# Patient Record
Sex: Female | Born: 1960 | Race: White | Hispanic: No | Marital: Married | Smoking: Never smoker
Health system: Southern US, Community
[De-identification: ages and names within clinical notes are randomized; demographics above are authoritative.]

## PROBLEM LIST (undated history)

## (undated) DIAGNOSIS — B029 Zoster without complications: Secondary | ICD-10-CM

## (undated) DIAGNOSIS — R42 Dizziness and giddiness: Secondary | ICD-10-CM

## (undated) DIAGNOSIS — C50919 Malignant neoplasm of unspecified site of unspecified female breast: Secondary | ICD-10-CM

## (undated) DIAGNOSIS — R87619 Unspecified abnormal cytological findings in specimens from cervix uteri: Secondary | ICD-10-CM

## (undated) DIAGNOSIS — R7309 Other abnormal glucose: Secondary | ICD-10-CM

## (undated) DIAGNOSIS — IMO0002 Reserved for concepts with insufficient information to code with codable children: Secondary | ICD-10-CM

## (undated) DIAGNOSIS — Z800 Family history of malignant neoplasm of digestive organs: Secondary | ICD-10-CM

## (undated) DIAGNOSIS — G43909 Migraine, unspecified, not intractable, without status migrainosus: Secondary | ICD-10-CM

## (undated) DIAGNOSIS — K222 Esophageal obstruction: Secondary | ICD-10-CM

## (undated) DIAGNOSIS — M26609 Unspecified temporomandibular joint disorder, unspecified side: Secondary | ICD-10-CM

## (undated) DIAGNOSIS — K219 Gastro-esophageal reflux disease without esophagitis: Secondary | ICD-10-CM

## (undated) DIAGNOSIS — M8580 Other specified disorders of bone density and structure, unspecified site: Secondary | ICD-10-CM

## (undated) DIAGNOSIS — M810 Age-related osteoporosis without current pathological fracture: Secondary | ICD-10-CM

## (undated) HISTORY — DX: Migraine, unspecified, not intractable, without status migrainosus: G43.909

## (undated) HISTORY — DX: Unspecified temporomandibular joint disorder, unspecified side: M26.609

## (undated) HISTORY — PX: GYNECOLOGIC CRYOSURGERY: SHX857

## (undated) HISTORY — DX: Dizziness and giddiness: R42

## (undated) HISTORY — DX: Unspecified abnormal cytological findings in specimens from cervix uteri: R87.619

## (undated) HISTORY — DX: Age-related osteoporosis without current pathological fracture: M81.0

## (undated) HISTORY — PX: WISDOM TOOTH EXTRACTION: SHX21

## (undated) HISTORY — DX: Other specified disorders of bone density and structure, unspecified site: M85.80

## (undated) HISTORY — DX: Zoster without complications: B02.9

## (undated) HISTORY — DX: Malignant neoplasm of unspecified site of unspecified female breast: C50.919

## (undated) HISTORY — DX: Esophageal obstruction: K22.2

## (undated) HISTORY — PX: ESOPHAGEAL DILATION: SHX303

## (undated) HISTORY — DX: Reserved for concepts with insufficient information to code with codable children: IMO0002

## (undated) HISTORY — DX: Family history of malignant neoplasm of digestive organs: Z80.0

## (undated) HISTORY — DX: Other abnormal glucose: R73.09

---

## 1989-10-12 HISTORY — PX: DILATION AND CURETTAGE OF UTERUS: SHX78

## 1990-10-12 HISTORY — PX: LAPAROSCOPIC CHOLECYSTECTOMY: SUR755

## 1997-12-28 ENCOUNTER — Other Ambulatory Visit: Admission: RE | Admit: 1997-12-28 | Discharge: 1997-12-28 | Payer: Self-pay | Admitting: Obstetrics and Gynecology

## 2002-04-19 ENCOUNTER — Other Ambulatory Visit: Admission: RE | Admit: 2002-04-19 | Discharge: 2002-04-19 | Payer: Self-pay | Admitting: Obstetrics and Gynecology

## 2003-05-24 ENCOUNTER — Other Ambulatory Visit: Admission: RE | Admit: 2003-05-24 | Discharge: 2003-05-24 | Payer: Self-pay | Admitting: Obstetrics and Gynecology

## 2004-06-17 ENCOUNTER — Other Ambulatory Visit: Admission: RE | Admit: 2004-06-17 | Discharge: 2004-06-17 | Payer: Self-pay | Admitting: Obstetrics and Gynecology

## 2005-08-06 ENCOUNTER — Other Ambulatory Visit: Admission: RE | Admit: 2005-08-06 | Discharge: 2005-08-06 | Payer: Self-pay | Admitting: Obstetrics and Gynecology

## 2005-08-24 ENCOUNTER — Encounter: Admission: RE | Admit: 2005-08-24 | Discharge: 2005-08-24 | Payer: Self-pay | Admitting: Obstetrics and Gynecology

## 2005-08-28 ENCOUNTER — Ambulatory Visit (HOSPITAL_COMMUNITY): Admission: RE | Admit: 2005-08-28 | Discharge: 2005-08-28 | Payer: Self-pay | Admitting: Gastroenterology

## 2008-02-23 ENCOUNTER — Other Ambulatory Visit: Admission: RE | Admit: 2008-02-23 | Discharge: 2008-02-23 | Payer: Self-pay | Admitting: Obstetrics & Gynecology

## 2010-09-26 ENCOUNTER — Encounter
Admission: RE | Admit: 2010-09-26 | Discharge: 2010-09-26 | Payer: Self-pay | Source: Home / Self Care | Attending: Gastroenterology | Admitting: Gastroenterology

## 2010-10-12 HISTORY — PX: BREAST BIOPSY: SHX20

## 2010-11-01 ENCOUNTER — Encounter: Payer: Self-pay | Admitting: Gastroenterology

## 2011-02-18 ENCOUNTER — Other Ambulatory Visit: Payer: Self-pay | Admitting: Chiropractic Medicine

## 2011-02-18 DIAGNOSIS — M25511 Pain in right shoulder: Secondary | ICD-10-CM

## 2011-02-21 ENCOUNTER — Ambulatory Visit
Admission: RE | Admit: 2011-02-21 | Discharge: 2011-02-21 | Disposition: A | Discharge status: Home / Self Care | Payer: BC Managed Care – PPO | Source: Ambulatory Visit | Attending: Chiropractic Medicine | Admitting: Chiropractic Medicine

## 2011-02-21 DIAGNOSIS — M25511 Pain in right shoulder: Secondary | ICD-10-CM

## 2013-07-07 ENCOUNTER — Encounter: Payer: Self-pay | Admitting: Obstetrics & Gynecology

## 2013-07-17 ENCOUNTER — Encounter: Payer: Self-pay | Admitting: Obstetrics & Gynecology

## 2013-07-18 ENCOUNTER — Encounter: Payer: Self-pay | Admitting: Obstetrics & Gynecology

## 2013-07-18 ENCOUNTER — Ambulatory Visit (INDEPENDENT_AMBULATORY_CARE_PROVIDER_SITE_OTHER): Payer: BC Managed Care – PPO | Admitting: Obstetrics & Gynecology

## 2013-07-18 VITALS — BP 120/78 | HR 60 | Resp 16 | Ht 65.75 in | Wt 136.0 lb

## 2013-07-18 DIAGNOSIS — F411 Generalized anxiety disorder: Secondary | ICD-10-CM

## 2013-07-18 DIAGNOSIS — Z8249 Family history of ischemic heart disease and other diseases of the circulatory system: Secondary | ICD-10-CM

## 2013-07-18 LAB — COMPREHENSIVE METABOLIC PANEL
ALT: 14 U/L (ref 0–35)
AST: 15 U/L (ref 0–37)
Alkaline Phosphatase: 62 U/L (ref 39–117)
BUN: 17 mg/dL (ref 6–23)
CO2: 29 mEq/L (ref 19–32)
Calcium: 9.8 mg/dL (ref 8.4–10.5)
Chloride: 103 mEq/L (ref 96–112)
Creat: 0.71 mg/dL (ref 0.50–1.10)
Glucose, Bld: 95 mg/dL (ref 70–99)
Potassium: 4.5 mEq/L (ref 3.5–5.3)
Total Bilirubin: 0.4 mg/dL (ref 0.3–1.2)
Total Protein: 6.8 g/dL (ref 6.0–8.3)

## 2013-07-18 LAB — LIPID PANEL
Cholesterol: 185 mg/dL (ref 0–200)
HDL: 69 mg/dL (ref >39)
LDL Cholesterol: 98 mg/dL (ref 0–99)
Total CHOL/HDL Ratio: 2.7 Ratio
Triglycerides: 92 mg/dL (ref <150)
VLDL: 18 mg/dL (ref 0–40)

## 2013-07-18 LAB — TSH: TSH: 1.518 u[IU]/mL (ref 0.350–4.500)

## 2013-07-18 MED ORDER — TEMAZEPAM 15 MG PO CAPS: 15.0000 mg | ORAL_CAPSULE | Freq: Every evening | ORAL | Status: DC | PRN

## 2013-07-18 NOTE — Patient Instructions (Signed)
Please call in 1-2 weeks and give me a report about the medication.

## 2013-07-18 NOTE — Progress Notes (Signed)
52 y.o. Married Caucasian female G7P4 here to discuss several new issues that are becoming more problematic over the last several months.  Since going into menopause, in 2013, she has been having more issues with anxiety.  This, at times, feels like heart fluttering or that "funny feeling" in the abdomen.  It is causing her increased insomnia.  Hot flashes and night sweats are better.  She does feel like she is hotter at night but, overall, those menopausal symptoms are much improved.  She has talked with several friends and she has several who are on medications.  She has taken OTC benadryl but it makes her feel so groggy in the morning.  Also, she reports exercising at a gym recently with a friend and the monitor showed her pulse after just a few minutes on a stair climber was ~165-170.  Her friend was very anxious for her so asked her to stop.  Pt had no chest pain or shortness of breath.  She is not a smoker.  She does have family hx and has a female cousin in her 40's--otherwise very healthy--who just had a heart attack.  Pt has never had an EKG.  She is wondering what to do next.  We discussed treatment options for hormonal symptoms, anxiety, and insomnia including HRT, SSRI's, benzodiazepines and sleep medications including common side effects.  She only wants to start one medication.  Her biggest concern is the sleep.  I feel a trial of restoril 15mg  is appropriate.  She would prefer something that she doesn't take every day.  O: Healthy WD,WN female Affect: norma  A:Anxiety, Insomnia, family hx of cardiovascular disease  P:CMP,  lipids today Trial of Restoril 15mg  qhs.  #30/1RF.  Pt to give update 2 weeks Referral to cardiology  ~15 minutes spent with patient >50% of time was in face to face discussion of above.

## 2013-08-09 ENCOUNTER — Ambulatory Visit: Payer: BC Managed Care – PPO | Admitting: Cardiovascular Disease

## 2013-08-15 DIAGNOSIS — K222 Esophageal obstruction: Secondary | ICD-10-CM | POA: Insufficient documentation

## 2013-08-15 DIAGNOSIS — G43109 Migraine with aura, not intractable, without status migrainosus: Secondary | ICD-10-CM | POA: Insufficient documentation

## 2013-08-15 DIAGNOSIS — M26609 Unspecified temporomandibular joint disorder, unspecified side: Secondary | ICD-10-CM | POA: Insufficient documentation

## 2013-08-15 DIAGNOSIS — R87619 Unspecified abnormal cytological findings in specimens from cervix uteri: Secondary | ICD-10-CM | POA: Insufficient documentation

## 2013-08-15 DIAGNOSIS — G43909 Migraine, unspecified, not intractable, without status migrainosus: Secondary | ICD-10-CM | POA: Insufficient documentation

## 2013-08-15 DIAGNOSIS — IMO0002 Reserved for concepts with insufficient information to code with codable children: Secondary | ICD-10-CM | POA: Insufficient documentation

## 2013-08-15 DIAGNOSIS — R42 Dizziness and giddiness: Secondary | ICD-10-CM | POA: Insufficient documentation

## 2013-08-16 ENCOUNTER — Ambulatory Visit (INDEPENDENT_AMBULATORY_CARE_PROVIDER_SITE_OTHER): Payer: BC Managed Care – PPO | Admitting: Cardiovascular Disease

## 2013-08-16 ENCOUNTER — Encounter: Payer: Self-pay | Admitting: Cardiovascular Disease

## 2013-08-16 VITALS — BP 102/72 | HR 72 | Ht 65.5 in | Wt 134.5 lb

## 2013-08-16 DIAGNOSIS — R000 Tachycardia, unspecified: Secondary | ICD-10-CM

## 2013-08-16 DIAGNOSIS — R0789 Other chest pain: Secondary | ICD-10-CM

## 2013-08-16 DIAGNOSIS — Z8249 Family history of ischemic heart disease and other diseases of the circulatory system: Secondary | ICD-10-CM

## 2013-08-16 NOTE — Progress Notes (Signed)
Patient ID: Tiffany Nash, female   DOB: [birth year], 52 y.o.   MRN: [id number]  52 yo with family history of CAD.  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  Has at gym and using stair stepper and had some tachycardia and HR;s quickly into the 160-170 range.  Mild dyspnea Denies chest pain.  Some insomnia and restlessness.  Denies anxiety or depression.  Non smoker.  No syncope.  Baseline ECG is normal.  Has not had previous stress test.  She still works out 2-3 x / week Good aerobic capacity LDL 98 10/14  TSH also normal in October and no anemia   ROS: Denies fever, malais, weight loss, blurry vision, decreased visual acuity, cough, sputum, SOB, hemoptysis, pleuritic pain, palpitaitons, heartburn, abdominal pain, melena, lower extremity edema, claudication, or rash.  All other systems reviewed and negative   General: Affect appropriate Healthy:  appears stated age HEENT: normal Neck supple with no adenopathy JVP normal no bruits no thyromegaly Lungs clear with no wheezing and good diaphragmatic motion Heart:  S1/S2 no murmur,rub, gallop or click PMI normal Abdomen: benighn, BS positve, no tenderness, no AAA no bruit.  No HSM or HJR Distal pulses intact with no bruits No edema Neuro non-focal Skin warm and dry No muscular weakness  Medications Current Outpatient Prescriptions  Medication Sig Dispense Refill  . Acetaminophen (TYLENOL PO) Take 1 tablet by mouth as needed.       . famotidine (PEPCID) 20 MG tablet Take 20 mg by mouth as needed.       Marland Kitchen ibuprofen (ADVIL,MOTRIN) 800 MG tablet Take 800 mg by mouth as needed for pain.      . Nutritional Supplements (JUICE PLUS FIBRE PO) Take by mouth.      . Probiotic Product (PROBIOTIC PO) Take 1 capsule by mouth daily.       . SUMAtriptan (IMITREX) 100 MG tablet Take 100 mg by mouth every 2 (two) hours as needed for migraine. May repeat in 2 hours if headache persists or recurs.      . temazepam (RESTORIL) 15  MG capsule Take 15 mg by mouth at bedtime as needed for sleep. Pt has been taking this every night       No current facility-administered medications for this visit.    Allergies Penicillins and Tylox  Family History: Family History  Problem Relation Age of Onset  . Diabetes Father   . Goiter Mother   . Heart disease      Social History: History   Social History  . Marital Status: Married    Spouse Name: N/A    Number of Children: N/A  . Years of Education: N/A   Occupational History  . Not on file.   Social History Main Topics  . Smoking status: Never Smoker   . Smokeless tobacco: Never Used  . Alcohol Use: Yes     Comment: occ  . Drug Use: No  . Sexual Activity: Not on file   Other Topics Concern  . Not on file   Social History Narrative  . No narrative on file    Electrocardiogram:  SR rate 72 normal   Assessment and Plan

## 2013-08-16 NOTE — Assessment & Plan Note (Signed)
Discussed calcium score with patient She will f/u if she wants it Cholesterol ok and no other risk factors

## 2013-08-16 NOTE — Assessment & Plan Note (Signed)
Not clear that there is any arrhythmia  Will order ETT to r/o CAD given family history and patient concern As well as see what HR and BP response is to exercise in monitored setting

## 2013-08-16 NOTE — Patient Instructions (Signed)
Your physician recommends that you schedule a follow-up appointment in:  AS NEEDED  Your physician recommends that you continue on your current medications as directed. Please refer to the Current Medication list given to you today.  Your physician has requested that you have an exercise tolerance test. For further information please visit [email or web address] Please also follow instruction sheet, as given.    WILL CALL IF  DECIDES TO  DO  CALCIUM SCORE    OUT OF POCKET  $150.00

## 2013-08-17 ENCOUNTER — Other Ambulatory Visit: Payer: Self-pay

## 2013-08-22 ENCOUNTER — Telehealth: Payer: Self-pay | Admitting: Obstetrics & Gynecology

## 2013-08-22 MED ORDER — TEMAZEPAM 15 MG PO CAPS: 15.0000 mg | ORAL_CAPSULE | Freq: Every day | ORAL | Status: DC

## 2013-08-22 NOTE — Telephone Encounter (Signed)
Rx done.  Will need to be faxed. 

## 2013-08-22 NOTE — Telephone Encounter (Signed)
Patient needs a refill of temazepam (RESTORIL) 15 MG capsule   [facility] [address] [phone]

## 2013-08-22 NOTE — Telephone Encounter (Signed)
Spoke with patient Tiffany Nash on temazepam. States she is using this pretty much every night. States is feeling really well and rested with it. Tried to stop for a couple of nights and could not sleep at all. She did not realize Dr Hyacinth Meeker had written #30/1RF, so she will call the pharmacy to refill it, but will not see [place] again until her AEX on 12/30/13. Please advise if she can continue to take it like she is and if so will need refills sent to the pharmacy.

## 2013-09-25 ENCOUNTER — Encounter: Payer: BC Managed Care – PPO | Admitting: Physician Assistant

## 2013-10-24 ENCOUNTER — Ambulatory Visit (INDEPENDENT_AMBULATORY_CARE_PROVIDER_SITE_OTHER): Payer: BC Managed Care – PPO | Admitting: Physician Assistant

## 2013-10-24 DIAGNOSIS — R0789 Other chest pain: Secondary | ICD-10-CM

## 2013-10-24 NOTE — Progress Notes (Signed)
Exercise Treadmill Test  Pre-Exercise Testing Evaluation Rhythm: normal sinus  Rate: 75     Test  Exercise Tolerance Test Ordering MD: Jenkins Rouge, MD  Interpreting MD: Richardson Dopp, PA-C  Unique Test No: 1  Treadmill:  1  Indication for ETT: chest pain - rule out ischemia  Contraindication to ETT: No   Stress Modality: exercise - treadmill  Cardiac Imaging Performed: non   Protocol: standard Bruce - maximal  Max BP:  185/70  Max MPHR (bpm):  167 85% MPR (bpm):  142  MPHR obtained (bpm):  162 % MPHR obtained:  97  Reached 85% MPHR (min:sec):  7:08 Total Exercise Time (min-sec):  10:30  Workload in METS:  12.5 Borg Scale: 17  Reason ETT Terminated:  patient's desire to stop    ST Segment Analysis At Rest: normal ST segments - no evidence of significant ST depression With Exercise: no evidence of significant ST depression  Other Information Arrhythmia:  No Angina during ETT:  absent (0) Quality of ETT:  diagnostic  ETT Interpretation:  normal - no evidence of ischemia by ST analysis  Comments: Good exercise capacity. No chest pain. Normal BP response to exercise. No ST changes to suggest ischemia.  No arrhythmias.   Recommendations: F/u with Dr. Jenkins Rouge as directed. Signed, Richardson Dopp, PA-C   10/24/2013 2:50 PM

## 2013-10-26 NOTE — Progress Notes (Signed)
PT AWARE STRESS RESULTS./CY

## 2013-12-25 ENCOUNTER — Other Ambulatory Visit: Payer: Self-pay | Admitting: *Deleted

## 2013-12-25 MED ORDER — SUMATRIPTAN SUCCINATE 100 MG PO TABS: 100.0000 mg | ORAL_TABLET | ORAL | Status: DC | PRN

## 2013-12-25 NOTE — Telephone Encounter (Signed)
Incoming fax form [facility] requesting Sumatriptan 100 mg  Last AEX 09/29/2012 Last refill 12/14/2012 #27/ 1 year Next appt 01/05/2014  Please approve or deny rx.

## 2014-01-03 ENCOUNTER — Ambulatory Visit: Payer: Self-pay | Admitting: Obstetrics & Gynecology

## 2014-01-05 ENCOUNTER — Encounter: Payer: Self-pay | Admitting: Obstetrics & Gynecology

## 2014-01-05 ENCOUNTER — Ambulatory Visit (INDEPENDENT_AMBULATORY_CARE_PROVIDER_SITE_OTHER): Payer: BC Managed Care – PPO | Admitting: Obstetrics & Gynecology

## 2014-01-05 VITALS — BP 100/60 | HR 60 | Resp 16 | Ht 65.75 in | Wt 132.4 lb

## 2014-01-05 DIAGNOSIS — Z01419 Encounter for gynecological examination (general) (routine) without abnormal findings: Secondary | ICD-10-CM

## 2014-01-05 DIAGNOSIS — Z124 Encounter for screening for malignant neoplasm of cervix: Secondary | ICD-10-CM

## 2014-01-05 DIAGNOSIS — Z0000 Encounter for general adult medical examination without abnormal findings: Secondary | ICD-10-CM

## 2014-01-05 LAB — POCT URINALYSIS DIPSTICK
Bilirubin, UA: NEGATIVE
Glucose, UA: NEGATIVE
Ketones, UA: NEGATIVE
Leukocytes, UA: NEGATIVE
Nitrite, UA: NEGATIVE
Protein, UA: NEGATIVE
RBC UA: NEGATIVE
Urobilinogen, UA: NEGATIVE
pH, UA: 7

## 2014-01-05 LAB — COMPREHENSIVE METABOLIC PANEL
ALT: 15 U/L (ref 0–35)
AST: 17 U/L (ref 0–37)
Albumin: 4.5 g/dL (ref 3.5–5.2)
Alkaline Phosphatase: 61 U/L (ref 39–117)
BUN: 13 mg/dL (ref 6–23)
CHLORIDE: 99 meq/L (ref 96–112)
CO2: 31 mEq/L (ref 19–32)
Calcium: 9.8 mg/dL (ref 8.4–10.5)
Creat: 0.74 mg/dL (ref 0.50–1.10)
Glucose, Bld: 87 mg/dL (ref 70–99)
Potassium: 4 mEq/L (ref 3.5–5.3)
SODIUM: 139 meq/L (ref 135–145)
TOTAL PROTEIN: 6.9 g/dL (ref 6.0–8.3)
Total Bilirubin: 0.6 mg/dL (ref 0.2–1.2)

## 2014-01-05 LAB — LIPID PANEL
Cholesterol: 206 mg/dL — ABNORMAL HIGH (ref 0–200)
HDL: 71 mg/dL (ref >39)
LDL Cholesterol: 117 mg/dL — ABNORMAL HIGH (ref 0–99)
Total CHOL/HDL Ratio: 2.9 Ratio
Triglycerides: 89 mg/dL (ref <150)
VLDL: 18 mg/dL (ref 0–40)

## 2014-01-05 LAB — HEMOGLOBIN, FINGERSTICK: Hemoglobin, fingerstick: 12.9 g/dL (ref 12.0–16.0)

## 2014-01-05 NOTE — Patient Instructions (Signed)

## 2014-01-05 NOTE — Progress Notes (Signed)
53 y.o. Tiffany Nash MarriedCaucasianF here for annual exam.  Doing better.  Used resotril for sleep.  Has helped a lot.  Would like to have RF on hand.  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   Husband had HPV related throat cancer.  He is doing really well.    Patient's last menstrual period was 05/12/2012.          Sexually active: yes  The current method of family planning is post menopausal status.    Exercising: yes  walking, trainer for upper body conditioning Smoker:  no  Health Maintenance: Pap:  09/02/11-WNL/negative HR HPV History of abnormal Pap:  no MMG:  09/20/12-normal Colonoscopy:  2012-repeat in 10 years BMD:   ? 2001/02 heel test-normal TDaP:  ? 3-4 years ago Screening Labs: today, Hb today: 12.9, Urine today: PH-7.0   reports that she has never smoked. She has never used smokeless tobacco. She reports that she drinks about 1.0 ounces of alcohol per week. She reports that she does not use illicit drugs.  Past Medical History  Diagnosis Date  . Migraine   . TMJ (temporomandibular joint syndrome)   . Abnormal Pap smear   . Schatzki's ring     and hiatal hernia on EGD  . Vertigo     Past Surgical History  Procedure Laterality Date  . Dilation and curettage of uterus  1991  . Laparoscopic cholecystectomy  1992  . Gynecologic cryosurgery  1980s  . Breast biopsy  1/12    fibrocystic changes   . Esophageal dilation      Current Outpatient Prescriptions  Medication Sig Dispense Refill  . Acetaminophen (TYLENOL PO) Take 1 tablet by mouth as needed.       . famotidine (PEPCID) 20 MG tablet Take 20 mg by mouth as needed.       Marland Kitchen ibuprofen (ADVIL,MOTRIN) 800 MG tablet Take 800 mg by mouth as needed for pain.      . Nutritional Supplements (JUICE PLUS FIBRE PO) Take by mouth.      . SUMAtriptan (IMITREX) 100 MG tablet Take 1 tablet (100 mg total) by mouth every 2 (two) hours as needed for migraine. May repeat in 2 hours if  headache persists or recurs.  9 tablet  3  . temazepam (RESTORIL) 15 MG capsule Take 1 capsule (15 mg total) by mouth at bedtime.  30 capsule  3   No current facility-administered medications for this visit.    Family History  Problem Relation Age of Onset  . Diabetes Father   . Goiter Mother   . Heart disease      ROS:  Pertinent items are noted in HPI.  Otherwise, a comprehensive ROS was negative.  Exam:   BP 100/60  Pulse 60  Resp 16  Ht 5' 5.75" (1.67 m)  Wt 132 lb 6.4 oz (60.056 kg)  BMI 21.53 kg/m2  LMP 05/12/2012  Weight change: -8lb   Height: 5' 5.75" (167 cm)  Ht Readings from Last 3 Encounters:  01/05/14 5' 5.75" (1.67 m)  08/16/13 5' 5.5" (1.664 m)  07/18/13 5' 5.75" (1.67 m)    General appearance: alert, cooperative and appears stated age Head: Normocephalic, without obvious abnormality, atraumatic Neck: no adenopathy, supple, symmetrical, trachea midline and thyroid normal to inspection and palpation Lungs: clear to auscultation bilaterally Breasts: normal appearance, no masses or tenderness Heart: regular rate and rhythm Abdomen: soft, non-tender; bowel sounds normal; no masses,  no organomegaly Extremities: extremities normal, atraumatic, no cyanosis or edema Skin: Skin color, texture, turgor normal. No rashes or lesions Lymph nodes: Cervical, supraclavicular, and axillary nodes normal. No abnormal inguinal nodes palpated Neurologic: Grossly normal   Pelvic: External genitalia:  no lesions              Urethra:  normal appearing urethra with no masses, tenderness or lesions              Bartholins and Skenes: normal                 Vagina: normal appearing vagina with normal color and discharge, no lesions              Cervix: no lesions              Pap taken: yes Bimanual Exam:  Uterus:  normal size, contour, position, consistency, mobility, non-tender              Adnexa: normal adnexa and no mass, fullness, tenderness               Rectovaginal:  Confirms               Anus:  normal sphincter tone, no lesions  A:  Well Woman with normal exam PMP, no HRT Spouse with HPV related throat cancer Recent anxiety/insomnia that is improved.  P:   Mammogram due.  Pt aware and will schedule.  Declines having me do it. pap smear only today.  HPV testing neg 11/12.  Will repeat HR HPV next year due to spouse's disease. Pt does not need rx for restoril but will call if does. return annually or prn  An After Visit Summary was printed and given to the patient.

## 2014-01-06 LAB — VITAMIN D 25 HYDROXY (VIT D DEFICIENCY, FRACTURES): Vit D, 25-Hydroxy: 43 ng/mL (ref 30–89)

## 2014-01-06 LAB — TSH: TSH: 0.959 u[IU]/mL (ref 0.350–4.500)

## 2014-01-08 LAB — IPS PAP SMEAR ONLY

## 2014-01-18 ENCOUNTER — Other Ambulatory Visit: Payer: Self-pay | Admitting: Obstetrics & Gynecology

## 2014-01-18 NOTE — Telephone Encounter (Signed)
Last AEX 01/05/14 Last refill 08/22/2104 #30/3 refills  Will refill Rx - Dr. Sabra Heck AEX 01/05/14 note.

## 2014-03-12 ENCOUNTER — Telehealth: Payer: Self-pay | Admitting: Obstetrics & Gynecology

## 2014-03-12 NOTE — Telephone Encounter (Signed)
Spoke with patient. Patient states that she noticed over memorial day weekend that her breasts were "tender". "It feels like when I was going to have a period and now my nipples are sore." Patient also states that she is having vaginal itchiness and dryness that has been going on since memorial day as well. "I have had my share of yeast infections and I don't think this is related." Advised would like patient to come in to be seen with Dr.Miller for evaluation. Patient agreeable. Advised would speak with provider about best appointment availability and give patient a call back. Patient agreeable.

## 2014-03-12 NOTE — Telephone Encounter (Signed)
Patient calling to speak with nurse about bilateral breast tenderness. Patient is post menopausal and also having "itchiness and dryness down there." Please advise?  [place]

## 2014-03-12 NOTE — Telephone Encounter (Signed)
Spoke with patient. Advised of appointment availability tomorrow at 12:30pm with Dr.Miller (time per Gay Filler). Patient agreeable. Appointment scheduled.  Routing to provider for final review. Patient agreeable to disposition. Will close encounter

## 2014-03-12 NOTE — Telephone Encounter (Signed)
Left message to call [place] at [phone].  Offer appointment for tomorrow at 12:30 with Dr.Miller. If not available offer to see another provider (per Gay Filler).

## 2014-03-13 ENCOUNTER — Encounter: Payer: Self-pay | Admitting: Obstetrics & Gynecology

## 2014-03-13 ENCOUNTER — Ambulatory Visit (INDEPENDENT_AMBULATORY_CARE_PROVIDER_SITE_OTHER): Payer: BC Managed Care – PPO | Admitting: Obstetrics & Gynecology

## 2014-03-13 VITALS — BP 100/56 | HR 64 | Resp 16 | Ht 65.75 in | Wt 133.0 lb

## 2014-03-13 DIAGNOSIS — R1031 Right lower quadrant pain: Secondary | ICD-10-CM

## 2014-03-13 DIAGNOSIS — R922 Inconclusive mammogram: Secondary | ICD-10-CM

## 2014-03-13 DIAGNOSIS — N644 Mastodynia: Secondary | ICD-10-CM

## 2014-03-13 NOTE — Progress Notes (Signed)
Subjective:     Patient ID: Tiffany Nash, female   DOB: 08-07-1961, 53 y.o.   MRN: [id number]  HPI 53 yo G7P4 here for about a week complaint of increased breast tenderness and lower right pelvic pain.  The pain is intermittent and feels like little "twinges".  Has a little acne over the past day or two.  Also feeling a little more emotionally fragile the last week or so.  No vaginal discharge.    Last MMG 12/13.  Has appointment tomorrow for MMG.    Review of Systems  All other systems reviewed and are negative.      Objective:   Physical Exam  Constitutional: She is oriented to person, place, and time. She appears well-developed and well-nourished.  Neck: Normal range of motion. Neck supple. No thyromegaly present.  Pulmonary/Chest: Right breast exhibits tenderness (diffuse). Right breast exhibits no inverted nipple, no mass, no nipple discharge and no skin change. Left breast exhibits tenderness (diffuse). Left breast exhibits no inverted nipple, no mass, no nipple discharge and no skin change. Breasts are symmetrical.  Abdominal: Soft. Bowel sounds are normal.  Genitourinary: Vagina normal and uterus normal. Cervix exhibits no discharge. Right adnexum displays no mass and no tenderness. Left adnexum displays no mass and no tenderness.  Lymphadenopathy:    She has no cervical adenopathy.       Right: No inguinal adenopathy present.       Left: No inguinal adenopathy present.  Neurological: She is alert and oriented to person, place, and time.  Skin: Skin is warm and dry.  Psychiatric: She has a normal mood and affect.       Assessment:     Breast tenderness RLQ pain  Dense breasts    Plan:     Estradiol and FSH today.   MMG scheduled for tomorrow.  Pt will call and change to 3D due to breast density

## 2014-03-14 LAB — FOLLICLE STIMULATING HORMONE: FSH: 21.5 m[IU]/mL

## 2014-03-14 LAB — ESTRADIOL: Estradiol: 79.1 pg/mL

## 2014-06-05 ENCOUNTER — Other Ambulatory Visit: Payer: Self-pay

## 2014-06-05 NOTE — Telephone Encounter (Signed)
Last AEX: 01/05/14 Last refill:12/25/13 # 9, 3 refs Current AEX: 01/11/15  Please advise

## 2014-06-08 ENCOUNTER — Telehealth: Payer: Self-pay | Admitting: Obstetrics & Gynecology

## 2014-06-08 MED ORDER — SUMATRIPTAN SUCCINATE 100 MG PO TABS: 100.0000 mg | ORAL_TABLET | ORAL | Status: DC | PRN

## 2014-06-08 NOTE — Telephone Encounter (Signed)
Spoke with pharmacy tech at [facility] who states that order was received. Spoke with patient. Advised of rx sent to pharmacy on file. Patient agreeable and verbalizes understanding.  Routing to provider for final review. Patient agreeable to disposition. Will close encounter

## 2014-06-08 NOTE — Telephone Encounter (Signed)
Refill request as seen below was denied on 8/25. Patient is calling stating that she needs these refills for her migraines. Spoke with pharmacy patient last filled rx on 7/19 and rx had no refills left at that time.   Denyse Dago, CMA at 06/05/2014 4:07 PM     Status: Signed        Last AEX: 01/05/14  Last refill:12/25/13 # 9, 3 refs  Current AEX: 01/11/15  Please advise  SUMAtriptan (IMITREX) 100 MG tablet 9 tablet 0 06/05/2014 Sig - Route: Take 1 tablet (100 mg total) by mouth every 2 (two) hours as needed for migraine. May repeat in 2 hours if headache persists or recurs. - Oral Class: Normal DAW: No Reason for Refusal: Refill not appropriate Refused By: Regina Eck, CNM  Routing to Dr.Silva for review and advise as Regina Eck CNM is out of the office.

## 2014-06-08 NOTE — Telephone Encounter (Signed)
I just did this electronically.  Please call pharmacy to ensure they receive it.  I have been doing this rx for pt for years.  Can let pt know is done.  Thank you.

## 2014-06-08 NOTE — Telephone Encounter (Signed)
Will route to Dr. Sabra Heck.  This is her patient.

## 2014-06-08 NOTE — Telephone Encounter (Signed)
Patient requested a refill of her migraine medication and it was denied. Patient says she needs the refills Please call.

## 2014-08-13 ENCOUNTER — Encounter: Payer: Self-pay | Admitting: Obstetrics & Gynecology

## 2014-08-16 ENCOUNTER — Other Ambulatory Visit: Payer: Self-pay | Admitting: *Deleted

## 2014-08-16 MED ORDER — TEMAZEPAM 15 MG PO CAPS: ORAL_CAPSULE | ORAL | Status: DC

## 2014-08-16 NOTE — Telephone Encounter (Signed)
Fax From: [facility] for Temazepam 15 mg  Last Refilled: 01/18/14 #30/3 rfs by Dr. Sabra Heck  Last AEX: 01/05/14 with Dr. Sabra Heck  Aex Scheduled: 01/11/15 with Dr. Sabra Heck  Please advise.

## 2014-08-17 NOTE — Telephone Encounter (Signed)
Rx has been faxed to pharmacy.

## 2014-11-28 ENCOUNTER — Other Ambulatory Visit: Payer: Self-pay | Admitting: Obstetrics & Gynecology

## 2014-11-28 NOTE — Telephone Encounter (Signed)
Medication refill request: Imitrex 100 mg Last AEX:  01/05/14 SM Next AEX: 01/11/15 SM Last MMG (if hormonal medication request): 03/19/14 [id number]:Neg Refill authorized: 06/08/14 #9/3. Today?

## 2015-01-11 ENCOUNTER — Encounter: Payer: Self-pay | Admitting: Obstetrics & Gynecology

## 2015-01-11 ENCOUNTER — Ambulatory Visit (INDEPENDENT_AMBULATORY_CARE_PROVIDER_SITE_OTHER): Payer: BLUE CROSS/BLUE SHIELD | Admitting: Obstetrics & Gynecology

## 2015-01-11 VITALS — BP 100/58 | HR 56 | Resp 12 | Ht 65.5 in | Wt 138.0 lb

## 2015-01-11 DIAGNOSIS — Z01419 Encounter for gynecological examination (general) (routine) without abnormal findings: Secondary | ICD-10-CM | POA: Diagnosis not present

## 2015-01-11 DIAGNOSIS — Z124 Encounter for screening for malignant neoplasm of cervix: Secondary | ICD-10-CM

## 2015-01-11 DIAGNOSIS — Z0000 Encounter for general adult medical examination without abnormal findings: Secondary | ICD-10-CM

## 2015-01-11 LAB — POCT URINALYSIS DIPSTICK
Bilirubin, UA: NEGATIVE
Glucose, UA: NEGATIVE
Ketones, UA: NEGATIVE
NITRITE UA: NEGATIVE
PROTEIN UA: NEGATIVE
Urobilinogen, UA: NEGATIVE
pH, UA: 5

## 2015-01-11 NOTE — Addendum Note (Signed)
Addended by: Megan Salon on: 01/11/2015 02:52 PM   Modules accepted: Miquel Dunn

## 2015-01-11 NOTE — Progress Notes (Addendum)
54 y.o. Tiffany Nash MarriedCaucasianF here for annual exam.  Doing well.  Expecting a granddaughter in July.  Husband is doing well.  Denies vaginal bleeding.  Had a "lifeline" test showing some mild osteopenia.    PCP:  Dr. Dorthy Cooler.  Just had AEX two weeks ago and labs were all normal except her HbA1C was mildly elevated.    Patient's last menstrual period was 05/12/2012.          Sexually active: Yes.    The current method of family planning is post menopausal status.    Exercising: Yes.    walking, running, and weights Smoker:  no  Health Maintenance: Pap:  01/05/14 WNL History of abnormal Pap:  yes MMG:  03/19/14 3D-normal Colonoscopy:  2011-repeat in 10 years BMD:   3/16-some bone loss, was told to discuss Vitamin D TDaP:  Up to date Screening Labs: PCP, Hb today: PCP, Urine today: WBC-trace, RBC-trace   reports that she has never smoked. She has never used smokeless tobacco. She reports that she drinks about 1.0 oz of alcohol per week. She reports that she does not use illicit drugs.  Past Medical History  Diagnosis Date  . Migraine   . TMJ (temporomandibular joint syndrome)   . Abnormal Pap smear   . Schatzki's ring     and hiatal hernia on EGD  . Vertigo     Past Surgical History  Procedure Laterality Date  . Dilation and curettage of uterus  1991  . Laparoscopic cholecystectomy  1992  . Gynecologic cryosurgery  1980s  . Breast biopsy  1/12    fibrocystic changes   . Esophageal dilation      Current Outpatient Prescriptions  Medication Sig Dispense Refill  . Acetaminophen (TYLENOL PO) Take 1 tablet by mouth as needed.     . famotidine (PEPCID) 20 MG tablet Take 20 mg by mouth as needed.     Marland Kitchen ibuprofen (ADVIL,MOTRIN) 800 MG tablet Take 800 mg by mouth as needed for pain.    . Nutritional Supplements (JUICE PLUS FIBRE PO) Take by mouth.    . SUMAtriptan (IMITREX) 100 MG tablet TAKE 1 TABLET BY MOUTH EVERY 2 HOURS AS NEEDED FOR MIGRAINE MAY REPEAT IN 2 HOURS IF HEADACHE  PERSISTS OR RECURS 9 tablet 3  . temazepam (RESTORIL) 15 MG capsule TAKE 1 CAPSULE BY MOUTH AT BEDTIME (Patient not taking: Reported on 01/11/2015) 30 capsule 3   No current facility-administered medications for this visit.    Family History  Problem Relation Age of Onset  . Diabetes Father   . Goiter Mother   . Heart disease      ROS:  Pertinent items are noted in HPI.  Otherwise, a comprehensive ROS was negative.  Exam:   BP 100/58 mmHg  Pulse 56  Resp 12  Ht 5' 5.5" (1.664 m)  Wt 138 lb (62.596 kg)  BMI 22.61 kg/m2  LMP 05/12/2012   Height: 5' 5.5" (166.4 cm)  Ht Readings from Last 3 Encounters:  01/11/15 5' 5.5" (1.664 m)  03/13/14 5' 5.75" (1.67 m)  01/05/14 5' 5.75" (1.67 m)    General appearance: alert, cooperative and appears stated age Head: Normocephalic, without obvious abnormality, atraumatic Neck: no adenopathy, supple, symmetrical, trachea midline and thyroid normal to inspection and palpation Lungs: clear to auscultation bilaterally Breasts: normal appearance, no masses or tenderness Heart: regular rate and rhythm Abdomen: soft, non-tender; bowel sounds normal; no masses,  no organomegaly Extremities: extremities normal, atraumatic, no cyanosis or edema Skin:  Skin color, texture, turgor normal. No rashes or lesions Lymph nodes: Cervical, supraclavicular, and axillary nodes normal. No abnormal inguinal nodes palpated Neurologic: Grossly normal   Pelvic: External genitalia:  no lesions              Urethra:  normal appearing urethra with no masses, tenderness or lesions              Bartholins and Skenes: normal                 Vagina: normal appearing vagina with normal color and discharge, no lesions              Cervix: no lesions              Pap taken: No. Bimanual Exam:  Uterus:  normal size, contour, position, consistency, mobility, non-tender              Adnexa: normal adnexa and no mass, fullness, tenderness               Rectovaginal:  Confirms               Anus:  normal sphincter tone, no lesions  Chaperone was present for exam.  A:  Well Woman with normal exam PMP, no HRT Spouse with HPV related throat cancer Insomnia with rare Restoril use  P: Mammogram yearly. Pap and HR HPV today Pt does not need rx for restoril but will call if does. Labs with PCP. return annually or prn

## 2015-01-15 LAB — IPS PAP TEST WITH HPV

## 2015-03-01 ENCOUNTER — Ambulatory Visit (INDEPENDENT_AMBULATORY_CARE_PROVIDER_SITE_OTHER): Payer: BLUE CROSS/BLUE SHIELD | Admitting: Obstetrics & Gynecology

## 2015-03-01 ENCOUNTER — Telehealth: Payer: Self-pay | Admitting: Obstetrics & Gynecology

## 2015-03-01 VITALS — BP 98/60 | Temp 98.6°F | Resp 16 | Ht 65.5 in | Wt 139.8 lb

## 2015-03-01 DIAGNOSIS — R3915 Urgency of urination: Secondary | ICD-10-CM | POA: Diagnosis not present

## 2015-03-01 DIAGNOSIS — N3090 Cystitis, unspecified without hematuria: Secondary | ICD-10-CM

## 2015-03-01 LAB — POCT URINALYSIS DIPSTICK
Urobilinogen, UA: NEGATIVE
pH, UA: 5

## 2015-03-01 MED ORDER — SULFAMETHOXAZOLE-TRIMETHOPRIM 800-160 MG PO TABS: 1.0000 | ORAL_TABLET | Freq: Two times a day (BID) | ORAL | Status: DC

## 2015-03-01 NOTE — Progress Notes (Signed)
Subjective:     Patient ID: Tiffany Nash, female   DOB: Mar 31, 1961, 54 y.o.   MRN: [id number]  HPI 54 yo G7P3 MWF here for three day complaint of increased urinary urgency and vaginal pressure and right lower quadrant pain.  Feels she is emptying bladder and doesn't exactly have dysuria.  Just feels uncomfortable.  No pain with intercoursl  No fevers.  No gross hematuria.  No fevers.  No back pain.  Reports this is a new and unusual symptom for pt.  Dip u/a in office with WBCs.  Review of Systems  All other systems reviewed and are negative.      Objective:   Physical Exam  Constitutional: She appears well-developed and well-nourished.  Abdominal: Soft. Bowel sounds are normal. She exhibits no distension and no mass. There is no tenderness (mild lower abdominal tenderness). There is no rebound and no guarding. Hernia confirmed negative in the right inguinal area.  Genitourinary: Vagina normal and uterus normal. There is no rash, tenderness or lesion on the right labia. There is no rash, tenderness or lesion on the left labia. Cervix exhibits no motion tenderness, no discharge and no friability. Right adnexum displays no mass, no tenderness and no fullness. Left adnexum displays no mass, no tenderness and no fullness.  Lymphadenopathy:       Right: No inguinal adenopathy present.       Left: No inguinal adenopathy present.  Skin: Skin is warm and dry.  Psychiatric: She has a normal mood and affect.       Assessment:     Pelvic pressure WBCs in urine, possible UTI     Plan:     Urine culture and micro pending Bactrim DS bid x 5 days

## 2015-03-01 NOTE — Telephone Encounter (Signed)
Have her come now and put on nurse's schedule.  Will check urine and if not a clear UTI, I will see her.

## 2015-03-01 NOTE — Telephone Encounter (Signed)
Called patient, nurse visit for patient scheduled she is coming now and agreeable to plan. Routing to provider for final review. Patient agreeable to disposition. Will close encounter.

## 2015-03-01 NOTE — Telephone Encounter (Signed)
Patient is having some discomfort. Not sure if she has a bacterial infection.

## 2015-03-01 NOTE — Telephone Encounter (Signed)
Spoke with patient. Patient states that she has been experiencing vaginal "pressure" for 2 days with increased urination. Denies burning with urination, fever, and chills. Is experiencing some lower back pain but has been moving things in her home and unsure if this is the cause. Denies color change in urine. Denies vaginal discharge. Advised will need to be seen in office for further evaluation. Patient is agreeable. Patient is able to be seen today in office. Advised will speak with provider and return call with recommendations. Patient is agreeable.  Dr.Miller, no available appointments remain today. How would you like patient to proceed?

## 2015-03-01 NOTE — Patient Instructions (Signed)
You can use AZO (over the counter) up to three times daily as needed for urinary tract infection

## 2015-03-02 LAB — URINE CULTURE

## 2015-03-02 LAB — URINALYSIS, MICROSCOPIC ONLY
Bacteria, UA: NONE SEEN
CASTS: NONE SEEN
Crystals: NONE SEEN

## 2015-03-04 ENCOUNTER — Telehealth: Payer: Self-pay

## 2015-03-04 NOTE — Telephone Encounter (Signed)
Spoke with patient. Advised of results as seen below from [facility]. Patient is agreeable. "Then why were there WBC's in my urine?" Advised patient this could come from urine culture not being clean catch. Advised urine culture returned negative. Patient is agreeable. Patient states she feels much better since starting the antibiotics. "I do not have the pressure or discomfort any more." Advised patient if symptoms return to give our office a call to schedule appointment with [facility]. Patient is agreeable.  Routing to provider for final review. Patient agreeable to disposition. Will close encounter.

## 2015-03-04 NOTE — Telephone Encounter (Signed)
-----   Message from Megan Salon, MD sent at 03/04/2015  5:49 AM EDT ----- Please call pt.  Urine culture was negative.  How is she feeling?  May need another OV.

## 2015-03-05 ENCOUNTER — Telehealth: Payer: Self-pay | Admitting: Obstetrics & Gynecology

## 2015-03-05 ENCOUNTER — Ambulatory Visit (INDEPENDENT_AMBULATORY_CARE_PROVIDER_SITE_OTHER): Payer: BLUE CROSS/BLUE SHIELD | Admitting: Obstetrics & Gynecology

## 2015-03-05 VITALS — BP 102/62 | HR 56 | Temp 98.2°F | Resp 12 | Wt 137.6 lb

## 2015-03-05 DIAGNOSIS — R1032 Left lower quadrant pain: Secondary | ICD-10-CM | POA: Diagnosis not present

## 2015-03-05 DIAGNOSIS — N9489 Other specified conditions associated with female genital organs and menstrual cycle: Secondary | ICD-10-CM

## 2015-03-05 DIAGNOSIS — R1031 Right lower quadrant pain: Secondary | ICD-10-CM | POA: Diagnosis not present

## 2015-03-05 DIAGNOSIS — R102 Pelvic and perineal pain: Secondary | ICD-10-CM

## 2015-03-05 LAB — CBC WITH DIFFERENTIAL/PLATELET
BASOS ABS: 0 10*3/uL (ref 0.0–0.1)
BASOS PCT: 0 % (ref 0–1)
Eosinophils Absolute: 0.3 10*3/uL (ref 0.0–0.7)
Eosinophils Relative: 2 % (ref 0–5)
HEMATOCRIT: 37.7 % (ref 36.0–46.0)
Hemoglobin: 12.8 g/dL (ref 12.0–15.0)
Lymphocytes Relative: 17 % (ref 12–46)
Lymphs Abs: 2.5 10*3/uL (ref 0.7–4.0)
MCH: 31 pg (ref 26.0–34.0)
MCHC: 34 g/dL (ref 30.0–36.0)
MCV: 91.3 fL (ref 78.0–100.0)
MONO ABS: 1 10*3/uL (ref 0.1–1.0)
MPV: 9.3 fL (ref 8.6–12.4)
Monocytes Relative: 7 % (ref 3–12)
Neutro Abs: 10.7 10*3/uL — ABNORMAL HIGH (ref 1.7–7.7)
Neutrophils Relative %: 74 % (ref 43–77)
Platelets: 340 10*3/uL (ref 150–400)
RBC: 4.13 MIL/uL (ref 3.87–5.11)
RDW: 13.2 % (ref 11.5–15.5)
WBC: 14.5 10*3/uL — AB (ref 4.0–10.5)

## 2015-03-05 NOTE — Progress Notes (Deleted)
Patient ID: Tiffany Nash, female   DOB: 1960-10-31, 54 y.o.   MRN: [id number]

## 2015-03-05 NOTE — Progress Notes (Signed)
Subjective:     Patient ID: Tiffany Nash, female   DOB: [birth year], 54 y.o.   MRN: [id number]  HPI 54 yo G7P3 MWF here for continued complaint of pelvic pressure and now low back pain.  Pt was seen 02/2015 and at that time I thought she might have a UTI.  Urine culture was negative.  She was notified of this yesterday and, although she felt some better yesterday, she reports symptoms are worse today.  I have known this pt for many years and she rarely complains of any body complaints.  Pt reports pain/pressure is low and uncomfortable.  It is not sharp.  Nothing seems to relieve it except to stand upright.  She has not done anything recently that would have caused any musculoskeletal issues.    She has no diarrhea. No constipation.  She does feel like she is having low grade fevers but temp was normal here today and she hasn't actually taken it at home.  Also reports she is having some all over generalized achiness.  Has felt decreased appetite as well.  Reports the vaginal pressure is more like a menstrual cramp to her.  No vaginal bleeding.  No dysuria.  Feels like she is completely emptying her bladder. Finally, having some fatigue and feels tired like she wants to go to bed.    Review of Systems  Constitutional: Positive for appetite change and fatigue. Negative for chills and unexpected weight change.  Respiratory: Negative for shortness of breath.   Cardiovascular: Negative for chest pain.  Gastrointestinal: Negative for nausea, vomiting, abdominal pain, diarrhea, constipation, abdominal distention and rectal pain.  Genitourinary: Positive for pelvic pain. Negative for urgency, vaginal bleeding and vaginal pain.  Musculoskeletal: Positive for back pain and arthralgias. Negative for joint swelling and neck stiffness.  Skin: Negative.   Neurological: Negative.   Psychiatric/Behavioral: Negative.   All other systems reviewed and are negative.      Objective:   Physical Exam   Constitutional: She is oriented to person, place, and time. She appears well-developed and well-nourished.  Cardiovascular: Normal rate.   Abdominal: Soft. Bowel sounds are normal. She exhibits no distension and no mass. There is tenderness (RLQ and LLQ). There is no rebound and no guarding.  Genitourinary: Vagina normal and uterus normal. There is no rash, tenderness, lesion or injury on the right labia. There is no rash, tenderness, lesion or injury on the left labia. Uterus is not deviated, not enlarged, not fixed and not tender. Cervix exhibits no motion tenderness and no discharge. Right adnexum displays no mass, no tenderness and no fullness. Left adnexum displays no mass, no tenderness and no fullness. No bleeding in the vagina. No vaginal discharge found.  Lymphadenopathy:       Right: No inguinal adenopathy present.       Left: No inguinal adenopathy present.  Neurological: She is alert and oriented to person, place, and time.  Skin: Skin is warm and dry.  Psychiatric: She has a normal mood and affect.   Urein micro last week with 0-2 RBCs adn 0-2 WBCs.  Culture negative    Assessment:     Pelvic pressure/pain RLQ and LLQ pain Low back pain     Plan:     With completely normal exam and the history I have with pt, feel I need to proceed with additional testing as this is just a woman who does not complain of body issues/pain.  Plan CT of abdomen/pelvis if can get approved by  insurance company.  CBC and CMP pending.

## 2015-03-05 NOTE — Progress Notes (Signed)
Patient schedule for CT abdomen/ pelvis without contrast at [facility] location on 5/26 at 9:25am. Patient is agreeable to date and time. Order placed for precert.

## 2015-03-06 ENCOUNTER — Telehealth: Payer: Self-pay | Admitting: Emergency Medicine

## 2015-03-06 ENCOUNTER — Encounter: Payer: Self-pay | Admitting: Obstetrics & Gynecology

## 2015-03-06 ENCOUNTER — Telehealth: Payer: Self-pay

## 2015-03-06 DIAGNOSIS — R3129 Other microscopic hematuria: Secondary | ICD-10-CM

## 2015-03-06 DIAGNOSIS — R1030 Lower abdominal pain, unspecified: Secondary | ICD-10-CM

## 2015-03-06 LAB — COMPREHENSIVE METABOLIC PANEL
ALBUMIN: 4.3 g/dL (ref 3.5–5.2)
ALK PHOS: 77 U/L (ref 39–117)
ALT: 27 U/L (ref 0–35)
AST: 25 U/L (ref 0–37)
BUN: 19 mg/dL (ref 6–23)
CHLORIDE: 101 meq/L (ref 96–112)
CO2: 27 mEq/L (ref 19–32)
CREATININE: 0.86 mg/dL (ref 0.50–1.10)
Calcium: 9.8 mg/dL (ref 8.4–10.5)
Glucose, Bld: 112 mg/dL — ABNORMAL HIGH (ref 70–99)
Potassium: 4.6 mEq/L (ref 3.5–5.3)
Sodium: 136 mEq/L (ref 135–145)
TOTAL PROTEIN: 7.1 g/dL (ref 6.0–8.3)
Total Bilirubin: 0.2 mg/dL (ref 0.2–1.2)

## 2015-03-06 NOTE — Telephone Encounter (Signed)
Spoke with Tiffany Nash at [facility]. Order for Ct abdomen and pelvis changed to without contrast. Per discussion with Dr.Miller will also need to rule out kidney stones. Will need kidney stone protocol at appointment. Order has been changed and verified with Tiffany Nash for her appointment tomorrow 5/26 at 9:45am.

## 2015-03-06 NOTE — Telephone Encounter (Signed)
Spoke with patient. Advised patient of message as seen below from [facility]. Patient is agreeable. Appointment for Ct abdomen/pelvis without contrast is scheduled for 5/26 at 9:45am at [facility]. Patient is aware of appointment date and time. CT approved by insurance on 5/24.  Routing to provider for final review. Patient agreeable to disposition. Will close encounter.   Patient aware provider will review message and nurse will return call if any additional advice or change of disposition.

## 2015-03-06 NOTE — Telephone Encounter (Signed)
-----   Message from Megan Salon, MD sent at 03/06/2015  7:42 AM EDT ----- Please inform pt CMP normal except glucose mildly elevated. This is fine as wasn't a fasting test.  WBC ct mildly elevated.  Can you confirm when CT is scheduled?  Thanks.

## 2015-03-06 NOTE — Telephone Encounter (Signed)
Tiffany Nash from [facility] calling. Requests that order be changed from CT abdomen pelvis with and wo contrast to with contrast only. Advised okay to change order as patient requires with contrast only.  Order is changed.  Routing to provider for final review. Patient agreeable to disposition. Will close encounter.

## 2015-03-07 ENCOUNTER — Ambulatory Visit
Admission: RE | Admit: 2015-03-07 | Discharge: 2015-03-07 | Disposition: A | Discharge status: Home / Self Care | Payer: BLUE CROSS/BLUE SHIELD | Source: Ambulatory Visit | Attending: Obstetrics & Gynecology | Admitting: Obstetrics & Gynecology

## 2015-03-07 ENCOUNTER — Telehealth: Payer: Self-pay | Admitting: Obstetrics & Gynecology

## 2015-03-07 DIAGNOSIS — R1030 Lower abdominal pain, unspecified: Secondary | ICD-10-CM

## 2015-03-07 DIAGNOSIS — R3129 Other microscopic hematuria: Secondary | ICD-10-CM

## 2015-03-07 DIAGNOSIS — R1032 Left lower quadrant pain: Secondary | ICD-10-CM

## 2015-03-07 DIAGNOSIS — R1031 Right lower quadrant pain: Secondary | ICD-10-CM

## 2015-03-07 DIAGNOSIS — R102 Pelvic and perineal pain: Secondary | ICD-10-CM

## 2015-03-07 NOTE — Telephone Encounter (Signed)
Routing to Dr. Sabra Heck for results review.

## 2015-03-07 NOTE — Telephone Encounter (Signed)
Patient had a ct scan done this morning and was told Dr.Miller would have the results in two hours. Patient is wondering if Dr.Miller has received the results. Last seen 03/05/15.

## 2015-03-08 MED ORDER — METRONIDAZOLE 500 MG PO TABS: 500.0000 mg | ORAL_TABLET | Freq: Two times a day (BID) | ORAL | Status: DC

## 2015-03-08 MED ORDER — CIPROFLOXACIN HCL 500 MG PO TABS: 500.0000 mg | ORAL_TABLET | Freq: Two times a day (BID) | ORAL | Status: DC

## 2015-03-08 NOTE — Telephone Encounter (Signed)
CT results routed to you.  Ok to close encounter once pt is aware.

## 2015-03-08 NOTE — Telephone Encounter (Signed)
Returned call to patient. Advised of results from Dr. Sabra Heck. We discussed negative CT Scan. Patient states her mother has a history of diverticulosis.   Patient asking if  Diverticulosis could cause elevated white blood cell count. Advised patient that diverticulosis would not cause elevated WBC, as report states no evidence of diverticulitis. Patient with questions about elevated glucose, advised this was not a fasting glucose level and since not fasting, no concerns at this time,   Patient then expresses frustration that she was not contacted with CT results yesterday. Advised patient understands frustration however, she was contacted as soon as possible after Dr. Sabra Heck was able to review results.   Patient states she does not want to go through the weekend with a problem and not know how to treat this issue she is having. She states she still feels "discomfort" with cramping on R side that extends across her lower abdomen. Denies dysuria, fevers or bowel changes, denies vaginal bleeding. Patient feels she may need treatment with antibiotics. Advised patient would send message to Dr. Sabra Heck to obtain further follow up instructions and patient states not to do so, she will contact Dr. [facility] office to discuss and requests that I send her records there. Advised I will send records as requested.   Routing to Dr. Sabra Heck to review.

## 2015-03-08 NOTE — Telephone Encounter (Signed)
Returning a call to Tracy °

## 2015-03-08 NOTE — Telephone Encounter (Signed)
Records faxed to Dr. Cristina Gong at [phone] with fax confirmation received.

## 2015-03-08 NOTE — Telephone Encounter (Signed)
Spoke to pt regarding CT results as she was frustrated she wasn't called yesterday.  Reviewed CT scan with pt.  She called [facility].  Has see Dr. Cristina Gong.  They offered her appt today.  She can't go.  Pt was advised this might be sub-acute diverticulosis so she has appt next week.  Anxious about what will happen if she gets worse over the weekend.  Pt and I discussed diverticulosis treatment--flagyl and cipro.  Will call into to her pharmacy and if she worsens over the weekend, she will call and go and get it.  I gave her my direct number for the weekend so she can communicate with me if she ends up starting the medication or feels worse.  Today she feels pretty good.   Pt also aware I want her WBC ct repeated.  Asked her to have records sent from appt next week and if CBC not repeated there, will have it done here.  Pt voiced clear understanding and thanked me for calling.

## 2015-03-08 NOTE — Telephone Encounter (Signed)
-----   Message from Megan Salon, MD sent at 03/08/2015  6:50 AM EDT ----- Please inform CT was negative for abnormal finding.  Diverticulosis was noted but no evidence of diverticulitis.  No stones noted.  Spleen, liver, adrenal glands, pancreas all normal.  No gall bladder noted.

## 2015-03-08 NOTE — Telephone Encounter (Signed)
Message left to return call to Yukie Bergeron at [phone].    

## 2015-05-10 ENCOUNTER — Other Ambulatory Visit: Payer: Self-pay | Admitting: Obstetrics & Gynecology

## 2015-05-10 MED ORDER — FAMOTIDINE 20 MG PO TABS: 20.0000 mg | ORAL_TABLET | ORAL | Status: DC | PRN

## 2015-05-10 NOTE — Telephone Encounter (Signed)
Patient notified that both rx's have been sent to her pharmacy.

## 2015-05-10 NOTE — Addendum Note (Signed)
Addended by: Alfonzo Feller on: 05/10/2015 10:14 AM   Modules accepted: Orders

## 2015-05-10 NOTE — Telephone Encounter (Signed)
Medication refill request: Imitrex 100 mg  Last AEX:  01/11/2015 with SM Next AEX: 03/24/16 with SM  Last MMG (if hormonal medication request): n/a Refill authorized: #9/3 rfs ?

## 2015-05-10 NOTE — Telephone Encounter (Signed)
Patient is requesting refills for her migraine medication and for famotidine. She is completely out and going out of town this weekend. She is using [facility] [phone]

## 2015-05-10 NOTE — Telephone Encounter (Signed)
Dr. Sabra Heck patient is also requesting refills of Pepcid 20 mg to my knowledge I don't see where we've ever sent anything for the patient, please advise.

## 2015-07-13 DIAGNOSIS — B029 Zoster without complications: Secondary | ICD-10-CM

## 2015-07-13 HISTORY — DX: Zoster without complications: B02.9

## 2016-03-12 DIAGNOSIS — R7309 Other abnormal glucose: Secondary | ICD-10-CM

## 2016-03-12 HISTORY — DX: Other abnormal glucose: R73.09

## 2016-03-24 ENCOUNTER — Ambulatory Visit (INDEPENDENT_AMBULATORY_CARE_PROVIDER_SITE_OTHER): Payer: BLUE CROSS/BLUE SHIELD | Admitting: Obstetrics & Gynecology

## 2016-03-24 ENCOUNTER — Encounter: Payer: Self-pay | Admitting: Obstetrics & Gynecology

## 2016-03-24 ENCOUNTER — Other Ambulatory Visit: Payer: Self-pay | Admitting: Obstetrics & Gynecology

## 2016-03-24 VITALS — BP 88/60 | HR 66 | Resp 14 | Ht 65.75 in | Wt 140.8 lb

## 2016-03-24 DIAGNOSIS — Z0000 Encounter for general adult medical examination without abnormal findings: Secondary | ICD-10-CM | POA: Diagnosis not present

## 2016-03-24 DIAGNOSIS — N63 Unspecified lump in breast: Secondary | ICD-10-CM

## 2016-03-24 DIAGNOSIS — Z01419 Encounter for gynecological examination (general) (routine) without abnormal findings: Secondary | ICD-10-CM

## 2016-03-24 DIAGNOSIS — Z8619 Personal history of other infectious and parasitic diseases: Secondary | ICD-10-CM | POA: Diagnosis not present

## 2016-03-24 DIAGNOSIS — G43009 Migraine without aura, not intractable, without status migrainosus: Secondary | ICD-10-CM

## 2016-03-24 DIAGNOSIS — N6310 Unspecified lump in the right breast, unspecified quadrant: Secondary | ICD-10-CM

## 2016-03-24 LAB — COMPREHENSIVE METABOLIC PANEL
ALT: 21 U/L (ref 6–29)
AST: 26 U/L (ref 10–35)
Albumin: 4.4 g/dL (ref 3.6–5.1)
Alkaline Phosphatase: 60 U/L (ref 33–130)
BUN: 15 mg/dL (ref 7–25)
CALCIUM: 9.6 mg/dL (ref 8.6–10.4)
CHLORIDE: 104 mmol/L (ref 98–110)
CO2: 27 mmol/L (ref 20–31)
Creat: 0.8 mg/dL (ref 0.50–1.05)
Glucose, Bld: 103 mg/dL — ABNORMAL HIGH (ref 65–99)
Potassium: 4.2 mmol/L (ref 3.5–5.3)
SODIUM: 140 mmol/L (ref 135–146)
Total Bilirubin: 0.5 mg/dL (ref 0.2–1.2)
Total Protein: 6.7 g/dL (ref 6.1–8.1)

## 2016-03-24 LAB — CBC
HEMATOCRIT: 35.5 % (ref 35.0–45.0)
Hemoglobin: 11.9 g/dL (ref 11.7–15.5)
MCH: 31.5 pg (ref 27.0–33.0)
MCHC: 33.5 g/dL (ref 32.0–36.0)
MCV: 93.9 fL (ref 80.0–100.0)
MPV: 9.2 fL (ref 7.5–12.5)
Platelets: 315 10*3/uL (ref 140–400)
RBC: 3.78 MIL/uL — AB (ref 3.80–5.10)
RDW: 13.1 % (ref 11.0–15.0)
WBC: 7.1 10*3/uL (ref 3.8–10.8)

## 2016-03-24 LAB — LIPID PANEL
CHOL/HDL RATIO: 2.2 ratio (ref <=5.0)
Cholesterol: 192 mg/dL (ref 125–200)
HDL: 89 mg/dL (ref >=46)
LDL Cholesterol: 87 mg/dL (ref <130)
TRIGLYCERIDES: 81 mg/dL (ref <150)
VLDL: 16 mg/dL (ref <30)

## 2016-03-24 LAB — POCT URINALYSIS DIPSTICK
Bilirubin, UA: NEGATIVE
Blood, UA: NEGATIVE
Glucose, UA: NEGATIVE
Ketones, UA: NEGATIVE
Leukocytes, UA: NEGATIVE
Nitrite, UA: NEGATIVE
Protein, UA: NEGATIVE
Urobilinogen, UA: NEGATIVE
pH, UA: 6

## 2016-03-24 LAB — TSH: TSH: 1.22 mIU/L

## 2016-03-24 LAB — HEPATITIS C ANTIBODY: HCV AB: NEGATIVE

## 2016-03-24 NOTE — Progress Notes (Signed)
55 y.o. [place].Clement MarriedCaucasianF here for annual exam.  Doing well.  Has not had any abdominal issues since last May.  Had CT at that time and also saw Dr. Cristina Gong (who thought this was due to an atypical UTI but her urine culture at that time was negative.)  Did have shingles last October.  Case was "not bad".  Treated with anti-viral therapy.  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  Has a granddaughter.  July 6th she will be a year.  Lives in [place].  Denies vaginal bleeding.    Patient's last menstrual period was 05/12/2012.          Sexually active: Yes.    The current method of family planning is post menopausal status.    Exercising: Yes.    cardio and weights Smoker:  no  Health Maintenance: Pap:  01/11/2015 negative, HR HPV negative  History of abnormal Pap:  yes MMG:  12/16/15 BIRADS 2 benign  Colonoscopy: 2011 BMD:   Pt states >10 years TDaP:  Up to date per pt. Pneumonia vaccine(s):  never Zostavax:   never Hep C testing: drawn today  Screening Labs: drawn today, Hb today: 11.7, Urine today: normal    reports that she has never smoked. She has never used smokeless tobacco. She reports that she drinks about 1.0 oz of alcohol per week. She reports that she does not use illicit drugs.  Past Medical History  Diagnosis Date  . Migraine   . TMJ (temporomandibular joint syndrome)   . Abnormal Pap smear   . Schatzki's ring     and hiatal hernia on EGD  . Vertigo     Past Surgical History  Procedure Laterality Date  . Dilation and curettage of uterus  1991  . Laparoscopic cholecystectomy  1992  . Gynecologic cryosurgery  1980s  . Breast biopsy  1/12    fibrocystic changes   . Esophageal dilation      Family History  Problem Relation Age of Onset  . Diabetes Father   . Goiter Mother   . Heart disease      ROS:  Pertinent items are noted in HPI.  Otherwise, a comprehensive ROS was negative.  Exam:   Filed Vitals:   03/24/16 1346  BP: 88/60  Pulse: 66  Resp: 14   Height: 5' 5.75" (1.67 m)  Weight: 140 lb 12.8 oz (63.866 kg)    General appearance: alert, cooperative and appears stated age Head: Normocephalic, without obvious abnormality, atraumatic Neck: no adenopathy, supple, symmetrical, trachea midline and thyroid normal to inspection and palpation Lungs: clear to auscultation bilaterally Breasts: normal appearance, no masses or tenderness except for 1cm lesion at 2 o'clock on the left breast, 7cm from nipple.  Probable lymph node. Heart: regular rate and rhythm Abdomen: soft, non-tender; bowel sounds normal; no masses,  no organomegaly Extremities: extremities normal, atraumatic, no cyanosis or edema Skin: Skin color, texture, turgor normal. No rashes or lesions Lymph nodes: Cervical, supraclavicular, and axillary nodes normal. No abnormal inguinal nodes palpated Neurologic: Grossly normal   Pelvic: External genitalia:  no lesions              Urethra:  normal appearing urethra with no masses, tenderness or lesions              Bartholins and Skenes: normal                 Vagina: normal appearing vagina with normal color and discharge, no lesions  Cervix: no lesions              Pap taken: No. Bimanual Exam:  Uterus:  normal size, contour, position, consistency, mobility, non-tender              Adnexa: normal adnexa and no mass, fullness, tenderness               Rectovaginal: Confirms               Anus:  normal sphincter tone, no lesions  Chaperone was present for exam.  A:  Well Woman with normal exam PMP, no HRT  H/O migraines, much improved with menopause Spouse with HPV related throat cancer Left breast nodule, feels like a lymph node but this is new  P: Mammogram yearly.  This is up to date.  Will proceed with diagnostic imaging of left breast due to finding today. Pap and HR HPV today 2016.  No pap today. CBC, CMP, Lipids, TSH, Vit D Hep C antibody obtained today. return annually or  prn

## 2016-03-24 NOTE — Progress Notes (Signed)
Scheduled patient while in office for left breast diagnostic mammogram with left breast ultrasound at [facility] on 04/02/2016 at 8:15 am. Patient is agreeable to date and time. Placed in mammogram hold.

## 2016-03-25 DIAGNOSIS — Z8619 Personal history of other infectious and parasitic diseases: Secondary | ICD-10-CM | POA: Insufficient documentation

## 2016-03-25 LAB — VITAMIN D 25 HYDROXY (VIT D DEFICIENCY, FRACTURES): Vit D, 25-Hydroxy: 42 ng/mL (ref 30–100)

## 2016-03-26 LAB — HEMOGLOBIN A1C
HEMOGLOBIN A1C: 5.9 % — AB (ref <5.7)
Mean Plasma Glucose: 123 mg/dL

## 2016-03-27 ENCOUNTER — Encounter: Payer: Self-pay | Admitting: Obstetrics and Gynecology

## 2016-04-08 ENCOUNTER — Other Ambulatory Visit: Payer: Self-pay | Admitting: Radiology

## 2016-04-10 ENCOUNTER — Telehealth: Payer: Self-pay | Admitting: *Deleted

## 2016-04-10 DIAGNOSIS — C50412 Malignant neoplasm of upper-outer quadrant of left female breast: Secondary | ICD-10-CM | POA: Insufficient documentation

## 2016-04-10 NOTE — Telephone Encounter (Signed)
Confirmed [facility] for 04/15/16 at 815am .  Instructions and contact information given.

## 2016-04-15 ENCOUNTER — Ambulatory Visit: Payer: BLUE CROSS/BLUE SHIELD | Attending: General Surgery | Admitting: Physical Therapy

## 2016-04-15 ENCOUNTER — Ambulatory Visit
Admission: RE | Admit: 2016-04-15 | Discharge: 2016-04-15 | Disposition: A | Discharge status: Home / Self Care | Payer: BLUE CROSS/BLUE SHIELD | Source: Ambulatory Visit | Attending: Radiation Oncology | Admitting: Radiation Oncology

## 2016-04-15 ENCOUNTER — Encounter: Payer: Self-pay | Admitting: Hematology

## 2016-04-15 ENCOUNTER — Ambulatory Visit (HOSPITAL_BASED_OUTPATIENT_CLINIC_OR_DEPARTMENT_OTHER): Payer: BLUE CROSS/BLUE SHIELD | Admitting: Hematology

## 2016-04-15 ENCOUNTER — Encounter: Payer: Self-pay | Admitting: Skilled Nursing Facility1

## 2016-04-15 ENCOUNTER — Other Ambulatory Visit: Payer: Self-pay | Admitting: General Surgery

## 2016-04-15 ENCOUNTER — Encounter: Payer: Self-pay | Admitting: *Deleted

## 2016-04-15 ENCOUNTER — Other Ambulatory Visit (HOSPITAL_BASED_OUTPATIENT_CLINIC_OR_DEPARTMENT_OTHER): Payer: BLUE CROSS/BLUE SHIELD

## 2016-04-15 VITALS — BP 117/66 | HR 76 | Temp 98.1°F | Resp 18 | Ht 65.75 in | Wt 139.9 lb

## 2016-04-15 DIAGNOSIS — R293 Abnormal posture: Secondary | ICD-10-CM | POA: Diagnosis present

## 2016-04-15 DIAGNOSIS — C50412 Malignant neoplasm of upper-outer quadrant of left female breast: Secondary | ICD-10-CM

## 2016-04-15 DIAGNOSIS — C50912 Malignant neoplasm of unspecified site of left female breast: Secondary | ICD-10-CM

## 2016-04-15 DIAGNOSIS — Z170 Estrogen receptor positive status [ER+]: Secondary | ICD-10-CM

## 2016-04-15 LAB — CBC WITH DIFFERENTIAL/PLATELET
BASO%: 0.7 % (ref 0.0–2.0)
BASOS ABS: 0 10*3/uL (ref 0.0–0.1)
EOS ABS: 0.3 10*3/uL (ref 0.0–0.5)
EOS%: 5.7 % (ref 0.0–7.0)
HCT: 39.9 % (ref 34.8–46.6)
HGB: 13.1 g/dL (ref 11.6–15.9)
LYMPH#: 1.7 10*3/uL (ref 0.9–3.3)
LYMPH%: 29.1 % (ref 14.0–49.7)
MCH: 31.4 pg (ref 25.1–34.0)
MCHC: 32.8 g/dL (ref 31.5–36.0)
MCV: 96 fL (ref 79.5–101.0)
MONO#: 0.5 10*3/uL (ref 0.1–0.9)
MONO%: 7.7 % (ref 0.0–14.0)
NEUT#: 3.4 10*3/uL (ref 1.5–6.5)
NEUT%: 56.8 % (ref 38.4–76.8)
PLATELETS: 298 10*3/uL (ref 145–400)
RBC: 4.16 10*6/uL (ref 3.70–5.45)
RDW: 12.8 % (ref 11.2–14.5)
WBC: 5.9 10*3/uL (ref 3.9–10.3)

## 2016-04-15 LAB — COMPREHENSIVE METABOLIC PANEL
ALT: 16 U/L (ref 0–55)
ANION GAP: 9 meq/L (ref 3–11)
AST: 16 U/L (ref 5–34)
Albumin: 4 g/dL (ref 3.5–5.0)
Alkaline Phosphatase: 68 U/L (ref 40–150)
BILIRUBIN TOTAL: 0.43 mg/dL (ref 0.20–1.20)
BUN: 13.4 mg/dL (ref 7.0–26.0)
CHLORIDE: 105 meq/L (ref 98–109)
CO2: 27 meq/L (ref 22–29)
Calcium: 9.8 mg/dL (ref 8.4–10.4)
Creatinine: 0.8 mg/dL (ref 0.6–1.1)
EGFR: 82 mL/min/{1.73_m2} — AB (ref >90)
Glucose: 81 mg/dl (ref 70–140)
Potassium: 4.2 mEq/L (ref 3.5–5.1)
Sodium: 141 mEq/L (ref 136–145)
Total Protein: 7.4 g/dL (ref 6.4–8.3)

## 2016-04-15 NOTE — Progress Notes (Signed)
Subjective:     Patient ID: Tiffany Nash, female   DOB: 01/22/1961, 55 y.o.   MRN: [id number]  HPI   Review of Systems     Objective:   Physical Exam For the patient to understand and be given the tools to implement a healthy plant based diet during their cancer diagnosis.     Assessment:     Patient was seen today and found to be in good spirits and accompanied by her husband. Pts ht 65 in, 139 pounds, BMI 22.8. Pt states her husband has worked with Ernestene Kiel CSO,RD,LDN when her husband was diagnosed with cnacer. Pt states they would love to pop in and see her. Pt states she is doing okay and will speak with Pamala Hurry if she has any questions.     Plan:     Dietitian educated the patient on implementing a plant based diet by incorporating more plant proteins, fruits, and vegetables. As a part of a healthy routine physical activity was discussed. A folder of evidence based information with a focus on a plant based diet and general nutrition during cancer was given to the patient.  As a part of the continuum of care the cancer dietitian's contact information was given to the patient in the event they would like to have a follow up appointment. The importance of legitimate, evidence based information was discussed and examples were given.

## 2016-04-15 NOTE — Therapy (Signed)
[place], [place], [address] Phone: [phone]   Fax:  [phone]  Physical Therapy Evaluation  Patient Details  Name: Tiffany Nash MRN: [id number] Date of Birth: [birth year] Referring Provider: Dr. Excell Seltzer  Encounter Date: 04/15/2016      PT End of Session - 04/15/16 1114    Visit Number 1   Number of Visits 1   PT Start Time 1030   PT Stop Time 1100   PT Time Calculation (min) 30 min   Activity Tolerance Patient tolerated treatment well   Behavior During Therapy [facility] for tasks assessed/performed      Past Medical History  Diagnosis Date  . Migraine   . TMJ (temporomandibular joint syndrome)   . Abnormal Pap smear   . Schatzki's ring     and hiatal hernia on EGD  . Vertigo   . Shingles rash 07/2015     shingles on right rib cage   . Elevated hemoglobin A1c June 2017    5.9  . Breast cancer [facility])     Past Surgical History  Procedure Laterality Date  . Dilation and curettage of uterus  1991  . Laparoscopic cholecystectomy  1992  . Gynecologic cryosurgery  1980s  . Breast biopsy  1/12    fibrocystic changes   . Esophageal dilation      There were no vitals filed for this visit.       Subjective Assessment - 04/15/16 1114    Subjective Patient reports she is here today to be seen by her medical team for her newly diagnosed left breast cancer.   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 It is located in the upper outer quadrant and measures 1 cm. It is ER/PR positive and HER2 negative with a Ki67 of 15%.    Patient Stated Goals Reduce lymphedema risk and learn post op shoulder ROM HEP            [facility] PT Assessment - 04/15/16 0001    Assessment   Medical Diagnosis Left breast cancer   Referring Provider Dr. Excell Seltzer   Onset Date/Surgical Date  04/09/16   Hand Dominance Right   Prior Therapy none   Precautions   Precautions Other (comment)   Precaution Comments Active cancer   Restrictions   Weight Bearing Restrictions No   Balance Screen   Has the patient fallen in the past 6 months No   Has the patient had a decrease in activity level because of a fear of falling?  No   Is the patient reluctant to leave their home because of a fear of falling?  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   Leisure She exercises doing interval training 3-4 times per week doing walking, rowing, weights and pushups at [employer]   Overall Cognitive Status Within Functional Limits for tasks assessed   Posture/Postural Control   Posture/Postural Control Postural limitations   Postural Limitations Rounded Shoulders   ROM / Strength   AROM / PROM / Strength AROM;Strength   AROM   AROM Assessment Site Shoulder  Right/Left Shoulder Right;Left   Right Shoulder Extension 57 Degrees   Right Shoulder Flexion 150 Degrees   Right Shoulder ABduction 171 Degrees   Right Shoulder Internal Rotation 73 Degrees   Right Shoulder External Rotation 80 Degrees   Left Shoulder Extension 60 Degrees   Left Shoulder Flexion 149 Degrees   Left Shoulder ABduction 176 Degrees   Left Shoulder Internal Rotation 88 Degrees   Left Shoulder External Rotation 75 Degrees   Strength   Overall Strength Within functional limits for tasks performed           LYMPHEDEMA/ONCOLOGY QUESTIONNAIRE - 04/15/16 1113    Type   Cancer Type Left breast cancer   Lymphedema Assessments   Lymphedema Assessments Upper extremities   Right Upper Extremity Lymphedema   10 cm Proximal to  Olecranon Process 26.7 cm   Olecranon Process 23.7 cm   10 cm Proximal to Ulnar Styloid Process 20 cm   Just Proximal to Ulnar Styloid Process 13.9 cm   Across Hand at [employer] 16.7 cm   At [place] of 2nd Digit 6 cm   Left Upper Extremity Lymphedema   10 cm Proximal to Olecranon Process 25.5 cm   Olecranon Process 22.7 cm   10 cm Proximal to Ulnar Styloid Process 19.8 cm   Just Proximal to Ulnar Styloid Process 13.8 cm   Across Hand at [employer] 16.4 cm   At [place] of 2nd Digit 5.8 cm      Patient was instructed today in a home exercise program today for post op shoulder range of motion. These included active assist shoulder flexion in sitting, scapular retraction, wall walking with shoulder abduction, and hands behind head external rotation.  She was encouraged to do these twice a day, holding 3 seconds and repeating 5 times when permitted by her physician.         PT Education - 04/15/16 1114    Education provided Yes   Education Details Lymphedema risk reduction and post op shoulder ROM HEP   Person(s) Educated Patient;Spouse   Methods Explanation;Demonstration;Handout   Comprehension Returned demonstration;Verbalized understanding              Breast Clinic Goals - 04/15/16 1118    Patient will be able to verbalize understanding of pertinent lymphedema risk reduction practices relevant to her diagnosis specifically related to skin care.   Time 1   Period Days   Status Achieved   Patient will be able to return demonstrate and/or verbalize understanding of the post-op home exercise program related to regaining shoulder range of motion.   Time 1   Period Days   Status Achieved   Patient will be able to verbalize understanding of the importance of attending the postoperative After Breast Cancer Class for further lymphedema risk reduction education and therapeutic exercise.   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 It is located in the upper outer quadrant and measures 1 cm. It is ER/PR positive and HER2 negative with a Ki67 of 15%.   Her multidisciplinary medical team met prior to her assessments to determine a recommended treatment plan.  She is planning to have a left lumpectomy and sentinel node biopsy followed by Oncotype testing, radiation and anti-estrogen therapy.  She may benefit  from post op PT to regain shoulder ROM and reduce lymphedema risk.  Due to her lack of comorbidities, her eval is of low complexity.   Rehab Potential Excellent   Clinical Impairments Affecting Rehab Potential none   PT Frequency One time visit   PT Treatment/Interventions Therapeutic exercise;Patient/family education   PT Next Visit Plan Will f/u after surgery to determine needs   PT Home Exercise Plan Post op shoulder ROM HEP   Consulted and Agree with Plan of Care Patient;Family member/caregiver   Family Member Consulted Husband      Patient will benefit from skilled therapeutic intervention in order to improve the following deficits and impairments:  Decreased knowledge of precautions, Impaired UE functional use, Decreased range of motion  Visit Diagnosis: Carcinoma of upper-outer quadrant of left female breast ([place]) - Plan: PT plan of care cert/re-cert  Abnormal posture - Plan: PT plan of care cert/re-cert   Patient will follow up at outpatient cancer rehab if needed following surgery.  If the patient requires physical therapy at that time, a specific plan will be dictated and sent to the referring physician for approval. The patient was educated today on appropriate basic range of motion exercises to begin post operatively and the importance of attending the After Breast Cancer class following surgery.  Patient was educated today on lymphedema risk reduction practices as it pertains to  recommendations that will benefit the patient immediately following surgery.  She verbalized good understanding.  No additional physical therapy is indicated at this time.      Problem List Patient Active Problem List   Diagnosis Date Noted  . Breast cancer of upper-outer quadrant of left female breast ([place]) 04/10/2016  . History of shingles 03/25/2016  . Rapid heart rate 08/16/2013  . Family history of early CAD 08/16/2013  . Migraine   . TMJ (temporomandibular joint syndrome)   . Abnormal Pap smear   . Schatzki's ring   . Vertigo     Annia Friendly, [place] 04/15/2016 11:23 AM  [place], [place], [address] Phone: [phone]   Fax:  [phone]  Name: Tiffany Nash MRN: [id number] Date of Birth: September 16, 1961

## 2016-04-15 NOTE — Patient Instructions (Signed)

## 2016-04-15 NOTE — Progress Notes (Signed)
Radiation Oncology         [phone] ________________________________  Name: Tiffany Nash MRN: [id number]  Date: 04/15/2016  DOB: Feb 28, 1961  UX:NATFTDD,UKGUR, MD  Excell Seltzer, MD     REFERRING PHYSICIAN: Excell Seltzer, MD   DIAGNOSIS: The encounter diagnosis was Breast cancer of upper-outer quadrant of left female breast ([place]).   HISTORY OF PRESENT ILLNESS: Tiffany Nash is a 55 y.o. female seen at the request of Dr. Sabra Heck. The patient had a screening mammogram at [facility] on 12/16/15 with Breast Composition Category C: Heterogeneously Dense and BI-RADS Category 2: Benign. The patient had her annual physical exam per Dr. Sabra Heck on 03/24/16 who palpated a 1 cm lesion at the 2 o'clock position of the left breast, 7 cm from the nipple. Dr. Sabra Heck ordered diagnostic imaging of the left breast due to that finding.  The patient had a diagnostic mammogram and ultrasound on 04/02/16. This found a 1 cm mass at the 1 o'clock position of the left breast with no left axillary lymphadenopathy. The patient had a biopsy of the left breast on 04/08/16. 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  The patient presents today in multidisciplinary breast clinic to discuss the role of radiation in the management of her disease.  PREVIOUS RADIATION THERAPY: No  PAST MEDICAL HISTORY:  Past Medical History  Diagnosis Date  . Migraine   . TMJ (temporomandibular joint syndrome)   . Abnormal Pap smear   . Schatzki's ring     and hiatal hernia on EGD  . Vertigo   . Shingles rash 07/2015     shingles on right rib cage   . Elevated hemoglobin A1c June 2017    5.9  . Breast cancer ([place])        PAST SURGICAL HISTORY: Past Surgical History  Procedure Laterality Date  . Dilation and curettage of uterus  1991  . Laparoscopic cholecystectomy  1992  . Gynecologic cryosurgery  1980s  . Breast biopsy  1/12      fibrocystic changes   . Esophageal dilation       FAMILY HISTORY:  Family History  Problem Relation Age of Onset  . Diabetes Father   . Goiter Mother   . Heart disease       SOCIAL HISTORY:  reports that she has never smoked. She has never used smokeless tobacco. She reports that she drinks about 1.0 oz of alcohol per week. She reports that she does not use illicit drugs. The patient is married and accompanied by her husband. She lives in [place].    ALLERGIES: Penicillins and Tylox   MEDICATIONS:  Current Outpatient Prescriptions  Medication Sig Dispense Refill  . Acetaminophen (TYLENOL PO) Take 1 tablet by mouth as needed. Reported on 04/15/2016    . famotidine (PEPCID) 20 MG tablet Take 1 tablet (20 mg total) by mouth as needed. 30 tablet 11  . ibuprofen (ADVIL,MOTRIN) 800 MG tablet Take 400 mg by mouth as needed for pain.     . NON FORMULARY at bedtime as needed. Herb Complex.    . Nutritional Supplements (JUICE PLUS FIBRE PO) Take by mouth at bedtime as needed.     . SUMAtriptan (IMITREX) 100 MG tablet take 1 tablet by mouth if needed for migraines MAY REPEAT IN 2 HOURS IF HEADACHE PERSISTS 9 tablet 6   No current facility-administered medications for this encounter.     REVIEW OF  SYSTEMS: On review of systems, the patient reports that she is doing well overall. She denies any chest pain, shortness of breath, cough, fevers, chills, night sweats, unintended weight changes. She denies any bowel or bladder disturbances, and denies abdominal pain, nausea or vomiting. She denies any new musculoskeletal or joint aches or pains. A complete review of systems is obtained and is otherwise negative.     PHYSICAL EXAM:   Pain scale 0/10 In general this is a well appearing Caucasian female in no acute distress. She is alert and oriented x4 and appropriate throughout the examination. HEENT reveals that the patient is normocephalic, atraumatic. EOMs are intact. PERRLA. Skin is  intact without any evidence of gross lesions. Cardiovascular exam reveals a regular rate and rhythm, no clicks rubs or murmurs are auscultated. Chest is clear to auscultation bilaterally.  Bilateral breast exam is performed. The right breast is intact without palpable abnormality. The left is intact with a palpable mass along the left upper outer quadrant of the breast. Minimal ecchymoses No nipple bleeding or discharge is noted of either breast. Lymphatic assessment is performed and does not reveal any adenopathy in the cervical, supraclavicular, axillary, or inguinal chains. Abdomen has active bowel sounds in all quadrants and is intact. The abdomen is soft, non tender, non distended. Lower extremities are negative for pretibial pitting edema, deep calf tenderness, cyanosis or clubbing.   ECOG = 0  0 - Asymptomatic (Fully active, able to carry on all predisease activities without restriction)  1 - Symptomatic but completely ambulatory (Restricted in physically strenuous activity but ambulatory and able to carry out work of a light or sedentary nature. For example, light housework, office work)  2 - Symptomatic, <50% in bed during the day (Ambulatory and capable of all self care but unable to carry out any work activities. Up and about more than 50% of waking hours)  3 - Symptomatic, >50% in bed, but not bedbound (Capable of only limited self-care, confined to bed or chair 50% or more of waking hours)  4 - Bedbound (Completely disabled. Cannot carry on any self-care. Totally confined to bed or chair)  5 - Death   Eustace Pen MM, Creech RH, Tormey DC, et al. [phone]). "Toxicity and response criteria of the [facility]". [place] Oncol. 5 (6): 649-55    LABORATORY DATA:  Lab Results  Component Value Date   WBC 5.9 04/15/2016   HGB 13.1 04/15/2016   HCT 39.9 04/15/2016   MCV 96.0 04/15/2016   PLT 298 04/15/2016   Lab Results  Component Value Date   NA 141 04/15/2016    K 4.2 04/15/2016   CL 104 03/24/2016   CO2 27 04/15/2016   Lab Results  Component Value Date   ALT 16 04/15/2016   AST 16 04/15/2016   ALKPHOS 68 04/15/2016   BILITOT 0.43 04/15/2016      RADIOGRAPHY: No results found.     IMPRESSION: Clinical Stage IA, T1b, Nx, Mx, ER/PR positive, Her-2 negative invasive ductal carcinoma of the left breast.   PLAN: Dr. Lisbeth Renshaw discusses the pathology findings and reviews the nature of in invasive breast disease. The consensus from the breast conference include breast conservation with lumpectomy with a sentinel lymph node mapping, followed by oncotype testing, followed by external radiotherapy if chemotherapy is not necessary. Dr. Lisbeth Renshaw discusses the rationale for 33 fractions over 6 1/2 weeks of treatment. Following radiotherapy to the breast, the patient will meet back with Dr. Burr Medico to discuss  the role of antiestrogen therapy.   We discussed the details of radiotherapy including logistics and delivery, the risks, benefits, short, and long term effects of therapy. She is interested in moving forward with radiation treatment when clinically appropriate.    The above documentation reflects my direct findings during this shared patient visit. Please see the separate note by Dr. Lisbeth Renshaw on this date for the remainder of the patient's plan of care.    Carola Rhine, PAC  This document serves as a record of services personally performed by Shona Simpson, PA-C and Kyung Rudd, MD. It was created on their behalf by Darcus Austin, a trained medical scribe. The creation of this record is based on the scribe's personal observations and the providers' statements to them. This document has been checked and approved by the attending provider.

## 2016-04-15 NOTE — Progress Notes (Signed)
[facility] Psychosocial Distress Screening Clinical Social Work  Clinical Social Work met with pt and her husband at [facility] to introduce self, explain role of CSW/Pt and Family Support Team and to review distress screening protocol.  The patient scored a 4 on the Psychosocial Distress Thermometer which indicates mild distress. Clinical Social Worker discussed distress screen with pt to assess for distress and other psychosocial needs. Pt feels her distress is now a 2 after meeting with the medical team and becoming aware of her treatment plan. Pt denies current concerns/needs and appears to have a good understanding of her treatment plan. Pt and her husband are very aware of resources at [facility], as husband was a pt here several years ago. They report they did not utilize many resources here other than dietician, but may explore these resources for pt. CSW educated pt on all resources available. They agree to reach out to [place] team as needed.   ONCBCN DISTRESS SCREENING 04/15/2016  Screening Type Initial Screening  Distress experienced in past week (1-10) 4  Information Concerns Type Lack of info about diagnosis;Lack of info about treatment  Physical Problem type Sleep/insomnia    Clinical Social Worker follow up needed: No.  If yes, follow up plan:  Loren Racer, [place]  [facility] Phone: [phone] Fax: [phone]

## 2016-04-15 NOTE — Progress Notes (Signed)
[name]  [phone] [phone]  Clinic New Consult Note   Patient Care Team: Dibas Koirala, MD as PCP - General (Family Medicine) Excell Seltzer, MD as Consulting Physician (General Surgery) Truitt Merle, MD as Consulting Physician (Hematology) Kyung Rudd, MD as Consulting Physician (Radiation Oncology) 04/15/2016  CHIEF COMPLAINTS/PURPOSE OF CONSULTATION:  Newly diagnosed left breast cancer   Oncology History   Breast cancer of upper-outer quadrant of left female breast [facility])   Staging form: Breast, AJCC 7th Edition     Clinical stage from 04/15/2016: Stage IA (T1b, N0, M0) - Unsigned       Staging comments: Staged at breast conference on 7.5.17        Breast cancer of upper-outer quadrant of left female breast ([place])   04/02/2016 Mammogram 1 cm oval massin the upper outer quadrant of left breast, suspicious for malignancy.    04/08/2016 Initial Diagnosis Breast cancer of upper-outer quadrant of left female breast ([place])   04/08/2016 Initial Biopsy Left breast core needle biopsy showed invasive ductal carcinoma and DCIS, grade 1-2.   04/08/2016 Receptors her2 Your 100% positive, strong staining, PR 70% positive, strong staining, HER-2 negative, Ki-67 15%    HISTORY OF PRESENTING ILLNESS:  Tiffany Nash 55 y.o. female is here because of Her newly diagnosed left breast cancer. She is accompanied by her husband to our multidisciplinary breast clinic today.  She had routine screening mammogram in March and 17 which was negative. On her routine follow-up with her primary care physician a few weeks ago, breast examination reviewed a left breast mass. No skin change or nipple discharge. She feels well, no pain or other complains. She underwent diagnostic mammogram and ultrasound on 04/02/2016, which showed a 1 cm oval mass in the upper outer quadrant of left breast, core needle biopsy showed invasive ductal carcinoma and DCIS, ER/PR positive, HER-2  negative.   She is married, lives with her husband, who had head and neck cancer and was treated in our cancer center 5-6 years ago. She has no family history of breast cancer or other malignancy. 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   GYN HISTORY  Menarchal: 13 LMP: 2014 Contraceptive: 6 years  HRT: no G7P4: she did breast feeding for 6 weeks for each children, ago of 28-19, 2 daughters   MEDICAL HISTORY:  Past Medical History  Diagnosis Date  . Migraine   . TMJ (temporomandibular joint syndrome)   . Abnormal Pap smear   . Schatzki's ring     and hiatal hernia on EGD  . Vertigo   . Shingles rash 07/2015     shingles on right rib cage   . Elevated hemoglobin A1c June 2017    5.9  . Breast cancer [facility])     SURGICAL HISTORY: Past Surgical History  Procedure Laterality Date  . Dilation and curettage of uterus  1991  . Laparoscopic cholecystectomy  1992  . Gynecologic cryosurgery  1980s  . Breast biopsy  1/12    fibrocystic changes   . Esophageal dilation      SOCIAL HISTORY: Social History   Social History  . Marital Status: Married    Spouse Name: N/A  . Number of Children: N/A  . Years of Education: N/A   Occupational History  . Not on file.   Social History Main Topics  . Smoking status: Never Smoker   . Smokeless tobacco: Never Used  . Alcohol Use: 2.4 - 3.0 oz/week  2 Standard drinks or equivalent, 2-3 Glasses of wine per week     Comment: weekends only   . Drug Use: No  . Sexual Activity:    Partners: Male   Other Topics Concern  . Not on file   Social History Narrative   She is self employed, has a Air cabin crew for wedding etc   FAMILY HISTORY: Family History  Problem Relation Age of Onset  . Diabetes Father   . Goiter Mother   . Heart disease      ALLERGIES:  is allergic to penicillins and tylox.  MEDICATIONS:  Current Outpatient Prescriptions  Medication Sig Dispense Refill  . famotidine (PEPCID) 20 MG tablet Take 1  tablet (20 mg total) by mouth as needed. 30 tablet 11  . ibuprofen (ADVIL,MOTRIN) 800 MG tablet Take 400 mg by mouth as needed for pain.     . NON FORMULARY at bedtime as needed. Herb Complex.    . Nutritional Supplements (JUICE PLUS FIBRE PO) Take by mouth at bedtime as needed.     . SUMAtriptan (IMITREX) 100 MG tablet take 1 tablet by mouth if needed for migraines MAY REPEAT IN 2 HOURS IF HEADACHE PERSISTS 9 tablet 6  . Acetaminophen (TYLENOL PO) Take 1 tablet by mouth as needed. Reported on 04/15/2016     No current facility-administered medications for this visit.    REVIEW OF SYSTEMS:   Constitutional: Denies fevers, chills or abnormal night sweats Eyes: Denies blurriness of vision, double vision or watery eyes Ears, nose, mouth, throat, and face: Denies mucositis or sore throat Respiratory: Denies cough, dyspnea or wheezes Cardiovascular: Denies palpitation, chest discomfort or lower extremity swelling Gastrointestinal:  Denies nausea, heartburn or change in bowel habits Skin: Denies abnormal skin rashes Lymphatics: Denies new lymphadenopathy or easy bruising Neurological:Denies numbness, tingling or new weaknesses Behavioral/Psych: Mood is stable, no new changes  All other systems were reviewed with the patient and are negative.  PHYSICAL EXAMINATION: ECOG PERFORMANCE STATUS: 0 - Asymptomatic  Filed Vitals:   04/15/16 0858  BP: 117/66  Pulse: 76  Temp: 98.1 F (36.7 C)  Resp: 18   Filed Weights   04/15/16 0858  Weight: 139 lb 14.4 oz (63.458 kg)    GENERAL:alert, no distress and comfortable SKIN: skin color, texture, turgor are normal, no rashes or significant lesions EYES: normal, conjunctiva are pink and non-injected, sclera clear OROPHARYNX:no exudate, no erythema and lips, buccal mucosa, and tongue normal  NECK: supple, thyroid normal size, non-tender, without nodularity LYMPH:  no palpable lymphadenopathy in the cervical, axillary or inguinal LUNGS: clear to  auscultation and percussion with normal breathing effort HEART: regular rate & rhythm and no murmurs and no lower extremity edema ABDOMEN:abdomen soft, non-tender and normal bowel sounds Musculoskeletal:no cyanosis of digits and no clubbing    PSYCH: alert & oriented x 3 with fluent speech NEURO: no focal motor/sensory deficits Breasts: Breast inspection showed them to be symmetrical with no nipple discharge. (+) Ecchymosis at the biopsy site of her left breast. Palpation of the left breast revealed a 1 cm mass in the upper outer quadrant of left breast, palpitation of the right breast and both axillas revealed no obvious mass that I could appreciate.   LABORATORY DATA:  I have reviewed the data as listed CBC Latest Ref Rng 04/15/2016 03/24/2016 03/05/2015  WBC 3.9 - 10.3 10e3/uL 5.9 7.1 14.5(H)  Hemoglobin 11.6 - 15.9 g/dL 13.1 11.9 12.8  Hematocrit 34.8 - 46.6 % 39.9 35.5 37.7  Platelets 145 -  400 10e3/uL 298 315 340   CMP Latest Ref Rng 04/15/2016 03/24/2016 03/05/2015  Glucose 70 - 140 mg/dl 81 103(H) 112(H)  BUN 7.0 - 26.0 mg/dL 13.4 15 19   Creatinine 0.6 - 1.1 mg/dL 0.8 0.80 0.86  Sodium 136 - 145 mEq/L 141 140 136  Potassium 3.5 - 5.1 mEq/L 4.2 4.2 4.6  Chloride 98 - 110 mmol/L - 104 101  CO2 22 - 29 mEq/L 27 27 27   Calcium 8.4 - 10.4 mg/dL 9.8 9.6 9.8  Total Protein 6.4 - 8.3 g/dL 7.4 6.7 7.1  Total Bilirubin 0.20 - 1.20 mg/dL 0.43 0.5 0.2  Alkaline Phos 40 - 150 U/L 68 60 77  AST 5 - 34 U/L 16 26 25   ALT 0 - 55 U/L 16 21 27    PATHOLOGY REPORT Diagnosis 04/08/2016 Breast, left, needle core biopsy - INVASIVE DUCTAL CARCINOMA. - DUCTAL CARCINOMA IN SITU. - SEE COMMENT. Microscopic Comment Although definitive grading of breast carcinoma is best done on excision, the features of the invasive tumor from the left breast needle core biopsy are compatible with a grade 1-2 breast carcinoma. Breast prognostic markers will be performed and reported in an addendum. Findings are called to  [facility] on 04/09/2016. Dr. Vicente Males has seen this case in consultation with agreement. (RH:ecj 04/09/2016)   Results: IMMUNOHISTOCHEMICAL AND MORPHOMETRIC ANALYSIS PERFORMED MANUALLY Estrogen Receptor: 100%, POSITIVE, STRONG STAINING INTENSITY Progesterone Receptor: 70%, POSITIVE, STRONG STAINING INTENSITY Proliferation Marker Ki67: 15%  Results: HER2 - NEGATIVE RATIO OF HER2/CEP17 SIGNALS 1.06 AVERAGE HER2 COPY NUMBER PER CELL 1.85  RADIOGRAPHIC STUDIES: I have personally reviewed the radiological images as listed and agreed with the findings in the report.  Diagnostic mammogram and ultrasound of the left breast 04/02/2016 Impression: Suspicious of malignancy The 1 cm oval mass in the left breast is suspicious of malignancy, in the left upper outer quadrant posterior depth. Breast composition category C  ASSESSMENT & PLAN:  56 year old Caucasian female, with palpable left breast mass, which was not detected by screening mammogram 3 months ago.  1. Breast cancer of lower-outer quadrant of left female breast, invasive and in situ ductal carcinoma, grade 1-2, cT1bN0M0, stage IA, ER 100% positive, PR 70% positive, HER-2 negative -I reviewed her mammogram, ultrasound, and biopsy results with patient and her husband in details. -She has early-stage breast cancer, is a candidate for lumpectomy and sentinel lymph node biopsy. She was seen by Dr. Excell Seltzer today, plan to have surgery soon. -We discussed the risk of cancer recurrence after surgery. I recommend Oncotype DX test to further evaluate the risk of cancer recurrence and the benefit of adjuvant therapy, if node negative. Patient agrees to proceed. I gave her written material about the test. -If her sentinel lymph nodes were positive for malignant cells, I would recommend mammaprint genomic test to evaluate his risk of cancer recurrence and the benefit of adjuvant  -Given the strong ER  and PR positivity, I recommend adjuvant endocrine therapy. She is post menopause, I would recommend aromatase inhibitor. We briefly discussed the benefit and potential side effects of AI. She is interested. -She would benefit from adjuvant radiation after lumpectomy. She will be seen by Dr. Lisbeth Renshaw today. -We discussed cancer surveillance after surgery. She will continue  diagnostic mammogram annually. I encouraged her to do self breast exam in the future, and see [place] regularly for follow-up.  Plan -She will have left breast lumpectomy and sentinel lymph node biopsy soon -Oncotype DX if sentinel  lymph node was negative. -If her Oncotype DX returns as low risk, we will give her her a call about the result,  and she will proceed with radiation. Otherwise I'll see her 3 weeks after her surgery.  All questions were answered. The patient knows to call the clinic with any problems, questions or concerns. I spent 55 minutes counseling the patient face to face. The total time spent in the appointment was 60 minutes and more than 50% was on counseling.     Truitt Merle, MD 04/15/2016 11:38 AM

## 2016-04-17 ENCOUNTER — Encounter (HOSPITAL_BASED_OUTPATIENT_CLINIC_OR_DEPARTMENT_OTHER): Payer: Self-pay | Admitting: *Deleted

## 2016-04-22 ENCOUNTER — Ambulatory Visit (HOSPITAL_BASED_OUTPATIENT_CLINIC_OR_DEPARTMENT_OTHER)
Admission: RE | Admit: 2016-04-22 | Discharge: 2016-04-22 | Disposition: A | Discharge status: Home / Self Care | Payer: BLUE CROSS/BLUE SHIELD | Source: Ambulatory Visit | Attending: General Surgery | Admitting: General Surgery

## 2016-04-22 ENCOUNTER — Ambulatory Visit (HOSPITAL_BASED_OUTPATIENT_CLINIC_OR_DEPARTMENT_OTHER): Payer: BLUE CROSS/BLUE SHIELD | Admitting: Anesthesiology

## 2016-04-22 ENCOUNTER — Ambulatory Visit (HOSPITAL_COMMUNITY)
Admission: RE | Admit: 2016-04-22 | Discharge: 2016-04-22 | Disposition: A | Discharge status: Home / Self Care | Payer: BLUE CROSS/BLUE SHIELD | Source: Ambulatory Visit | Attending: General Surgery | Admitting: General Surgery

## 2016-04-22 ENCOUNTER — Encounter (HOSPITAL_BASED_OUTPATIENT_CLINIC_OR_DEPARTMENT_OTHER)
Admission: RE | Disposition: A | Discharge status: Home / Self Care | Payer: Self-pay | Source: Ambulatory Visit | Attending: General Surgery

## 2016-04-22 ENCOUNTER — Encounter (HOSPITAL_BASED_OUTPATIENT_CLINIC_OR_DEPARTMENT_OTHER): Payer: Self-pay

## 2016-04-22 DIAGNOSIS — D0512 Intraductal carcinoma in situ of left breast: Secondary | ICD-10-CM | POA: Insufficient documentation

## 2016-04-22 DIAGNOSIS — C50912 Malignant neoplasm of unspecified site of left female breast: Secondary | ICD-10-CM | POA: Diagnosis present

## 2016-04-22 DIAGNOSIS — K219 Gastro-esophageal reflux disease without esophagitis: Secondary | ICD-10-CM | POA: Diagnosis not present

## 2016-04-22 DIAGNOSIS — G43909 Migraine, unspecified, not intractable, without status migrainosus: Secondary | ICD-10-CM | POA: Insufficient documentation

## 2016-04-22 DIAGNOSIS — C50412 Malignant neoplasm of upper-outer quadrant of left female breast: Secondary | ICD-10-CM

## 2016-04-22 DIAGNOSIS — Z170 Estrogen receptor positive status [ER+]: Secondary | ICD-10-CM | POA: Insufficient documentation

## 2016-04-22 HISTORY — PX: BREAST LUMPECTOMY WITH RADIOACTIVE SEED AND SENTINEL LYMPH NODE BIOPSY: SHX6550

## 2016-04-22 HISTORY — DX: Gastro-esophageal reflux disease without esophagitis: K21.9

## 2016-04-22 SURGERY — BREAST LUMPECTOMY WITH RADIOACTIVE SEED AND SENTINEL LYMPH NODE BIOPSY
Anesthesia: Regional | Site: Breast | Laterality: Left

## 2016-04-22 MED ORDER — METHYLENE BLUE 0.5 % INJ SOLN
INTRAVENOUS | Status: AC
  Filled 2016-04-22: qty 10

## 2016-04-22 MED ORDER — HYDROCODONE-ACETAMINOPHEN 7.5-325 MG PO TABS: 1.0000 | ORAL_TABLET | Freq: Once | ORAL | Status: DC | PRN

## 2016-04-22 MED ORDER — FENTANYL CITRATE (PF) 100 MCG/2ML IJ SOLN
INTRAMUSCULAR | Status: AC
  Filled 2016-04-22: qty 2

## 2016-04-22 MED ORDER — SCOPOLAMINE 1 MG/3DAYS TD PT72: 1.0000 | MEDICATED_PATCH | Freq: Once | TRANSDERMAL | Status: DC | PRN

## 2016-04-22 MED ORDER — GABAPENTIN 300 MG PO CAPS
300.0000 mg | ORAL_CAPSULE | ORAL | Status: AC
  Administered 2016-04-22: 300 mg via ORAL

## 2016-04-22 MED ORDER — GABAPENTIN 300 MG PO CAPS
ORAL_CAPSULE | ORAL | Status: AC
  Filled 2016-04-22: qty 1

## 2016-04-22 MED ORDER — PROPOFOL 10 MG/ML IV BOLUS
INTRAVENOUS | Status: DC | PRN
  Administered 2016-04-22: 150 mg via INTRAVENOUS

## 2016-04-22 MED ORDER — ACETAMINOPHEN 500 MG PO TABS
1000.0000 mg | ORAL_TABLET | ORAL | Status: AC
  Administered 2016-04-22: 1000 mg via ORAL

## 2016-04-22 MED ORDER — ONDANSETRON HCL 4 MG/2ML IJ SOLN
INTRAMUSCULAR | Status: AC
  Filled 2016-04-22: qty 2

## 2016-04-22 MED ORDER — HYDROMORPHONE HCL 1 MG/ML IJ SOLN
INTRAMUSCULAR | Status: AC
  Filled 2016-04-22: qty 1

## 2016-04-22 MED ORDER — CELECOXIB 200 MG PO CAPS
ORAL_CAPSULE | ORAL | Status: AC
  Filled 2016-04-22: qty 2

## 2016-04-22 MED ORDER — PHENYLEPHRINE 40 MCG/ML (10ML) SYRINGE FOR IV PUSH (FOR BLOOD PRESSURE SUPPORT)
PREFILLED_SYRINGE | INTRAVENOUS | Status: DC | PRN
  Administered 2016-04-22 (×2): 80 ug via INTRAVENOUS

## 2016-04-22 MED ORDER — CELECOXIB 400 MG PO CAPS
400.0000 mg | ORAL_CAPSULE | ORAL | Status: AC
  Administered 2016-04-22: 400 mg via ORAL

## 2016-04-22 MED ORDER — MIDAZOLAM HCL 2 MG/2ML IJ SOLN
INTRAMUSCULAR | Status: AC
  Filled 2016-04-22: qty 2

## 2016-04-22 MED ORDER — ACETAMINOPHEN 500 MG PO TABS
ORAL_TABLET | ORAL | Status: AC
  Filled 2016-04-22: qty 2

## 2016-04-22 MED ORDER — KETOROLAC TROMETHAMINE 30 MG/ML IJ SOLN: 30.0000 mg | Freq: Once | INTRAMUSCULAR | Status: DC | PRN

## 2016-04-22 MED ORDER — TECHNETIUM TC 99M SULFUR COLLOID FILTERED
1.0000 | Freq: Once | INTRAVENOUS | Status: AC | PRN
  Administered 2016-04-22: 1 via INTRADERMAL

## 2016-04-22 MED ORDER — DEXAMETHASONE SODIUM PHOSPHATE 4 MG/ML IJ SOLN
INTRAMUSCULAR | Status: DC | PRN
  Administered 2016-04-22: 10 mg via INTRAVENOUS

## 2016-04-22 MED ORDER — BUPIVACAINE-EPINEPHRINE (PF) 0.5% -1:200000 IJ SOLN
INTRAMUSCULAR | Status: DC | PRN
  Administered 2016-04-22: 10 mL via PERINEURAL

## 2016-04-22 MED ORDER — PROPOFOL 10 MG/ML IV BOLUS
INTRAVENOUS | Status: AC
  Filled 2016-04-22: qty 20

## 2016-04-22 MED ORDER — CIPROFLOXACIN IN D5W 400 MG/200ML IV SOLN
INTRAVENOUS | Status: AC
  Filled 2016-04-22: qty 200

## 2016-04-22 MED ORDER — CHLORHEXIDINE GLUCONATE CLOTH 2 % EX PADS: 6.0000 | MEDICATED_PAD | Freq: Once | CUTANEOUS | Status: DC

## 2016-04-22 MED ORDER — FENTANYL CITRATE (PF) 100 MCG/2ML IJ SOLN
50.0000 ug | INTRAMUSCULAR | Status: DC | PRN
  Administered 2016-04-22 (×2): 50 ug via INTRAVENOUS

## 2016-04-22 MED ORDER — HYDROCODONE-ACETAMINOPHEN 5-325 MG PO TABS: 1.0000 | ORAL_TABLET | ORAL | Status: DC | PRN

## 2016-04-22 MED ORDER — MIDAZOLAM HCL 2 MG/2ML IJ SOLN
1.0000 mg | INTRAMUSCULAR | Status: DC | PRN
  Administered 2016-04-22 (×2): 1 mg via INTRAVENOUS

## 2016-04-22 MED ORDER — BUPIVACAINE-EPINEPHRINE (PF) 0.5% -1:200000 IJ SOLN
INTRAMUSCULAR | Status: AC
  Filled 2016-04-22: qty 30

## 2016-04-22 MED ORDER — LIDOCAINE 2% (20 MG/ML) 5 ML SYRINGE
INTRAMUSCULAR | Status: AC
  Filled 2016-04-22: qty 5

## 2016-04-22 MED ORDER — LIDOCAINE 2% (20 MG/ML) 5 ML SYRINGE
INTRAMUSCULAR | Status: DC | PRN
  Administered 2016-04-22: 25 mg via INTRAVENOUS

## 2016-04-22 MED ORDER — PROMETHAZINE HCL 25 MG/ML IJ SOLN: 6.2500 mg | INTRAMUSCULAR | Status: DC | PRN

## 2016-04-22 MED ORDER — LACTATED RINGERS IV SOLN
INTRAVENOUS | Status: DC
  Administered 2016-04-22 (×2): via INTRAVENOUS

## 2016-04-22 MED ORDER — ONDANSETRON HCL 4 MG/2ML IJ SOLN
INTRAMUSCULAR | Status: DC | PRN
  Administered 2016-04-22: 4 mg via INTRAVENOUS

## 2016-04-22 MED ORDER — GLYCOPYRROLATE 0.2 MG/ML IJ SOLN: 0.2000 mg | Freq: Once | INTRAMUSCULAR | Status: DC | PRN

## 2016-04-22 MED ORDER — CIPROFLOXACIN IN D5W 400 MG/200ML IV SOLN
400.0000 mg | INTRAVENOUS | Status: AC
  Administered 2016-04-22: 400 mg via INTRAVENOUS

## 2016-04-22 MED ORDER — SODIUM CHLORIDE 0.9 % IJ SOLN
INTRAMUSCULAR | Status: AC
  Filled 2016-04-22: qty 10

## 2016-04-22 MED ORDER — DEXAMETHASONE SODIUM PHOSPHATE 10 MG/ML IJ SOLN
INTRAMUSCULAR | Status: AC
  Filled 2016-04-22: qty 1

## 2016-04-22 MED ORDER — HYDROMORPHONE HCL 1 MG/ML IJ SOLN
0.2500 mg | INTRAMUSCULAR | Status: DC | PRN
  Administered 2016-04-22: 0.5 mg via INTRAVENOUS

## 2016-04-22 MED ORDER — BUPIVACAINE-EPINEPHRINE (PF) 0.5% -1:200000 IJ SOLN
INTRAMUSCULAR | Status: DC | PRN
  Administered 2016-04-22: 25 mL

## 2016-04-22 SURGICAL SUPPLY — 50 items
APPLIER CLIP 9.375 MED OPEN (MISCELLANEOUS) ×3
BINDER BREAST LRG (GAUZE/BANDAGES/DRESSINGS) IMPLANT
BINDER BREAST MEDIUM (GAUZE/BANDAGES/DRESSINGS) ×3 IMPLANT
BINDER BREAST XLRG (GAUZE/BANDAGES/DRESSINGS) IMPLANT
BINDER BREAST XXLRG (GAUZE/BANDAGES/DRESSINGS) IMPLANT
BLADE SURG 15 STRL LF DISP TIS (BLADE) ×1 IMPLANT
BLADE SURG 15 STRL SS (BLADE) ×2
CANISTER SUC SOCK COL 7IN (MISCELLANEOUS) IMPLANT
CANISTER SUCT 1200ML W/VALVE (MISCELLANEOUS) ×3 IMPLANT
CHLORAPREP W/TINT 26ML (MISCELLANEOUS) ×3 IMPLANT
CLIP APPLIE 9.375 MED OPEN (MISCELLANEOUS) ×1 IMPLANT
COVER BACK TABLE 60X90IN (DRAPES) ×3 IMPLANT
COVER MAYO STAND STRL (DRAPES) ×3 IMPLANT
COVER PROBE W GEL 5X96 (DRAPES) ×3 IMPLANT
DECANTER SPIKE VIAL GLASS SM (MISCELLANEOUS) IMPLANT
DEVICE DUBIN W/COMP PLATE 8390 (MISCELLANEOUS) ×3 IMPLANT
DRAPE LAPAROSCOPIC ABDOMINAL (DRAPES) ×3 IMPLANT
DRAPE UTILITY XL STRL (DRAPES) ×3 IMPLANT
ELECT COATED BLADE 2.86 ST (ELECTRODE) ×3 IMPLANT
ELECT REM PT RETURN 9FT ADLT (ELECTROSURGICAL) ×3
ELECTRODE REM PT RTRN 9FT ADLT (ELECTROSURGICAL) ×1 IMPLANT
GLOVE BIO SURGEON STRL SZ 6.5 (GLOVE) ×2 IMPLANT
GLOVE BIO SURGEONS STRL SZ 6.5 (GLOVE) ×1
GLOVE BIOGEL PI IND STRL 8 (GLOVE) ×1 IMPLANT
GLOVE BIOGEL PI INDICATOR 8 (GLOVE) ×2
GLOVE ECLIPSE 7.5 STRL STRAW (GLOVE) ×3 IMPLANT
GLOVE SURG SS PI 7.0 STRL IVOR (GLOVE) ×3 IMPLANT
GOWN STRL REUS W/ TWL LRG LVL3 (GOWN DISPOSABLE) ×1 IMPLANT
GOWN STRL REUS W/ TWL XL LVL3 (GOWN DISPOSABLE) ×1 IMPLANT
GOWN STRL REUS W/TWL LRG LVL3 (GOWN DISPOSABLE) ×2
GOWN STRL REUS W/TWL XL LVL3 (GOWN DISPOSABLE) ×2
ILLUMINATOR WAVEGUIDE N/F (MISCELLANEOUS) ×3 IMPLANT
KIT MARKER MARGIN INK (KITS) ×3 IMPLANT
LIQUID BAND (GAUZE/BANDAGES/DRESSINGS) ×3 IMPLANT
NDL SAFETY ECLIPSE 18X1.5 (NEEDLE) ×1 IMPLANT
NEEDLE HYPO 18GX1.5 SHARP (NEEDLE) ×2
NEEDLE HYPO 25X1 1.5 SAFETY (NEEDLE) ×6 IMPLANT
NS IRRIG 1000ML POUR BTL (IV SOLUTION) IMPLANT
PACK BASIN DAY SURGERY FS (CUSTOM PROCEDURE TRAY) ×3 IMPLANT
PENCIL BUTTON HOLSTER BLD 10FT (ELECTRODE) ×3 IMPLANT
SLEEVE SCD COMPRESS KNEE MED (MISCELLANEOUS) ×3 IMPLANT
SPONGE LAP 4X18 X RAY DECT (DISPOSABLE) ×3 IMPLANT
SUT MON AB 5-0 PS2 18 (SUTURE) ×3 IMPLANT
SUT VICRYL 3-0 CR8 SH (SUTURE) ×3 IMPLANT
SYR CONTROL 10ML LL (SYRINGE) ×6 IMPLANT
TOWEL OR 17X24 6PK STRL BLUE (TOWEL DISPOSABLE) ×3 IMPLANT
TOWEL OR NON WOVEN STRL DISP B (DISPOSABLE) ×3 IMPLANT
TUBE CONNECTING 20'X1/4 (TUBING)
TUBE CONNECTING 20X1/4 (TUBING) IMPLANT
YANKAUER SUCT BULB TIP NO VENT (SUCTIONS) ×3 IMPLANT

## 2016-04-22 NOTE — Interval H&P Note (Signed)
History and Physical Interval Note:  04/22/2016 8:26 AM  Tiffany Nash  has presented today for surgery, with the diagnosis of LEFT BREAST CANCER  The various methods of treatment have been discussed with the patient and family. After consideration of risks, benefits and other options for treatment, the patient has consented to  Procedure(s): BREAST LUMPECTOMY WITH RADIOACTIVE SEED AND SENTINEL LYMPH NODE BIOPSY (Left) as a surgical intervention .  The patient's history has been reviewed, patient examined, no change in status, stable for surgery.  I have reviewed the patient's chart and labs.  Questions were answered to the patient's satisfaction.     [name]

## 2016-04-22 NOTE — Progress Notes (Signed)
Assisted Dr. Rob Fitzgerald with left, ultrasound guided, pectoralis block. Side rails up, monitors on throughout procedure. See vital signs in flow sheet. Tolerated Procedure well. 

## 2016-04-22 NOTE — Op Note (Signed)
Preoperative Diagnosis: LEFT BREAST CANCER  Postoprative Diagnosis: LEFT BREAST CANCER  Procedure: Procedure(s): LEFT BREAST LUMPECTOMY WITH RADIOACTIVE SEED AND SENTINEL LYMPH NODE BIOPSY   Surgeon: Excell Seltzer T   Assistants: none  Anesthesia:  General LMA anesthesia  Indications: patient is a 55 year old female with a recent diagnosis of stage I invasive ductal carcinoma of the upper outer left breast with a 1 cm tumor in the tail of [place] area. After preoperative workup and discussion detailed extensively elsewhere we have elected to proceed with radioactive seed localized left breast lumpectomy and left axillary sentinel lymph node biopsy as initial surgical treatment.  Procedure Detail: Patient was examined in the holding area and the seed placement confirmed with the neoprobe.  She underwent a pectoral block by anesthesia and underwent injection of 1 mCi of technetium sulfur colloid intradermally around theright nipple preoperatively. She was taken to the operating room, placed in the supine position on the operating table, and laryngeal mask general anesthesia induced. She was carefully positioned with her left arm extended. She received preoperative IV antibiotics. PAS rate and place. I confirmed an area of high counts in the left axilla prior to prepping. The entire left chest and breast, axilla and upper arm were widely sterilely prepped and draped. Patient timeout was performed and correct procedure verified. Based on the location of the tumor  I elected to use a single axillary incision.  An incision in a skin crease in the anterior axilla was used and dissection carried down through the subcutaneous tissue. A skin's subcutaneous flap was then raised medially over the area of high counts localized with the neoprobe and there was a small mass palpable in this area. The mass seemed superficial and directly over it I took a very thin skin flap. The flap was continued short distance  medially and then the area of the tumor easily exposed. An approximately  3 cm lumpectomy was then performed with cautery.. The breast tissue was relatively thin and the  Anterior margin was skin and the posterior margin was on the pectoralis muscle.The specimen was removed and inked for margins. Specimen x-ray showed the clip and seed located centrally within the specimen.  Attention was turned to the sentinel lymph node biopsy. A very short  Skin and subcutaneous flap was raised superior laterally over the axilla and an area of markedly elevated counts identified with the neoprobe. Blunt dissection was carried down onto a small lymph node with markedly elevated counts. This was excised with cautery and ex vivo had counts of 1200. I could not feel any palpable nodes in the axilla. Using the neoprobe I identified a second node with slightly elevated counts which was completely excised. Background in the axilla was very low and there was no palpable abnormality. The lumpectomy cavity was marked with clips. Complete hemostasis was assured. I mobilized breast tissue medially off the chest wall and laterally somewhat off the latissimus  Which was closed into the lumpectomy cavity with interrupted 3-0 Vicryl. Subcutaneous cutaneous tissue was closed with interrupted 3-0 Vicryl and the skin with running subcuticular 5-0 Monocryl and Liquiban. Sponge needle and instrument counts were correct.    Findings: As above  Estimated Blood Loss:  Minimal         Drains: nnone  Blood Given: none          Specimens: #1 left breast lumpectomy     #2 left axillary sentinel lymph node     #3  Left axillary sentinel lymph node  Complications:  * No complications entered in OR log *         Disposition: PACU - hemodynamically stable.         Condition: stable

## 2016-04-22 NOTE — Anesthesia Postprocedure Evaluation (Signed)
Anesthesia Post Note  Patient: Blanka E Denunzio  Procedure(s) Performed: Procedure(s) (LRB): BREAST LUMPECTOMY WITH RADIOACTIVE SEED AND SENTINEL LYMPH NODE BIOPSY (Left)  Patient location during evaluation: PACU Anesthesia Type: General Level of consciousness: awake and alert Pain management: pain level controlled Vital Signs Assessment: post-procedure vital signs reviewed and stable Respiratory status: spontaneous breathing, nonlabored ventilation, respiratory function stable and patient connected to nasal cannula oxygen Cardiovascular status: blood pressure returned to baseline and stable Postop Assessment: no signs of nausea or vomiting Anesthetic complications: no    Last Vitals:  Filed Vitals:   04/22/16 1315 04/22/16 1330  BP: 112/70 107/71  Pulse: 64 66  Temp:    Resp: 14 13    Last Pain:  Filed Vitals:   04/22/16 1341  PainSc: 3                  Tiajuana Amass

## 2016-04-22 NOTE — Interval H&P Note (Signed)
History and Physical Interval Note:  04/22/2016 11:35 AM  Tiffany Nash  has presented today for surgery, with the diagnosis of LEFT BREAST CANCER  The various methods of treatment have been discussed with the patient and family. After consideration of risks, benefits and other options for treatment, the patient has consented to  Procedure(s): BREAST LUMPECTOMY WITH RADIOACTIVE SEED AND SENTINEL LYMPH NODE BIOPSY (Left) as a surgical intervention .  The patient's history has been reviewed, patient examined, no change in status, stable for surgery.  I have reviewed the patient's chart and labs.  Questions were answered to the patient's satisfaction.     Saralyn Willison T

## 2016-04-22 NOTE — Discharge Instructions (Signed)
°Post Anesthesia Home Care Instructions ° °Activity: °Get plenty of rest for the remainder of the day. A responsible adult should stay with you for 24 hours following the procedure.  °For the next 24 hours, DO NOT: °-Drive a car °-Operate machinery °-Drink alcoholic beverages °-Take any medication unless instructed by your physician °-Make any legal decisions or sign important papers. ° °Meals: °Start with liquid foods such as gelatin or soup. Progress to regular foods as tolerated. Avoid greasy, spicy, heavy foods. If nausea and/or vomiting occur, drink only clear liquids until the nausea and/or vomiting subsides. Call your physician if vomiting continues. ° °Special Instructions/Symptoms: °Your throat may feel dry or sore from the anesthesia or the breathing tube placed in your throat during surgery. If this causes discomfort, gargle with warm salt water. The discomfort should disappear within 24 hours. ° °If you had a scopolamine patch placed behind your ear for the management of post- operative nausea and/or vomiting: ° °1. The medication in the patch is effective for 72 hours, after which it should be removed.  Wrap patch in a tissue and discard in the trash. Wash hands thoroughly with soap and water. °2. You may remove the patch earlier than 72 hours if you experience unpleasant side effects which may include dry mouth, dizziness or visual disturbances. °3. Avoid touching the patch. Wash your hands with soap and water after contact with the patch. °  ° ° ° ° ° ° ° ° °[facility] °Office Phone Number [phone] ° °BREAST BIOPSY/ PARTIAL MASTECTOMY: POST OP INSTRUCTIONS ° °Always review your discharge instruction sheet given to you by the facility where your surgery was performed. ° °IF YOU HAVE DISABILITY OR FAMILY LEAVE FORMS, YOU MUST BRING THEM TO THE OFFICE FOR PROCESSING.  DO NOT GIVE THEM TO YOUR DOCTOR. ° °1. A prescription for pain medication may be given to you upon discharge.  Take  your pain medication as prescribed, if needed.  If narcotic pain medicine is not needed, then you may take acetaminophen (Tylenol) or ibuprofen (Advil) as needed. °2. Take your usually prescribed medications unless otherwise directed °3. If you need a refill on your pain medication, please contact your pharmacy.  They will contact our office to request authorization.  Prescriptions will not be filled after 5pm or on week-ends. °4. You should eat very light the first 24 hours after surgery, such as soup, crackers, pudding, etc.  Resume your normal diet the day after surgery. °5. Most patients will experience some swelling and bruising in the breast.  Ice packs and a good support bra will help.  Swelling and bruising can take several days to resolve.  °6. It is common to experience some constipation if taking pain medication after surgery.  Increasing fluid intake and taking a stool softener will usually help or prevent this problem from occurring.  A mild laxative (Milk of Magnesia or Miralax) should be taken according to package directions if there are no bowel movements after 48 hours. °7. Unless discharge instructions indicate otherwise, you may remove your bandages 24-48 hours after surgery, and you may shower at that time.  You may have steri-strips (small skin tapes) in place directly over the incision.  These strips should be left on the skin for 7-10 days.  If your surgeon used skin glue on the incision, you may shower in 24 hours.  The glue will flake off over the next 2-3 weeks.  Any sutures or staples will be removed at the office   during your follow-up visit. °8. ACTIVITIES:  You may resume regular daily activities (gradually increasing) beginning the next day.  Wearing a good support bra or sports bra minimizes pain and swelling.  You may have sexual intercourse when it is comfortable. °a. You may drive when you no longer are taking prescription pain medication, you can comfortably wear a seatbelt, and  you can safely maneuver your car and apply brakes. °b. RETURN TO WORK:  ______________________________________________________________________________________ °9. You should see your doctor in the office for a follow-up appointment approximately two weeks after your surgery.  Your doctor’s nurse will typically make your follow-up appointment when she calls you with your pathology report.  Expect your pathology report 2-3 business days after your surgery.  You may call to check if you do not hear from us after three days. °10. OTHER INSTRUCTIONS: _______________________________________________________________________________________________ _____________________________________________________________________________________________________________________________________ °_____________________________________________________________________________________________________________________________________ °_____________________________________________________________________________________________________________________________________ ° °WHEN TO CALL YOUR DOCTOR: °1. Fever over 101.0 °2. Nausea and/or vomiting. °3. Extreme swelling or bruising. °4. Continued bleeding from incision. °5. Increased pain, redness, or drainage from the incision. ° °The clinic staff is available to answer your questions during regular business hours.  Please don’t hesitate to call and ask to speak to one of the nurses for clinical concerns.  If you have a medical emergency, go to the nearest emergency room or call 911.  A surgeon from [facility] is always on call at the hospital. ° °For further questions, please visit [email or web address]  ° ° ° °Post Anesthesia Home Care Instructions ° °Activity: °Get plenty of rest for the remainder of the day. A responsible adult should stay with you for 24 hours following the procedure.  °For the next 24 hours, DO NOT: °-Drive a car °-Operate machinery °-Drink alcoholic  beverages °-Take any medication unless instructed by your physician °-Make any legal decisions or sign important papers. ° °Meals: °Start with liquid foods such as gelatin or soup. Progress to regular foods as tolerated. Avoid greasy, spicy, heavy foods. If nausea and/or vomiting occur, drink only clear liquids until the nausea and/or vomiting subsides. Call your physician if vomiting continues. ° °Special Instructions/Symptoms: °Your throat may feel dry or sore from the anesthesia or the breathing tube placed in your throat during surgery. If this causes discomfort, gargle with warm salt water. The discomfort should disappear within 24 hours. ° °If you had a scopolamine patch placed behind your ear for the management of post- operative nausea and/or vomiting: ° °1. The medication in the patch is effective for 72 hours, after which it should be removed.  Wrap patch in a tissue and discard in the trash. Wash hands thoroughly with soap and water. °2. You may remove the patch earlier than 72 hours if you experience unpleasant side effects which may include dry mouth, dizziness or visual disturbances. °3. Avoid touching the patch. Wash your hands with soap and water after contact with the patch. °  ° °

## 2016-04-22 NOTE — Anesthesia Procedure Notes (Addendum)
Anesthesia Regional Block:  Pectoralis block  Pre-Anesthetic Checklist: ,, timeout performed, Correct Patient, Correct Site, Correct Laterality, Correct Procedure, Correct Position, site marked, Risks and benefits discussed,  Surgical consent,  Pre-op evaluation,  At surgeon's request and post-op pain management  Laterality: Left  Prep: chloraprep       Needles:   Needle Type: Echogenic Needle     Needle Length: 9cm 9 cm Needle Gauge: 21 and 21 G    Additional Needles:  Procedures: ultrasound guided (picture in chart) Pectoralis block Narrative:  Start time: 04/22/2016 10:25 AM End time: 04/22/2016 10:33 AM Injection made incrementally with aspirations every 5 mL.  Performed by: Personally  Anesthesiologist: Suzette Battiest   Procedure Name: LMA Insertion Date/Time: 04/22/2016 11:45 AM Performed by: Lyndee Leo Pre-anesthesia Checklist: Patient identified, Emergency Drugs available, Suction available and Patient being monitored Patient Re-evaluated:Patient Re-evaluated prior to inductionOxygen Delivery Method: Circle system utilized Preoxygenation: Pre-oxygenation with 100% oxygen Intubation Type: IV induction Ventilation: Mask ventilation without difficulty LMA: LMA inserted LMA Size: 4.0 Number of attempts: 1 Airway Equipment and Method: Bite block Placement Confirmation: positive ETCO2 Tube secured with: Tape Dental Injury: Teeth and Oropharynx as per pre-operative assessment

## 2016-04-22 NOTE — Transfer of Care (Signed)
Immediate Anesthesia Transfer of Care Note  Patient: Tiffany Nash  Procedure(s) Performed: Procedure(s): BREAST LUMPECTOMY WITH RADIOACTIVE SEED AND SENTINEL LYMPH NODE BIOPSY (Left)  Patient Location: PACU  Anesthesia Type:GA combined with regional for post-op pain  Level of Consciousness: awake, sedated and patient cooperative  Airway & Oxygen Therapy: Patient Spontanous Breathing and Patient connected to face mask oxygen  Post-op Assessment: Report given to RN and Post -op Vital signs reviewed and stable  Post vital signs: Reviewed and stable  Last Vitals:  Filed Vitals:   04/22/16 1050 04/22/16 1055  BP:    Pulse: 78 80  Temp:    Resp: 15 14    Last Pain: There were no vitals filed for this visit.       Complications: No apparent anesthesia complications

## 2016-04-22 NOTE — Anesthesia Preprocedure Evaluation (Addendum)
Anesthesia Evaluation  Patient identified by MRN, date of birth, ID band Patient awake    Reviewed: Allergy & Precautions, NPO status , Patient's Chart, lab work & pertinent test results  Airway Mallampati: II  TM Distance: >3 FB Neck ROM: Full    Dental  (+) Dental Advisory Given   Pulmonary neg pulmonary ROS,    breath sounds clear to auscultation       Cardiovascular negative cardio ROS   Rhythm:Regular Rate:Normal     Neuro/Psych  Headaches,    GI/Hepatic Neg liver ROS, GERD  ,  Endo/Other  negative endocrine ROS  Renal/GU negative Renal ROS     Musculoskeletal   Abdominal   Peds  Hematology negative hematology ROS (+)   Anesthesia Other Findings   Reproductive/Obstetrics                            Anesthesia Physical Anesthesia Plan  ASA: II  Anesthesia Plan: General and Regional   Post-op Pain Management:    Induction: Intravenous  Airway Management Planned: LMA  Additional Equipment:   Intra-op Plan:   Post-operative Plan: Extubation in OR  Informed Consent: I have reviewed the patients History and Physical, chart, labs and discussed the procedure including the risks, benefits and alternatives for the proposed anesthesia with the patient or authorized representative who has indicated his/her understanding and acceptance.     Plan Discussed with: CRNA  Anesthesia Plan Comments:         Anesthesia Quick Evaluation

## 2016-04-22 NOTE — H&P (Signed)
History of Present Illness Tiffany Kitchen T. Hoxworth MD; 04/15/2016 10:39 AM) The patient is a 55 year old female who presents with breast cancer. She is a 55 YO postmenopausal female referred by Dr. Marcelo Nash for evaluation of recently diagnosed carcinoma of the left breast. she recently presented for a routine exam by her gynecologist, Dr. Sabra Nash, who felt a lump in her left breast. she had had a negative mammogram in March of this year. Subsequent imaging included diagnostic mamogram showing dense breast tissue and an asymmetry or indistinct mass in the upper outer left breast posteriorly, and ultrasound showing a 1 cm oval hypoechoic mass in the upper outer left breast. Lymph nodes appeared normal. An ultrasound guided breast biopsy was performed on 04/08/2016 with pathology revealing invasive ductal carcinoma of the breast. She is seen now in breast multidisciplinary clinic for initial treatment planning. She has experienced no other breast symptoms, specifically pain or skin changes or nipple discharge. She does not have a history of a benign core biopsy of the left beaast in 2012 for calcifications in a different area.  Findings at that time were the following: Tumor size: 1 cm Tumor grade: 1-2 Estrogen Receptor: positive Progesterone Receptor: positive Her-2 neu: negative Lymph node status: negative    Problem List/Past Medical Tiffany Jolly, MD; 04/15/2016 10:41 AM) MIGRAINE SYNDROME (G43.909)  Past Surgical History Tiffany Jolly, MD; 04/15/2016 10:42 AM) Cholecystectomy  Medication History Tiffany Slipper, RN; 04/15/2016 7:56 AM) Medications Reconciled    Review of Systems Tiffany Kitchen T. Hoxworth MD; 04/15/2016 10:46 AM) Neurological Note: occasional headaches   All other systems negative   Physical Exam Tiffany Kitchen T. Hoxworth MD; 04/15/2016 10:43 AM) The physical exam findings are as follows: Note:General: Alert, well-developed and well nourished Caucasian female,  in no distress Skin: Warm and dry without rash or infection. HEENT: No palpable masses or thyromegaly. Sclera nonicteric. Pupils equal round and reactive. Lymph nodes: No cervical, supraclavicular, nodes palpable. Breasts: possible small mass with associated postbiopsy bruising and thickening in the upper outer left breast. Dense breast tissue bilaterally. No other palpable abnormalities or skin changes. No palpable axillary adenopathy. Lungs: Breath sounds clear and equal. No wheezing or increased work of breathing. Cardiovascular: Regular rate and rhythm without murmer. No JVD or edema. Peripheral pulses intact. No carotid bruits. Abdomen: Nondistended. Soft and nontender. No masses palpable. No organomegaly. No palpable hernias. Extremities: No edema or joint swelling or deformity. No chronic venous stasis changes. Neurologic: Alert and fully oriented. Gait normal. No focal weakness. Psychiatric: Normal mood and affect. Thought content appropriate with normal judgement and insight    Assessment & Plan Tiffany Kitchen T. 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 Clinical stage 1 a, ER positive, PR positive, HER-2 nnegative. I discussed with the patient and her husband today initial surgical treatment options. We discussed options of breast conservation with lumpectomy or total mastectomy and sentinal lymph node biopsy/dissection. After discussion they have elected to proceed with breast conservation with lumpectomy and axillary sentinel lymph node biopsy. We discussed the indications and nature of the procedure, and expected recovery, in detail. Surgical risks including anesthetic complications, cardiorespiratory complications, bleeding, infection, wound healing complications, blood clots, lymphedema, local and distant recurrence and possible need for further surgery based  on the final pathology was discussed and understood. Chemotherapy, hormonal therapy and radiation therapy have been discussed. They have been provided with  literature regarding the treatment of breast cancer. All questions were answered. They understand and agree to proceed and we will go ahead with scheduling. Current Plans Pt Education - [facility] Breast Cancer Information Given - [facility] "Breast Journey" Package Pt Education - [facility] Breast Biopsy HCI: discussed with patient and provided information. radioactive leed localized left breast lumpectomy and SLN biopsy under general anesthesia as an outpatient

## 2016-04-23 ENCOUNTER — Encounter (HOSPITAL_BASED_OUTPATIENT_CLINIC_OR_DEPARTMENT_OTHER): Payer: Self-pay | Admitting: General Surgery

## 2016-04-23 NOTE — Addendum Note (Signed)
Addendum  created 04/23/16 [id number] by Tawni Millers, CRNA   Modules edited: Charges VN

## 2016-04-28 ENCOUNTER — Telehealth: Payer: Self-pay | Admitting: *Deleted

## 2016-04-28 NOTE — Telephone Encounter (Signed)
Pt relate doing well and without complaints for sx on 04/22/16. Denies questions or concerns regarding dx or treatment care plan. Discussed oncotype testing will be sent out today and to expect the results will return in 2 wks. Informed pt pending the results her next steps will be radiation vs discussion with Dr. Burr Medico on treatment options. Received verbal understanding. Encourage pt to call with needs.

## 2016-04-28 NOTE — Telephone Encounter (Signed)
Ordered oncotype per Dr. Feng.  Faxed requisition to pathology and confirmed receipt.  

## 2016-05-11 ENCOUNTER — Telehealth: Payer: Self-pay | Admitting: *Deleted

## 2016-05-11 NOTE — Telephone Encounter (Signed)
Discussed next steps with pt in that we are waiting for the Oncotype results to return before sending her to xrt. Informed ERD is on 05/15/16 and that we will call her with the results. If xrt is the next step an appt with Dr. Lisbeth Renshaw will be made followed by [facility] and start of xrt. Denies further needs or questions.

## 2016-05-13 ENCOUNTER — Telehealth: Payer: Self-pay | Admitting: *Deleted

## 2016-05-13 ENCOUNTER — Encounter: Payer: Self-pay | Admitting: Genetic Counselor

## 2016-05-13 NOTE — Telephone Encounter (Signed)
Received Oncotype Score of 17/11% Called pt with results and discussed no chemo. Physician team notified. Confirmed f/u appt with Dr. Lisbeth Renshaw on 8/9 Denies further needs.

## 2016-05-14 ENCOUNTER — Telehealth: Payer: Self-pay | Admitting: *Deleted

## 2016-05-14 NOTE — Telephone Encounter (Signed)
Received Oncotype Dx results of 17/11%.  Placed a copy in Dr. Ernestina Penna box, gave a copy to [place] and took a copy to HIM to scan.

## 2016-05-18 ENCOUNTER — Encounter (HOSPITAL_COMMUNITY): Payer: Self-pay

## 2016-05-20 ENCOUNTER — Ambulatory Visit: Payer: BLUE CROSS/BLUE SHIELD | Admitting: Radiation Oncology

## 2016-05-20 ENCOUNTER — Encounter: Payer: Self-pay | Admitting: Radiation Oncology

## 2016-05-20 ENCOUNTER — Ambulatory Visit: Payer: BLUE CROSS/BLUE SHIELD

## 2016-05-20 NOTE — Progress Notes (Signed)
Location of Breast Cancer:Left Breast  Histology per Pathology Report:  04/22/16 Diagnosis 1. Breast, lumpectomy, Left - INVASIVE DUCTAL CARCINOMA, GRADE I/III, SPANNING 1.2 CM. - DUCTAL CARCINOMA IN SITU, LOW GRADE. - THE SURGICAL RESECTION MARGINS ARE NEGATIVE FOR CARCINOMA. - SEE ONCOLOGY TABLE BELOW. 2. Lymph node, sentinel, biopsy, Left Axillary #1 - THERE IS NO EVIDENCE OF CARCINOMA IN 1 OF 1 LYMPH NODE (0/1). 3. Lymph node, sentinel, biopsy, Left Axillary #2 - THERE IS NO EVIDENCE OF CARCINOMA IN 1 OF 1 LYMPH NODE (0/1). 4. Lymph node, sentinel, biopsy, Left Axillary #3 - THERE IS NO EVIDENCE OF CARCINOMA IN 1 OF 1 LYMPH NODE (0/1).  Receptor Status: ER(100), PR (70), Her2-neu (No amplification), Ki-(15)  On her routine follow-up with her primary care physician a few weeks ago, breast examination reviewed a left breast mass. No skin change or nipple discharge. She feels well, no pain or other complains. She underwent diagnostic mammogram and ultrasound on 04/02/2016, which showed a 1 cm oval mass in the upper outer quadrant of left breast, core needle biopsy showed invasive ductal carcinoma and DCIS, ER/PR positive, HER-2 negative.  Past/Anticipated interventions by surgeon, if any:Dr. Excell Seltzer: Lumpectomy left Breast with Axillary Node Dissection  Past/Anticipated interventions by medical oncology, if any: Oncotype Dx 17  Lymphedema issues, if any: None   Pain issues, if any: No.  Occasional "twinges"  SAFETY ISSUES:  Prior radiation? No  Pacemaker/ICD? No  Possible current pregnancy?No  Is the patient on methotrexate? Mo  Current Complaints / other details:    Menarchal: 13 LMP: 2014 Contraceptive: 6 years  HRT: no G7P4: she did breast feeding for 6 weeks for each children, ago of 28-19, 2 daughters      Deirdre Evener, RN 05/20/2016,5:34 PM

## 2016-05-21 ENCOUNTER — Ambulatory Visit
Admission: RE | Admit: 2016-05-21 | Discharge: 2016-05-21 | Disposition: A | Discharge status: Home / Self Care | Payer: BLUE CROSS/BLUE SHIELD | Source: Ambulatory Visit | Attending: Radiation Oncology | Admitting: Radiation Oncology

## 2016-05-21 ENCOUNTER — Encounter: Payer: Self-pay | Admitting: Radiation Oncology

## 2016-05-21 DIAGNOSIS — C50412 Malignant neoplasm of upper-outer quadrant of left female breast: Secondary | ICD-10-CM

## 2016-05-21 DIAGNOSIS — Z880 Allergy status to penicillin: Secondary | ICD-10-CM | POA: Diagnosis not present

## 2016-05-21 DIAGNOSIS — Z510 Encounter for antineoplastic radiation therapy: Secondary | ICD-10-CM | POA: Diagnosis present

## 2016-05-21 DIAGNOSIS — Z79899 Other long term (current) drug therapy: Secondary | ICD-10-CM | POA: Insufficient documentation

## 2016-05-21 DIAGNOSIS — Z170 Estrogen receptor positive status [ER+]: Secondary | ICD-10-CM | POA: Diagnosis not present

## 2016-05-21 NOTE — Progress Notes (Signed)
  Radiation Oncology         ([phone]) [phone] ________________________________  Name: Tiffany Nash MRN: [id number]  Date: 05/21/2016  DOB: [birth year]  DIAGNOSIS:     ICD-9-CM ICD-10-CM   1. Breast cancer of upper-outer quadrant of left female breast ([place]) 174.4 C50.412      SIMULATION AND TREATMENT PLANNING NOTE  The patient presented for simulation prior to beginning her course of radiation treatment for her diagnosis of Left-sided breast cancer. The patient was placed in a supine position on a breast board. A customized vac-lock bag was constructed and this complex treatment device will be used on a daily basis during her treatment. In this fashion, a CT scan was obtained through the chest area and an isocenter was placed near the chest wall within the breast.  The patient will be planned to receive a course of radiation initially to a dose of 50.4 Gy. This will consist of a whole breast radiotherapy technique. To accomplish this, 2 customized blocks have been designed which will correspond to medial and lateral whole breast tangent fields. This treatment will be accomplished at 1.8 Gy per fraction. A forward planning technique will also be evaluated to determine if this approach improves the plan. It is anticipated that the patient will then receive a 10 Gy boost to the seroma cavity which has been contoured. This will be accomplished at 2 Gy per fraction.   This initial treatment will consist of a 3-D conformal technique. The seroma has been contoured as the primary target structure. Additionally, dose volume histograms of both this target as well as the lungs and heart will also be evaluated. Such an approach is necessary to ensure that the target area is adequately covered while the nearby critical  normal structures are adequately spared.  Plan:  The final anticipated total dose therefore will correspond to 60.4 Gy.   Special treatment procedure was performed today due to the extra time  and effort required by myself to plan and prepare this patient for deep inspiration breath hold technique.  I have determined cardiac sparing to be of benefit to this patient to prevent long term cardiac damage due to radiation of the heart.  Bellows were placed on the patient's abdomen. To facilitate cardiac sparing, the patient was coached by the radiation therapists on breath hold techniques and breathing practice was performed. Practice waveforms were obtained. The patient was then scanned while maintaining breath hold in the treatment position.  This image was then transferred over to the imaging specialist. The imaging specialist then created a fusion of the free breathing and breath hold scans using the chest wall as the stable structure. I personally reviewed the fusion in axial, coronal and sagittal image planes.  Excellent cardiac sparing was obtained.  I felt the patient is an appropriate candidate for breath hold and the patient will be treated as such.  The image fusion was then reviewed with the patient to reinforce the necessity of reproducible breath hold.      _______________________________   Jodelle Gross, MD, PhD

## 2016-05-21 NOTE — Progress Notes (Signed)
Radiation Oncology         [phone] ________________________________  Name: Tiffany Nash MRN: [id number]  Date: 05/21/2016  DOB: April 26, 1961  IN:[facility],NOBSJ, MD  Excell Seltzer, MD     REFERRING PHYSICIAN: Excell Seltzer, MD   DIAGNOSIS: The encounter diagnosis was Breast cancer of upper-outer quadrant of left female breast ([place]).   HISTORY OF PRESENT ILLNESS: Tiffany Nash is a 55 y.o. female with a new diagnosis of left breast cancer. The patient had a screening mammogram at [facility] on 12/16/15 with Breast Composition Category C: Heterogeneously Dense and BI-RADS Category 2: Benign. The patient had her annual physical exam per Dr. Sabra Heck on 03/24/16 who palpated a 1 cm lesion at the 2 o'clock position of the left breast, 7 cm from the nipple. Dr. Sabra Heck ordered diagnostic imaging of the left breast due to that finding.  The patient had a diagnostic mammogram and ultrasound on 04/02/16. This found a 1 cm mass at the 1 o'clock position of the left breast with no left axillary lymphadenopathy. The patient had a biopsy of the left breast on 04/08/16. 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  A lumpectomy on 04/22/16 with sentinel evaluation revealed an invasive ductal carcinoma, grade 1 measuring 1.2 cm, and DCIS. Resection margins were negative for tumor, and her axillary nodes were negative for disease.  Her oncotype result of 17 does not require systemic therapy and she comes today in preparation of radiotherapy planning.  PREVIOUS RADIATION THERAPY: No  PAST MEDICAL HISTORY:  Past Medical History:  Diagnosis Date  . Abnormal Pap smear   . Breast cancer ([place])   . Elevated hemoglobin A1c June 2017   5.9  . GERD (gastroesophageal reflux disease)   . Migraine    uses imitrex as needed  . Schatzki's ring    and hiatal hernia on EGD  . Shingles rash 07/2015    shingles on right rib  cage   . TMJ (temporomandibular joint syndrome)   . Vertigo        PAST SURGICAL HISTORY: Past Surgical History:  Procedure Laterality Date  . BREAST BIOPSY  1/12   fibrocystic changes   . BREAST LUMPECTOMY WITH RADIOACTIVE SEED AND SENTINEL LYMPH NODE BIOPSY Left 04/22/2016   Procedure: BREAST LUMPECTOMY WITH RADIOACTIVE SEED AND SENTINEL LYMPH NODE BIOPSY;  Surgeon: Excell Seltzer, MD;  Location: [facility];  Service: General;  Laterality: Left;  . DILATION AND CURETTAGE OF UTERUS  1991  . ESOPHAGEAL DILATION    . GYNECOLOGIC CRYOSURGERY  1980s  . LAPAROSCOPIC CHOLECYSTECTOMY  1992  . WISDOM TOOTH EXTRACTION       FAMILY HISTORY:  Family History  Problem Relation Age of Onset  . Diabetes Father   . Goiter Mother   . Heart disease       SOCIAL HISTORY:  reports that she has never smoked. She has never used smokeless tobacco. She reports that she drinks about 2.4 - 3.0 oz of alcohol per week . She reports that she does not use drugs. The patient is married and accompanied by her husband. She lives in [place].    ALLERGIES: Penicillins and Tylox [oxycodone-acetaminophen]   MEDICATIONS:  Current Outpatient Prescriptions  Medication Sig Dispense Refill  . Acetaminophen (TYLENOL PO) Take 1 tablet by mouth as needed. Reported on 04/15/2016    . famotidine (PEPCID) 20 MG tablet Take 1 tablet (20 mg total) by mouth  as needed. 30 tablet 11  . ibuprofen (ADVIL,MOTRIN) 800 MG tablet Take 400 mg by mouth as needed for pain.     . NON FORMULARY at bedtime as needed. Herb Complex.    . Nutritional Supplements (JUICE PLUS FIBRE PO) Take by mouth at bedtime as needed.     . SUMAtriptan (IMITREX) 100 MG tablet take 1 tablet by mouth if needed for migraines MAY REPEAT IN 2 HOURS IF HEADACHE PERSISTS 9 tablet 6  . HYDROcodone-acetaminophen (NORCO/VICODIN) 5-325 MG tablet Take 1-2 tablets by mouth every 4 (four) hours as needed for moderate pain or severe pain. (Patient  not taking: Reported on 05/21/2016) 25 tablet 0   No current facility-administered medications for this encounter.      REVIEW OF SYSTEMS: On review of systems, the patient reports that she is doing well overall. She denies any chest pain, shortness of breath, cough, fevers, chills, night sweats, unintended weight changes. Her recovery has gone well and she is not concerned about her incision site. She denies any bowel or bladder disturbances, and denies abdominal pain, nausea or vomiting. She denies any new musculoskeletal or joint aches or pains. A complete review of systems is obtained and is otherwise negative.      PHYSICAL EXAM:  BP 101/61 (BP Location: Right Arm, Patient Position: Sitting, Cuff Size: Normal)   Pulse 72   Temp 97.8 F (36.6 C)   Ht 5' 5"  (1.651 m)   Wt 141 lb (64 kg)   LMP 05/12/2012   BMI 23.46 kg/m  Pain scale 0/10 In general this is a well appearing Caucasian female in no acute distress. She is alert and oriented x4 and appropriate throughout the examination. HEENT reveals that the patient is normocephalic, atraumatic. Cardiopulmonary assessment is negative for acute distress and she exhibits normal effort. The left breast incision is intact without edema, erythema, or separation. The chest wall and left upper extremity are negative for lymphedema.    ECOG = 0  0 - Asymptomatic (Fully active, able to carry on all predisease activities without restriction)  1 - Symptomatic but completely ambulatory (Restricted in physically strenuous activity but ambulatory and able to carry out work of a light or sedentary nature. For example, light housework, office work)  2 - Symptomatic, <50% in bed during the day (Ambulatory and capable of all self care but unable to carry out any work activities. Up and about more than 50% of waking hours)  3 - Symptomatic, >50% in bed, but not bedbound (Capable of only limited self-care, confined to bed or chair 50% or more of waking  hours)  4 - Bedbound (Completely disabled. Cannot carry on any self-care. Totally confined to bed or chair)  5 - Death   Eustace Pen MM, Creech RH, Tormey DC, et al. [phone]). "Toxicity and response criteria of the [facility]". [place] Oncol. 5 (6): 649-55    LABORATORY DATA:  Lab Results  Component Value Date   WBC 5.9 04/15/2016   HGB 13.1 04/15/2016   HCT 39.9 04/15/2016   MCV 96.0 04/15/2016   PLT 298 04/15/2016   Lab Results  Component Value Date   NA 141 04/15/2016   K 4.2 04/15/2016   CL 104 03/24/2016   CO2 27 04/15/2016   Lab Results  Component Value Date   ALT 16 04/15/2016   AST 16 04/15/2016   ALKPHOS 68 04/15/2016   BILITOT 0.43 04/15/2016      RADIOGRAPHY: Nm Sentinel Node Inj-no  Rpt (breast)  Result Date: 04/22/2016 CLINICAL DATA: cancer left breast Sulfur colloid was injected intradermally by the nuclear medicine technologist for breast cancer sentinel node localization.       IMPRESSION: Clinical Stage IA, pT1b, N0, M0, ER/PR positive, Her-2 negative invasive ductal carcinoma and DCIS of the left breast.   PLAN: Dr. Lisbeth Renshaw reviews the pathology findings and reviews the nature of in invasive breast disease. Fortunately her oncotype testing does not require her to receive systemic therapy and we would now recommend proceeding with external radiotherapy. Again, Dr. Lisbeth Renshaw discusses the rationale for 33 fractions over 6 1/2 weeks of treatment as an outpatient. Following radiotherapy to the breast, the patient will meet back with Dr. Burr Medico to discuss the role of antiestrogen therapy. We discussed the details of radiotherapy including logistics and delivery, the risks, benefits, short, and long term effects of therapy. She is interested in moving forward with radiation treatment when clinically appropriate. We discussed the utilization of breath hold technique given her left sided tumor. She has signed written consent and is planning to proceed  with CT simulation today.     The above documentation reflects my direct findings during this shared patient visit. Please see the separate note by Dr. Lisbeth Renshaw on this date for the remainder of the patient's plan of care.    Carola Rhine, PAC

## 2016-05-21 NOTE — Addendum Note (Signed)
Encounter addended by: Benn Moulder, RN on: 05/21/2016  1:57 PM<BR>    Actions taken: Visit Navigator Flowsheet section accepted

## 2016-05-21 NOTE — Progress Notes (Signed)
  Radiation Oncology         [phone]) [phone] ________________________________  Name: Tiffany Nash MRN: [id number]  Date: 05/21/2016  DOB: 10-03-1961  Optical Surface Tracking Plan:  Since intensity modulated radiotherapy (IMRT) and 3D conformal radiation treatment methods are predicated on accurate and precise positioning for treatment, intrafraction motion monitoring is medically necessary to ensure accurate and safe treatment delivery.  The ability to quantify intrafraction motion without excessive ionizing radiation dose can only be performed with optical surface tracking. Accordingly, surface imaging offers the opportunity to obtain 3D measurements of patient position throughout IMRT and 3D treatments without excessive radiation exposure.  I am ordering optical surface tracking for this patient's upcoming course of radiotherapy. ________________________________  Kyung Rudd, MD 05/21/2016 3:56 PM    Reference:   Ursula Alert, J, et al. Surface imaging-based analysis of intrafraction motion for breast radiotherapy patients.Journal of [place], n. 6, nov. 2014. ISSN [id number].   Available at: <http://www.jacmp.org/index.php/jacmp/article/view/4957>.

## 2016-05-22 DIAGNOSIS — Z510 Encounter for antineoplastic radiation therapy: Secondary | ICD-10-CM | POA: Diagnosis not present

## 2016-05-25 ENCOUNTER — Ambulatory Visit
Admission: RE | Admit: 2016-05-25 | Discharge: 2016-05-25 | Disposition: A | Discharge status: Home / Self Care | Payer: BLUE CROSS/BLUE SHIELD | Source: Ambulatory Visit | Attending: Radiation Oncology | Admitting: Radiation Oncology

## 2016-05-25 DIAGNOSIS — Z510 Encounter for antineoplastic radiation therapy: Secondary | ICD-10-CM | POA: Diagnosis not present

## 2016-05-26 ENCOUNTER — Ambulatory Visit
Admission: RE | Admit: 2016-05-26 | Discharge: 2016-05-26 | Disposition: A | Discharge status: Home / Self Care | Payer: BLUE CROSS/BLUE SHIELD | Source: Ambulatory Visit | Attending: Radiation Oncology | Admitting: Radiation Oncology

## 2016-05-26 DIAGNOSIS — C50412 Malignant neoplasm of upper-outer quadrant of left female breast: Secondary | ICD-10-CM | POA: Diagnosis present

## 2016-05-26 DIAGNOSIS — Z510 Encounter for antineoplastic radiation therapy: Secondary | ICD-10-CM | POA: Diagnosis not present

## 2016-05-26 MED ORDER — RADIAPLEXRX EX GEL
Freq: Once | CUTANEOUS | Status: AC
  Administered 2016-05-26: 13:00:00 via TOPICAL

## 2016-05-26 MED ORDER — ALRA NON-METALLIC DEODORANT (RAD-ONC)
1.0000 "application " | Freq: Once | TOPICAL | Status: AC
  Administered 2016-05-26: 1 via TOPICAL

## 2016-05-26 NOTE — Addendum Note (Signed)
Encounter addended by: Doreen Beam, RN on: 05/26/2016  1:35 PM<BR>    Actions taken: Patient Education assessment filed

## 2016-05-26 NOTE — Progress Notes (Signed)
Pt education done, radiation therapy and you book, my business card, alra deodorant and radiaplex cream, discussed ways to manage side effects, skin iriation, swelling/soreness of breast, pain, fatigue,  Use of electric razor only, increase protein in diet, stay hydrated, drink plenty water, verbal understanding, teach back given 1:33 PM

## 2016-05-27 ENCOUNTER — Ambulatory Visit
Admission: RE | Admit: 2016-05-27 | Discharge: 2016-05-27 | Disposition: A | Discharge status: Home / Self Care | Payer: BLUE CROSS/BLUE SHIELD | Source: Ambulatory Visit | Attending: Radiation Oncology | Admitting: Radiation Oncology

## 2016-05-27 DIAGNOSIS — Z510 Encounter for antineoplastic radiation therapy: Secondary | ICD-10-CM | POA: Diagnosis not present

## 2016-05-28 ENCOUNTER — Ambulatory Visit
Admission: RE | Admit: 2016-05-28 | Discharge: 2016-05-28 | Disposition: A | Discharge status: Home / Self Care | Payer: BLUE CROSS/BLUE SHIELD | Source: Ambulatory Visit | Attending: Radiation Oncology | Admitting: Radiation Oncology

## 2016-05-28 DIAGNOSIS — Z510 Encounter for antineoplastic radiation therapy: Secondary | ICD-10-CM | POA: Diagnosis not present

## 2016-05-29 ENCOUNTER — Encounter: Payer: Self-pay | Admitting: Radiation Oncology

## 2016-05-29 ENCOUNTER — Ambulatory Visit
Admission: RE | Admit: 2016-05-29 | Discharge: 2016-05-29 | Disposition: A | Discharge status: Home / Self Care | Payer: BLUE CROSS/BLUE SHIELD | Source: Ambulatory Visit | Attending: Radiation Oncology | Admitting: Radiation Oncology

## 2016-05-29 VITALS — BP 105/76 | HR 79 | Temp 98.4°F | Resp 20 | Wt 136.0 lb

## 2016-05-29 DIAGNOSIS — C50412 Malignant neoplasm of upper-outer quadrant of left female breast: Secondary | ICD-10-CM

## 2016-05-29 DIAGNOSIS — Z510 Encounter for antineoplastic radiation therapy: Secondary | ICD-10-CM | POA: Diagnosis not present

## 2016-05-29 NOTE — Progress Notes (Signed)
   Department of Radiation Oncology  Phone:  [phone] Fax:        [phone]  Weekly Treatment Note    Name: Tiffany Nash Date: 05/29/2016 MRN: [id number] DOB: [birth year]   Diagnosis:     ICD-9-CM ICD-10-CM   1. Breast cancer of upper-outer quadrant of left female breast ([place]) 174.4 C50.412      Current dose: 7.2 Gy  Current fraction: 4   MEDICATIONS: Current Outpatient Prescriptions  Medication Sig Dispense Refill  . Acetaminophen (TYLENOL PO) Take 1 tablet by mouth as needed. Reported on 04/15/2016    . famotidine (PEPCID) 20 MG tablet Take 1 tablet (20 mg total) by mouth as needed. 30 tablet 11  . hyaluronate sodium (RADIAPLEXRX) GEL Apply 1 application topically 2 (two) times daily.    Marland Kitchen ibuprofen (ADVIL,MOTRIN) 800 MG tablet Take 400 mg by mouth as needed for pain.     . NON FORMULARY at bedtime as needed. Herb Complex.    . non-metallic deodorant Jethro Poling) MISC Apply 1 application topically daily as needed.    . Nutritional Supplements (JUICE PLUS FIBRE PO) Take by mouth at bedtime as needed.     . SUMAtriptan (IMITREX) 100 MG tablet take 1 tablet by mouth if needed for migraines MAY REPEAT IN 2 HOURS IF HEADACHE PERSISTS 9 tablet 6   No current facility-administered medications for this encounter.      ALLERGIES: Penicillins and Tylox [oxycodone-acetaminophen]   LABORATORY DATA:  Lab Results  Component Value Date   WBC 5.9 04/15/2016   HGB 13.1 04/15/2016   HCT 39.9 04/15/2016   MCV 96.0 04/15/2016   PLT 298 04/15/2016   Lab Results  Component Value Date   NA 141 04/15/2016   K 4.2 04/15/2016   CL 104 03/24/2016   CO2 27 04/15/2016   Lab Results  Component Value Date   ALT 16 04/15/2016   AST 16 04/15/2016   ALKPHOS 68 04/15/2016   BILITOT 0.43 04/15/2016     NARRATIVE: Tiffany Nash was seen today for weekly treatment management. The chart was checked and the patient's films were reviewed.  Weekly rad txs left breast 4/33 completed,   No skin changes, using radiaplex   Bid, appetite good, no c/o 2:12 PM BP 105/76 (BP Location: Right Arm, Patient Position: Sitting, Cuff Size: Normal)   Pulse 79   Temp 98.4 F (36.9 C) (Oral)   Resp 20   Wt 136 lb (61.7 kg)   LMP 05/12/2012   SpO2 100%   BMI 22.63 kg/m   Wt Readings from Last 3 Encounters:  05/29/16 136 lb (61.7 kg)  05/21/16 141 lb (64 kg)  04/22/16 138 lb (62.6 kg)    PHYSICAL EXAMINATION: weight is 136 lb (61.7 kg). Her oral temperature is 98.4 F (36.9 C). Her blood pressure is 105/76 and her pulse is 79. Her respiration is 20 and oxygen saturation is 100%.        ASSESSMENT: The patient is doing satisfactorily with treatment.  PLAN: We will continue with the patient's radiation treatment as planned.

## 2016-05-29 NOTE — Progress Notes (Signed)
Weekly rad txs left breast 4/33 completed,  No skin changes, using radiaplex   Bid, appetite good, no c/o 12:24 PM BP 105/76 (BP Location: Right Arm, Patient Position: Sitting, Cuff Size: Normal)   Pulse 79   Temp 98.4 F (36.9 C) (Oral)   Resp 20   Wt 136 lb (61.7 kg)   LMP 05/12/2012   SpO2 100%   BMI 22.63 kg/m   Wt Readings from Last 3 Encounters:  05/29/16 136 lb (61.7 kg)  05/21/16 141 lb (64 kg)  04/22/16 138 lb (62.6 kg)

## 2016-06-01 ENCOUNTER — Ambulatory Visit: Payer: BLUE CROSS/BLUE SHIELD

## 2016-06-02 ENCOUNTER — Ambulatory Visit
Admission: RE | Admit: 2016-06-02 | Discharge: 2016-06-02 | Disposition: A | Discharge status: Home / Self Care | Payer: BLUE CROSS/BLUE SHIELD | Source: Ambulatory Visit | Attending: Radiation Oncology | Admitting: Radiation Oncology

## 2016-06-02 DIAGNOSIS — Z510 Encounter for antineoplastic radiation therapy: Secondary | ICD-10-CM | POA: Diagnosis not present

## 2016-06-03 ENCOUNTER — Ambulatory Visit
Admission: RE | Admit: 2016-06-03 | Discharge: 2016-06-03 | Disposition: A | Discharge status: Home / Self Care | Payer: BLUE CROSS/BLUE SHIELD | Source: Ambulatory Visit | Attending: Radiation Oncology | Admitting: Radiation Oncology

## 2016-06-03 DIAGNOSIS — Z510 Encounter for antineoplastic radiation therapy: Secondary | ICD-10-CM | POA: Diagnosis not present

## 2016-06-04 ENCOUNTER — Ambulatory Visit
Admission: RE | Admit: 2016-06-04 | Discharge: 2016-06-04 | Disposition: A | Discharge status: Home / Self Care | Payer: BLUE CROSS/BLUE SHIELD | Source: Ambulatory Visit | Attending: Radiation Oncology | Admitting: Radiation Oncology

## 2016-06-04 ENCOUNTER — Telehealth: Payer: Self-pay | Admitting: *Deleted

## 2016-06-04 DIAGNOSIS — Z510 Encounter for antineoplastic radiation therapy: Secondary | ICD-10-CM | POA: Diagnosis not present

## 2016-06-04 NOTE — Telephone Encounter (Signed)
Called pt to assess needs during xrt. Relate doing well and without complaints. Encourage pt to call with questions or concerns. Received verbal understanding. Contact information provided.

## 2016-06-05 ENCOUNTER — Encounter: Payer: Self-pay | Admitting: Radiation Oncology

## 2016-06-05 ENCOUNTER — Ambulatory Visit
Admission: RE | Admit: 2016-06-05 | Discharge: 2016-06-05 | Disposition: A | Discharge status: Home / Self Care | Payer: BLUE CROSS/BLUE SHIELD | Source: Ambulatory Visit | Attending: Radiation Oncology | Admitting: Radiation Oncology

## 2016-06-05 VITALS — BP 111/77 | HR 70 | Temp 97.7°F | Resp 20 | Wt 143.0 lb

## 2016-06-05 DIAGNOSIS — Z510 Encounter for antineoplastic radiation therapy: Secondary | ICD-10-CM | POA: Diagnosis not present

## 2016-06-05 DIAGNOSIS — C50412 Malignant neoplasm of upper-outer quadrant of left female breast: Secondary | ICD-10-CM

## 2016-06-05 NOTE — Progress Notes (Signed)
Weekly rad txs left breast  8/33 completed, mild erythema , skin intact, no c/o pain or discomfort, radiaplex bid, appetite good 2:30 PM BP 111/77 (BP Location: Left Arm, Patient Position: Sitting, Cuff Size: Normal)   Pulse 70   Temp 97.7 F (36.5 C) (Oral)   Resp 20   Wt 143 lb (64.9 kg)   LMP 05/12/2012   BMI 23.80 kg/m   Wt Readings from Last 3 Encounters:  06/05/16 143 lb (64.9 kg)  05/29/16 136 lb (61.7 kg)  05/21/16 141 lb (64 kg)

## 2016-06-05 NOTE — Progress Notes (Signed)
   Department of Radiation Oncology  Phone:  [phone] Fax:        [phone]  Weekly Treatment Note    Name: Tiffany Nash Date: 06/07/2016 MRN: [id number] DOB: [birth year]   Diagnosis:     ICD-9-CM ICD-10-CM   1. Breast cancer of upper-outer quadrant of left female breast ([place]) 174.4 C50.412      Current dose: 14.4 Gy  Current fraction: 8   MEDICATIONS: Current Outpatient Prescriptions  Medication Sig Dispense Refill  . Acetaminophen (TYLENOL PO) Take 1 tablet by mouth as needed. Reported on 04/15/2016    . famotidine (PEPCID) 20 MG tablet Take 1 tablet (20 mg total) by mouth as needed. 30 tablet 11  . hyaluronate sodium (RADIAPLEXRX) GEL Apply 1 application topically 2 (two) times daily.    Marland Kitchen ibuprofen (ADVIL,MOTRIN) 800 MG tablet Take 400 mg by mouth as needed for pain.     . NON FORMULARY at bedtime as needed. Herb Complex.    . non-metallic deodorant Jethro Poling) MISC Apply 1 application topically daily as needed.    . Nutritional Supplements (JUICE PLUS FIBRE PO) Take by mouth at bedtime as needed.     . SUMAtriptan (IMITREX) 100 MG tablet take 1 tablet by mouth if needed for migraines MAY REPEAT IN 2 HOURS IF HEADACHE PERSISTS 9 tablet 6   No current facility-administered medications for this encounter.      ALLERGIES: Penicillins and Tylox [oxycodone-acetaminophen]   LABORATORY DATA:  Lab Results  Component Value Date   WBC 5.9 04/15/2016   HGB 13.1 04/15/2016   HCT 39.9 04/15/2016   MCV 96.0 04/15/2016   PLT 298 04/15/2016   Lab Results  Component Value Date   NA 141 04/15/2016   K 4.2 04/15/2016   CL 104 03/24/2016   CO2 27 04/15/2016   Lab Results  Component Value Date   ALT 16 04/15/2016   AST 16 04/15/2016   ALKPHOS 68 04/15/2016   BILITOT 0.43 04/15/2016     NARRATIVE: Tiffany Nash was seen today for weekly treatment management. The chart was checked and the patient's films were reviewed.  Weekly rad txs left breast  8/33 completed.  The nurse notes the patient's skin is intact and mild erythema. She has no complaints of pain or discomfort, radiaplex bid, appetite good.  8:02 PM BP 111/77 (BP Location: Left Arm, Patient Position: Sitting, Cuff Size: Normal)   Pulse 70   Temp 97.7 F (36.5 C) (Oral)   Resp 20   Wt 143 lb (64.9 kg)   LMP 05/12/2012   BMI 23.80 kg/m   Wt Readings from Last 3 Encounters:  06/05/16 143 lb (64.9 kg)  05/29/16 136 lb (61.7 kg)  05/21/16 141 lb (64 kg)    PHYSICAL EXAMINATION: weight is 143 lb (64.9 kg). Her oral temperature is 97.7 F (36.5 C). Her blood pressure is 111/77 and her pulse is 70. Her respiration is 20.        ASSESSMENT: The patient is doing satisfactorily with treatment.  PLAN: We will continue with the patient's radiation treatment as planned.   This document serves as a record of services personally performed by Kyung Rudd, MD. It was created on his behalf by Darcus Austin, a trained medical scribe. The creation of this record is based on the scribe's personal observations and the provider's statements to them. This document has been checked and approved by the attending provider.

## 2016-06-08 ENCOUNTER — Ambulatory Visit
Admission: RE | Admit: 2016-06-08 | Discharge: 2016-06-08 | Disposition: A | Discharge status: Home / Self Care | Payer: BLUE CROSS/BLUE SHIELD | Source: Ambulatory Visit | Attending: Radiation Oncology | Admitting: Radiation Oncology

## 2016-06-08 DIAGNOSIS — Z510 Encounter for antineoplastic radiation therapy: Secondary | ICD-10-CM | POA: Diagnosis not present

## 2016-06-09 ENCOUNTER — Ambulatory Visit
Admission: RE | Admit: 2016-06-09 | Discharge: 2016-06-09 | Disposition: A | Discharge status: Home / Self Care | Payer: BLUE CROSS/BLUE SHIELD | Source: Ambulatory Visit | Attending: Radiation Oncology | Admitting: Radiation Oncology

## 2016-06-09 DIAGNOSIS — Z510 Encounter for antineoplastic radiation therapy: Secondary | ICD-10-CM | POA: Diagnosis not present

## 2016-06-10 ENCOUNTER — Ambulatory Visit
Admission: RE | Admit: 2016-06-10 | Discharge: 2016-06-10 | Disposition: A | Discharge status: Home / Self Care | Payer: BLUE CROSS/BLUE SHIELD | Source: Ambulatory Visit | Attending: Radiation Oncology | Admitting: Radiation Oncology

## 2016-06-10 DIAGNOSIS — Z510 Encounter for antineoplastic radiation therapy: Secondary | ICD-10-CM | POA: Diagnosis not present

## 2016-06-11 ENCOUNTER — Ambulatory Visit
Admission: RE | Admit: 2016-06-11 | Discharge: 2016-06-11 | Disposition: A | Discharge status: Home / Self Care | Payer: BLUE CROSS/BLUE SHIELD | Source: Ambulatory Visit | Attending: Radiation Oncology | Admitting: Radiation Oncology

## 2016-06-11 DIAGNOSIS — Z510 Encounter for antineoplastic radiation therapy: Secondary | ICD-10-CM | POA: Diagnosis not present

## 2016-06-12 ENCOUNTER — Ambulatory Visit
Admission: RE | Admit: 2016-06-12 | Discharge: 2016-06-12 | Disposition: A | Discharge status: Home / Self Care | Payer: BLUE CROSS/BLUE SHIELD | Source: Ambulatory Visit | Attending: Radiation Oncology | Admitting: Radiation Oncology

## 2016-06-12 ENCOUNTER — Encounter: Payer: Self-pay | Admitting: Radiation Oncology

## 2016-06-12 VITALS — BP 105/67 | HR 83 | Temp 98.4°F | Resp 16 | Wt 140.9 lb

## 2016-06-12 DIAGNOSIS — C50412 Malignant neoplasm of upper-outer quadrant of left female breast: Secondary | ICD-10-CM | POA: Insufficient documentation

## 2016-06-12 DIAGNOSIS — Z510 Encounter for antineoplastic radiation therapy: Secondary | ICD-10-CM | POA: Diagnosis not present

## 2016-06-12 MED ORDER — RADIAPLEXRX EX GEL
Freq: Once | CUTANEOUS | Status: AC
  Administered 2016-06-12: 15:00:00 via TOPICAL

## 2016-06-12 NOTE — Progress Notes (Signed)
   Department of Radiation Oncology  Phone:  [phone] Fax:        [phone]  Weekly Treatment Note    Name: Tiffany Nash Date: 06/12/2016 MRN: [id number] DOB: Apr 20, 1961   Diagnosis:     ICD-9-CM ICD-10-CM   1. Breast cancer of upper-outer quadrant of left female breast ([facility]) 174.4 C50.412 hyaluronate sodium (RADIAPLEXRX) gel     Current dose: 23.4 Gy  Current fraction:13   MEDICATIONS: Current Outpatient Prescriptions  Medication Sig Dispense Refill  . Acetaminophen (TYLENOL PO) Take 1 tablet by mouth as needed. Reported on 04/15/2016    . famotidine (PEPCID) 20 MG tablet Take 1 tablet (20 mg total) by mouth as needed. 30 tablet 11  . hyaluronate sodium (RADIAPLEXRX) GEL Apply 1 application topically 2 (two) times daily.    Marland Kitchen ibuprofen (ADVIL,MOTRIN) 800 MG tablet Take 400 mg by mouth as needed for pain.     . NON FORMULARY at bedtime as needed. Herb Complex.    . non-metallic deodorant Jethro Poling) MISC Apply 1 application topically daily as needed.    . Nutritional Supplements (JUICE PLUS FIBRE PO) Take by mouth at bedtime as needed.     . SUMAtriptan (IMITREX) 100 MG tablet take 1 tablet by mouth if needed for migraines MAY REPEAT IN 2 HOURS IF HEADACHE PERSISTS 9 tablet 6   No current facility-administered medications for this encounter.      ALLERGIES: Penicillins and Tylox [oxycodone-acetaminophen]   LABORATORY DATA:  Lab Results  Component Value Date   WBC 5.9 04/15/2016   HGB 13.1 04/15/2016   HCT 39.9 04/15/2016   MCV 96.0 04/15/2016   PLT 298 04/15/2016   Lab Results  Component Value Date   NA 141 04/15/2016   K 4.2 04/15/2016   CL 104 03/24/2016   CO2 27 04/15/2016   Lab Results  Component Value Date   ALT 16 04/15/2016   AST 16 04/15/2016   ALKPHOS 68 04/15/2016   BILITOT 0.43 04/15/2016     NARRATIVE: Tiffany Nash was seen today for weekly treatment management. The chart was checked and the patient's films were reviewed.  Weekly  rad txs left breast 13/33 completed, ,  Mild erythema  Skin intact,  Using radaiplex bid, appetite, mild fatigue, occasional achiness in breast 2:45 PM BP 105/67 (BP Location: Right Arm, Patient Position: Sitting, Cuff Size: Normal)   Pulse 83   Temp 98.4 F (36.9 C) (Oral)   Resp 16   Wt 140 lb 14.4 oz (63.9 kg)   LMP 05/12/2012   BMI 23.45 kg/m   Wt Readings from Last 3 Encounters:  06/12/16 140 lb 14.4 oz (63.9 kg)  06/05/16 143 lb (64.9 kg)  05/29/16 136 lb (61.7 kg)    PHYSICAL EXAMINATION: weight is 140 lb 14.4 oz (63.9 kg). Her oral temperature is 98.4 F (36.9 C). Her blood pressure is 105/67 and her pulse is 83. Her respiration is 16.      Mild erythema in the treatment area. No desquamation.  ASSESSMENT: The patient is doing satisfactorily with treatment.  PLAN: We will continue with the patient's radiation treatment as planned.

## 2016-06-12 NOTE — Progress Notes (Signed)
Weekly rad txs left breast 13/33 completed, ,  Mild erythema  Skin intact,  Using radaiplex bid, appetite, mild fatigue, occasional achiness in breast 2:14 PM BP 105/67 (BP Location: Right Arm, Patient Position: Sitting, Cuff Size: Normal)   Pulse 83   Temp 98.4 F (36.9 C) (Oral)   Resp 16   Wt 140 lb 14.4 oz (63.9 kg)   LMP 05/12/2012   BMI 23.45 kg/m   Wt Readings from Last 3 Encounters:  06/12/16 140 lb 14.4 oz (63.9 kg)  06/05/16 143 lb (64.9 kg)  05/29/16 136 lb (61.7 kg)

## 2016-06-16 ENCOUNTER — Ambulatory Visit
Admission: RE | Admit: 2016-06-16 | Discharge: 2016-06-16 | Disposition: A | Discharge status: Home / Self Care | Payer: BLUE CROSS/BLUE SHIELD | Source: Ambulatory Visit | Attending: Radiation Oncology | Admitting: Radiation Oncology

## 2016-06-16 DIAGNOSIS — Z510 Encounter for antineoplastic radiation therapy: Secondary | ICD-10-CM | POA: Diagnosis not present

## 2016-06-17 ENCOUNTER — Ambulatory Visit
Admission: RE | Admit: 2016-06-17 | Discharge: 2016-06-17 | Disposition: A | Discharge status: Home / Self Care | Payer: BLUE CROSS/BLUE SHIELD | Source: Ambulatory Visit | Attending: Radiation Oncology | Admitting: Radiation Oncology

## 2016-06-17 DIAGNOSIS — Z510 Encounter for antineoplastic radiation therapy: Secondary | ICD-10-CM | POA: Diagnosis not present

## 2016-06-17 NOTE — Progress Notes (Signed)
Patient left without being seen, was told she had another appt, and was running late 3:22 PM

## 2016-06-18 ENCOUNTER — Ambulatory Visit
Admission: RE | Admit: 2016-06-18 | Discharge: 2016-06-18 | Disposition: A | Discharge status: Home / Self Care | Payer: BLUE CROSS/BLUE SHIELD | Source: Ambulatory Visit | Attending: Radiation Oncology | Admitting: Radiation Oncology

## 2016-06-18 DIAGNOSIS — Z510 Encounter for antineoplastic radiation therapy: Secondary | ICD-10-CM | POA: Diagnosis not present

## 2016-06-19 ENCOUNTER — Ambulatory Visit
Admission: RE | Admit: 2016-06-19 | Discharge: 2016-06-19 | Disposition: A | Discharge status: Home / Self Care | Payer: BLUE CROSS/BLUE SHIELD | Source: Ambulatory Visit | Attending: Radiation Oncology | Admitting: Radiation Oncology

## 2016-06-19 DIAGNOSIS — Z510 Encounter for antineoplastic radiation therapy: Secondary | ICD-10-CM | POA: Diagnosis not present

## 2016-06-20 ENCOUNTER — Other Ambulatory Visit: Payer: Self-pay | Admitting: Obstetrics & Gynecology

## 2016-06-22 ENCOUNTER — Ambulatory Visit
Admission: RE | Admit: 2016-06-22 | Discharge: 2016-06-22 | Disposition: A | Discharge status: Home / Self Care | Payer: BLUE CROSS/BLUE SHIELD | Source: Ambulatory Visit | Attending: Radiation Oncology | Admitting: Radiation Oncology

## 2016-06-22 DIAGNOSIS — Z510 Encounter for antineoplastic radiation therapy: Secondary | ICD-10-CM | POA: Diagnosis not present

## 2016-06-22 NOTE — Telephone Encounter (Signed)
Medication refill request: Sumatriptan Last AEX:  03/24/16 SM Next AEX: 07/01/17 SM Last MMG (if hormonal medication request): 12/16/15 [id number];  Refill authorized: 05/10/15 #9 Tablets 6R. Please advise. Thank you.

## 2016-06-23 ENCOUNTER — Ambulatory Visit
Admission: RE | Admit: 2016-06-23 | Discharge: 2016-06-23 | Disposition: A | Discharge status: Home / Self Care | Payer: BLUE CROSS/BLUE SHIELD | Source: Ambulatory Visit | Attending: Radiation Oncology | Admitting: Radiation Oncology

## 2016-06-23 DIAGNOSIS — Z510 Encounter for antineoplastic radiation therapy: Secondary | ICD-10-CM | POA: Diagnosis not present

## 2016-06-24 ENCOUNTER — Encounter: Payer: Self-pay | Admitting: Radiation Oncology

## 2016-06-24 ENCOUNTER — Inpatient Hospital Stay
Admission: RE | Admit: 2016-06-24 | Discharge: 2016-06-24 | Disposition: A | Discharge status: Home / Self Care | Payer: Self-pay | Source: Ambulatory Visit | Attending: Radiation Oncology | Admitting: Radiation Oncology

## 2016-06-24 ENCOUNTER — Ambulatory Visit
Admission: RE | Admit: 2016-06-24 | Discharge: 2016-06-24 | Disposition: A | Discharge status: Home / Self Care | Payer: BLUE CROSS/BLUE SHIELD | Source: Ambulatory Visit | Attending: Radiation Oncology | Admitting: Radiation Oncology

## 2016-06-24 VITALS — BP 109/68 | HR 77 | Temp 98.5°F | Resp 20 | Wt 141.2 lb

## 2016-06-24 DIAGNOSIS — Z510 Encounter for antineoplastic radiation therapy: Secondary | ICD-10-CM | POA: Diagnosis not present

## 2016-06-24 DIAGNOSIS — C50412 Malignant neoplasm of upper-outer quadrant of left female breast: Secondary | ICD-10-CM

## 2016-06-24 MED ORDER — SONAFINE EX EMUL
1.0000 "application " | Freq: Two times a day (BID) | CUTANEOUS | Status: DC
  Administered 2016-06-24: 1 via TOPICAL

## 2016-06-24 NOTE — Progress Notes (Signed)
sonafine given, patient to be seen this Friday as well per MD, get patient back on track  2:36 PM

## 2016-06-24 NOTE — Progress Notes (Signed)
Department of Radiation Oncology  Phone:  [phone] Fax:        [phone]  Weekly Treatment Note    Name: Tiffany Nash Date: 06/24/2016 MRN: [id number] DOB: 05-Jan-1961   Diagnosis:     ICD-9-CM ICD-10-CM   1. Breast cancer of upper-outer quadrant of left female breast ([facility]) 174.4 C50.412 SONAFINE emulsion 1 application     Current dose: 36 Gy  Current fraction: 20   MEDICATIONS: Current Outpatient Prescriptions  Medication Sig Dispense Refill  . Acetaminophen (TYLENOL PO) Take 1 tablet by mouth as needed. Reported on 04/15/2016    . famotidine (PEPCID) 20 MG tablet Take 1 tablet (20 mg total) by mouth as needed. 30 tablet 11  . hyaluronate sodium (RADIAPLEXRX) GEL Apply 1 application topically 2 (two) times daily.    Marland Kitchen ibuprofen (ADVIL,MOTRIN) 800 MG tablet Take 400 mg by mouth as needed for pain.     . NON FORMULARY at bedtime as needed. Herb Complex.    . non-metallic deodorant Jethro Poling) MISC Apply 1 application topically daily as needed.    . Nutritional Supplements (JUICE PLUS FIBRE PO) Take by mouth at bedtime as needed.     . SUMAtriptan (IMITREX) 100 MG tablet TAKE 1 TABLET BY MOUTH IF NEEDED FOR MIGRAINES MAY REPEAT IN 2 HOURS IF HEADACHE PERSISTS 9 tablet 6  . Wound Dressings (SONAFINE) Apply 1 application topically 2 (two) times daily.     Current Facility-Administered Medications  Medication Dose Route Frequency Provider Last Rate Last Dose  . SONAFINE emulsion 1 application  1 application Topical BID Hayden Pedro, [place]   1 application at [id number]     ALLERGIES: Penicillins and Tylox [oxycodone-acetaminophen]   LABORATORY DATA:  Lab Results  Component Value Date   WBC 5.9 04/15/2016   HGB 13.1 04/15/2016   HCT 39.9 04/15/2016   MCV 96.0 04/15/2016   PLT 298 04/15/2016   Lab Results  Component Value Date   NA 141 04/15/2016   K 4.2 04/15/2016   CL 104 03/24/2016   CO2 27 04/15/2016   Lab Results  Component Value Date   ALT  16 04/15/2016   AST 16 04/15/2016   ALKPHOS 68 04/15/2016   BILITOT 0.43 04/15/2016     NARRATIVE: Tiffany Nash was seen today for weekly treatment management. The chart was checked and the patient's films were reviewed.  weekly rad txs  Left breast 20/33 completed,  Mild erythema  ,skin intact,  Using radaiplex bid,  Slight c/o itching, appetite good, energy level  Mild fatigue from not sleeping well stated 3:30 PM  Wt Readings from Last 3 Encounters:  06/24/16 141 lb 3.2 oz (64 kg)  06/12/16 140 lb 14.4 oz (63.9 kg)  06/05/16 143 lb (64.9 kg)  BP 109/68 (BP Location: Right Arm, Patient Position: Sitting, Cuff Size: Normal)   Pulse 77   Temp 98.5 F (36.9 C) (Oral)   Resp 20   Wt 141 lb 3.2 oz (64 kg)   LMP 05/12/2012   BMI 23.50 kg/m     PHYSICAL EXAMINATION: weight is 141 lb 3.2 oz (64 kg). Her oral temperature is 98.5 F (36.9 C). Her blood pressure is 109/68 and her pulse is 77. Her respiration is 20.      Dermatitis present in the treatment area. No Desquamation.  ASSESSMENT: The patient is doing satisfactorily with treatment.  PLAN: We will continue with the patient's radiation treatment as planned. The patient was also given some sonafine cream  given the pruritus she is experiencing to be used if necessary.

## 2016-06-24 NOTE — Progress Notes (Signed)
weekly rad txs  Left breast 20/33 completed,  Mild erythema  ,skin intact,  Using radaiplex bid,  Slight c/o itching, appetite good, energy level  Mild fatigue from not sleeping well stated 2:18 PM  Wt Readings from Last 3 Encounters:  06/12/16 140 lb 14.4 oz (63.9 kg)  06/05/16 143 lb (64.9 kg)  05/29/16 136 lb (61.7 kg)  BP 109/68 (BP Location: Right Arm, Patient Position: Sitting, Cuff Size: Normal)   Pulse 77   Temp 98.5 F (36.9 C) (Oral)   Resp 20   Wt 141 lb 3.2 oz (64 kg)   LMP 05/12/2012   BMI 23.50 kg/m

## 2016-06-25 ENCOUNTER — Ambulatory Visit
Admission: RE | Admit: 2016-06-25 | Discharge: 2016-06-25 | Disposition: A | Discharge status: Home / Self Care | Payer: BLUE CROSS/BLUE SHIELD | Source: Ambulatory Visit | Attending: Radiation Oncology | Admitting: Radiation Oncology

## 2016-06-25 DIAGNOSIS — Z510 Encounter for antineoplastic radiation therapy: Secondary | ICD-10-CM | POA: Diagnosis not present

## 2016-06-26 ENCOUNTER — Ambulatory Visit
Admission: RE | Admit: 2016-06-26 | Discharge: 2016-06-26 | Disposition: A | Discharge status: Home / Self Care | Payer: BLUE CROSS/BLUE SHIELD | Source: Ambulatory Visit | Attending: Radiation Oncology | Admitting: Radiation Oncology

## 2016-06-26 ENCOUNTER — Encounter: Payer: Self-pay | Admitting: Radiation Oncology

## 2016-06-26 VITALS — BP 109/70 | HR 90 | Temp 98.5°F | Resp 16 | Ht 65.0 in | Wt 142.0 lb

## 2016-06-26 DIAGNOSIS — C50412 Malignant neoplasm of upper-outer quadrant of left female breast: Secondary | ICD-10-CM

## 2016-06-26 DIAGNOSIS — Z510 Encounter for antineoplastic radiation therapy: Secondary | ICD-10-CM | POA: Diagnosis not present

## 2016-06-26 NOTE — Progress Notes (Signed)
weekly rad txs  Left breast 22/33 completed,  Mild erythema  ,skin intact,  Using radaiplex bid,  Slight c/o itching added Hydrocortisone today, appetite good, energy level  Mild fatigue from not sleeping well stated. Wt Readings from Last 3 Encounters:  06/26/16 142 lb (64.4 kg)  06/24/16 141 lb 3.2 oz (64 kg)  06/12/16 140 lb 14.4 oz (63.9 kg)  BP 109/70 (BP Location: Right Arm, Patient Position: Sitting, Cuff Size: Normal)   Pulse 90   Temp 98.5 F (36.9 C) (Oral)   Resp 16   Ht 5\' 5"  (1.651 m)   Wt 142 lb (64.4 kg)   LMP 05/12/2012   SpO2 100%   BMI 23.63 kg/m

## 2016-06-26 NOTE — Progress Notes (Signed)
   Department of Radiation Oncology  Phone:  [phone] Fax:        [phone]  Weekly Treatment Note    Name: Tiffany Nash Date: 06/26/2016 MRN: [id number] DOB: 30-Jan-1961   Diagnosis:     ICD-9-CM ICD-10-CM   1. Breast cancer of upper-outer quadrant of left female breast ([facility]) 174.4 C50.412      Current dose: 39.6 Gy  Current fraction: 22   MEDICATIONS: Current Outpatient Prescriptions  Medication Sig Dispense Refill  . famotidine (PEPCID) 20 MG tablet Take 1 tablet (20 mg total) by mouth as needed. 30 tablet 11  . hyaluronate sodium (RADIAPLEXRX) GEL Apply 1 application topically 2 (two) times daily.    . NON FORMULARY at bedtime as needed. Herb Complex.    . non-metallic deodorant Jethro Poling) MISC Apply 1 application topically daily as needed.    . Nutritional Supplements (JUICE PLUS FIBRE PO) Take by mouth at bedtime as needed.     . SUMAtriptan (IMITREX) 100 MG tablet TAKE 1 TABLET BY MOUTH IF NEEDED FOR MIGRAINES MAY REPEAT IN 2 HOURS IF HEADACHE PERSISTS 9 tablet 6  . Wound Dressings (SONAFINE) Apply 1 application topically 2 (two) times daily.    . Acetaminophen (TYLENOL PO) Take 1 tablet by mouth as needed. Reported on 04/15/2016    . ibuprofen (ADVIL,MOTRIN) 800 MG tablet Take 400 mg by mouth as needed for pain.      No current facility-administered medications for this encounter.      ALLERGIES: Penicillins and Tylox [oxycodone-acetaminophen]   LABORATORY DATA:  Lab Results  Component Value Date   WBC 5.9 04/15/2016   HGB 13.1 04/15/2016   HCT 39.9 04/15/2016   MCV 96.0 04/15/2016   PLT 298 04/15/2016   Lab Results  Component Value Date   NA 141 04/15/2016   K 4.2 04/15/2016   CL 104 03/24/2016   CO2 27 04/15/2016   Lab Results  Component Value Date   ALT 16 04/15/2016   AST 16 04/15/2016   ALKPHOS 68 04/15/2016   BILITOT 0.43 04/15/2016     NARRATIVE: Tiffany Nash was seen today for weekly treatment management. The chart was checked  and the patient's films were reviewed.  weekly rad txs  Left breast 22/33 completed,  Mild erythema  ,skin intact,  Using radaiplex bid,  Slight c/o itching added Hydrocortisone today, appetite good, energy level  Mild fatigue from not sleeping well stated. Wt Readings from Last 3 Encounters:  06/26/16 142 lb (64.4 kg)  06/24/16 141 lb 3.2 oz (64 kg)  06/12/16 140 lb 14.4 oz (63.9 kg)  BP 109/70 (BP Location: Right Arm, Patient Position: Sitting, Cuff Size: Normal)   Pulse 90   Temp 98.5 F (36.9 C) (Oral)   Resp 16   Ht 5\' 5"  (1.651 m)   Wt 142 lb (64.4 kg)   LMP 05/12/2012   SpO2 100%   BMI 23.63 kg/m   PHYSICAL EXAMINATION: height is 5\' 5"  (1.651 m) and weight is 142 lb (64.4 kg). Her oral temperature is 98.5 F (36.9 C). Her blood pressure is 109/70 and her pulse is 90. Her respiration is 16 and oxygen saturation is 100%.        ASSESSMENT: The patient is doing satisfactorily with treatment.  PLAN: We will continue with the patient's radiation treatment as planned.

## 2016-06-29 ENCOUNTER — Ambulatory Visit: Payer: BLUE CROSS/BLUE SHIELD | Admitting: Radiation Oncology

## 2016-06-29 ENCOUNTER — Ambulatory Visit
Admission: RE | Admit: 2016-06-29 | Discharge: 2016-06-29 | Disposition: A | Discharge status: Home / Self Care | Payer: BLUE CROSS/BLUE SHIELD | Source: Ambulatory Visit | Attending: Radiation Oncology | Admitting: Radiation Oncology

## 2016-06-29 DIAGNOSIS — Z510 Encounter for antineoplastic radiation therapy: Secondary | ICD-10-CM | POA: Diagnosis not present

## 2016-06-30 ENCOUNTER — Ambulatory Visit
Admission: RE | Admit: 2016-06-30 | Discharge: 2016-06-30 | Disposition: A | Discharge status: Home / Self Care | Payer: BLUE CROSS/BLUE SHIELD | Source: Ambulatory Visit | Attending: Radiation Oncology | Admitting: Radiation Oncology

## 2016-06-30 DIAGNOSIS — Z510 Encounter for antineoplastic radiation therapy: Secondary | ICD-10-CM | POA: Diagnosis not present

## 2016-07-01 ENCOUNTER — Ambulatory Visit
Admission: RE | Admit: 2016-07-01 | Discharge: 2016-07-01 | Disposition: A | Discharge status: Home / Self Care | Payer: BLUE CROSS/BLUE SHIELD | Source: Ambulatory Visit | Attending: Radiation Oncology | Admitting: Radiation Oncology

## 2016-07-01 DIAGNOSIS — Z510 Encounter for antineoplastic radiation therapy: Secondary | ICD-10-CM | POA: Diagnosis not present

## 2016-07-02 ENCOUNTER — Ambulatory Visit
Admission: RE | Admit: 2016-07-02 | Discharge: 2016-07-02 | Disposition: A | Discharge status: Home / Self Care | Payer: BLUE CROSS/BLUE SHIELD | Source: Ambulatory Visit | Attending: Radiation Oncology | Admitting: Radiation Oncology

## 2016-07-02 DIAGNOSIS — Z510 Encounter for antineoplastic radiation therapy: Secondary | ICD-10-CM | POA: Diagnosis not present

## 2016-07-03 ENCOUNTER — Ambulatory Visit
Admission: RE | Admit: 2016-07-03 | Discharge: 2016-07-03 | Disposition: A | Discharge status: Home / Self Care | Payer: BLUE CROSS/BLUE SHIELD | Source: Ambulatory Visit | Attending: Radiation Oncology | Admitting: Radiation Oncology

## 2016-07-03 DIAGNOSIS — C50412 Malignant neoplasm of upper-outer quadrant of left female breast: Secondary | ICD-10-CM

## 2016-07-03 DIAGNOSIS — Z510 Encounter for antineoplastic radiation therapy: Secondary | ICD-10-CM | POA: Diagnosis not present

## 2016-07-03 MED ORDER — SONAFINE EX EMUL
1.0000 "application " | Freq: Two times a day (BID) | CUTANEOUS | Status: DC
  Administered 2016-07-03: 1 via TOPICAL

## 2016-07-03 NOTE — Progress Notes (Signed)
MD saw patient in the back  tretament area, gave sonafone cream topatient per request, no assessment done by nursing 2:21 PM

## 2016-07-04 NOTE — Progress Notes (Signed)
   Department of Radiation Oncology  Phone:  [phone] Fax:        [phone]  Weekly Treatment Note    Name: Tiffany Nash Date: 07/04/2016 MRN: [id number] DOB: October 07, 1961   Diagnosis:     ICD-9-CM ICD-10-CM   1. Breast cancer of upper-outer quadrant of left female breast ([place]) 174.4 C50.412      Current dose: 48.6 Gy  Current fraction: 27   MEDICATIONS: Current Outpatient Prescriptions  Medication Sig Dispense Refill  . Acetaminophen (TYLENOL PO) Take 1 tablet by mouth as needed. Reported on 04/15/2016    . famotidine (PEPCID) 20 MG tablet Take 1 tablet (20 mg total) by mouth as needed. 30 tablet 11  . hyaluronate sodium (RADIAPLEXRX) GEL Apply 1 application topically 2 (two) times daily.    Marland Kitchen ibuprofen (ADVIL,MOTRIN) 800 MG tablet Take 400 mg by mouth as needed for pain.     . NON FORMULARY at bedtime as needed. Herb Complex.    . non-metallic deodorant Jethro Poling) MISC Apply 1 application topically daily as needed.    . Nutritional Supplements (JUICE PLUS FIBRE PO) Take by mouth at bedtime as needed.     . SUMAtriptan (IMITREX) 100 MG tablet TAKE 1 TABLET BY MOUTH IF NEEDED FOR MIGRAINES MAY REPEAT IN 2 HOURS IF HEADACHE PERSISTS 9 tablet 6  . Wound Dressings (SONAFINE) Apply 1 application topically 2 (two) times daily.     No current facility-administered medications for this encounter.      ALLERGIES: Penicillins and Tylox [oxycodone-acetaminophen]   LABORATORY DATA:  Lab Results  Component Value Date   WBC 5.9 04/15/2016   HGB 13.1 04/15/2016   HCT 39.9 04/15/2016   MCV 96.0 04/15/2016   PLT 298 04/15/2016   Lab Results  Component Value Date   NA 141 04/15/2016   K 4.2 04/15/2016   CL 104 03/24/2016   CO2 27 04/15/2016   Lab Results  Component Value Date   ALT 16 04/15/2016   AST 16 04/15/2016   ALKPHOS 68 04/15/2016   BILITOT 0.43 04/15/2016     NARRATIVE: Tiffany Nash was seen today for weekly treatment management. The chart was checked  and the patient's films were reviewed.  The patient is doing well. Her clinical setup was verified for her upcoming boost treatment. The patient continues to do well. Some increased skin irritation is present  PHYSICAL EXAMINATION: vitals were not taken for this visit.     Increased dermatitis secondary to radiation treatment. No moist desquamation. The boost site looks good.  ASSESSMENT: The patient is doing satisfactorily with treatment.  PLAN: We will continue with the patient's radiation treatment as planned.

## 2016-07-06 ENCOUNTER — Ambulatory Visit: Payer: BLUE CROSS/BLUE SHIELD

## 2016-07-06 ENCOUNTER — Ambulatory Visit
Admission: RE | Admit: 2016-07-06 | Discharge: 2016-07-06 | Disposition: A | Discharge status: Home / Self Care | Payer: BLUE CROSS/BLUE SHIELD | Source: Ambulatory Visit | Attending: Radiation Oncology | Admitting: Radiation Oncology

## 2016-07-06 DIAGNOSIS — Z510 Encounter for antineoplastic radiation therapy: Secondary | ICD-10-CM | POA: Diagnosis not present

## 2016-07-07 ENCOUNTER — Ambulatory Visit
Admission: RE | Admit: 2016-07-07 | Discharge: 2016-07-07 | Disposition: A | Discharge status: Home / Self Care | Payer: BLUE CROSS/BLUE SHIELD | Source: Ambulatory Visit | Attending: Radiation Oncology | Admitting: Radiation Oncology

## 2016-07-07 ENCOUNTER — Ambulatory Visit: Payer: BLUE CROSS/BLUE SHIELD

## 2016-07-07 DIAGNOSIS — Z510 Encounter for antineoplastic radiation therapy: Secondary | ICD-10-CM | POA: Diagnosis not present

## 2016-07-08 ENCOUNTER — Ambulatory Visit
Admission: RE | Admit: 2016-07-08 | Discharge: 2016-07-08 | Disposition: A | Discharge status: Home / Self Care | Payer: BLUE CROSS/BLUE SHIELD | Source: Ambulatory Visit | Attending: Radiation Oncology | Admitting: Radiation Oncology

## 2016-07-08 DIAGNOSIS — Z510 Encounter for antineoplastic radiation therapy: Secondary | ICD-10-CM | POA: Diagnosis not present

## 2016-07-09 ENCOUNTER — Ambulatory Visit
Admission: RE | Admit: 2016-07-09 | Discharge: 2016-07-09 | Disposition: A | Discharge status: Home / Self Care | Payer: BLUE CROSS/BLUE SHIELD | Source: Ambulatory Visit | Attending: Radiation Oncology | Admitting: Radiation Oncology

## 2016-07-09 DIAGNOSIS — Z510 Encounter for antineoplastic radiation therapy: Secondary | ICD-10-CM | POA: Diagnosis not present

## 2016-07-10 ENCOUNTER — Ambulatory Visit: Payer: BLUE CROSS/BLUE SHIELD

## 2016-07-10 ENCOUNTER — Ambulatory Visit
Admission: RE | Admit: 2016-07-10 | Discharge: 2016-07-10 | Disposition: A | Discharge status: Home / Self Care | Payer: BLUE CROSS/BLUE SHIELD | Source: Ambulatory Visit | Attending: Radiation Oncology | Admitting: Radiation Oncology

## 2016-07-10 ENCOUNTER — Encounter: Payer: Self-pay | Admitting: Radiation Oncology

## 2016-07-10 VITALS — BP 112/66 | HR 51 | Temp 98.2°F | Resp 16 | Wt 141.8 lb

## 2016-07-10 DIAGNOSIS — Z170 Estrogen receptor positive status [ER+]: Principal | ICD-10-CM

## 2016-07-10 DIAGNOSIS — Z510 Encounter for antineoplastic radiation therapy: Secondary | ICD-10-CM | POA: Diagnosis not present

## 2016-07-10 DIAGNOSIS — C50412 Malignant neoplasm of upper-outer quadrant of left female breast: Secondary | ICD-10-CM

## 2016-07-10 NOTE — Progress Notes (Signed)
Weekly rad txs left breast, 32/33dry desquamation under axilla, and erythema ,  Uses radioplex bid,  Appetite good, will follow up with Alean Rinne 09/01/16  2:28 PM BP 112/66 (BP Location: Right Arm, Patient Position: Sitting, Cuff Size: Normal)   Pulse (!) 51   Temp 98.2 F (36.8 C) (Oral)   Resp 16   Wt 141 lb 12.8 oz (64.3 kg)   LMP 05/12/2012   BMI 23.60 kg/m   Wt Readings from Last 3 Encounters:  07/10/16 141 lb 12.8 oz (64.3 kg)  06/26/16 142 lb (64.4 kg)  06/24/16 141 lb 3.2 oz (64 kg)

## 2016-07-12 NOTE — Progress Notes (Signed)
   Department of Radiation Oncology  Phone:  [phone] Fax:        [phone]  Weekly Treatment Note    Name: Tiffany Nash Date: 07/12/2016 MRN: [id number] DOB: 08-07-1961   Diagnosis:     ICD-9-CM ICD-10-CM   1. Malignant neoplasm of upper-outer quadrant of left breast in female, estrogen receptor positive ([place]) 174.4 C50.412    V86.0 Z17.0      Current dose: 58.4 Gy  Current fraction: 32   MEDICATIONS: Current Outpatient Prescriptions  Medication Sig Dispense Refill  . Acetaminophen (TYLENOL PO) Take 1 tablet by mouth as needed. Reported on 04/15/2016    . famotidine (PEPCID) 20 MG tablet Take 1 tablet (20 mg total) by mouth as needed. 30 tablet 11  . hyaluronate sodium (RADIAPLEXRX) GEL Apply 1 application topically 2 (two) times daily.    Marland Kitchen ibuprofen (ADVIL,MOTRIN) 800 MG tablet Take 400 mg by mouth as needed for pain.     . NON FORMULARY at bedtime as needed. Herb Complex.    . non-metallic deodorant Jethro Poling) MISC Apply 1 application topically daily as needed.    . Nutritional Supplements (JUICE PLUS FIBRE PO) Take by mouth at bedtime as needed.     . SUMAtriptan (IMITREX) 100 MG tablet TAKE 1 TABLET BY MOUTH IF NEEDED FOR MIGRAINES MAY REPEAT IN 2 HOURS IF HEADACHE PERSISTS 9 tablet 6  . Wound Dressings (SONAFINE) Apply 1 application topically 2 (two) times daily.     No current facility-administered medications for this encounter.      ALLERGIES: Penicillins and Tylox [oxycodone-acetaminophen]   LABORATORY DATA:  Lab Results  Component Value Date   WBC 5.9 04/15/2016   HGB 13.1 04/15/2016   HCT 39.9 04/15/2016   MCV 96.0 04/15/2016   PLT 298 04/15/2016   Lab Results  Component Value Date   NA 141 04/15/2016   K 4.2 04/15/2016   CL 104 03/24/2016   CO2 27 04/15/2016   Lab Results  Component Value Date   ALT 16 04/15/2016   AST 16 04/15/2016   ALKPHOS 68 04/15/2016   BILITOT 0.43 04/15/2016     NARRATIVE: Tiffany Nash was seen today for  weekly treatment management. The chart was checked and the patient's films were reviewed.  Weekly rad txs left breast, 32/33dry desquamation under axilla, and erythema ,  Uses radioplex bid,  Appetite good, will follow up with Tiffany Nash 09/01/16  9:39 PM BP 112/66 (BP Location: Right Arm, Patient Position: Sitting, Cuff Size: Normal)   Pulse (!) 51   Temp 98.2 F (36.8 C) (Oral)   Resp 16   Wt 141 lb 12.8 oz (64.3 kg)   LMP 05/12/2012   BMI 23.60 kg/m   Wt Readings from Last 3 Encounters:  07/10/16 141 lb 12.8 oz (64.3 kg)  06/26/16 142 lb (64.4 kg)  06/24/16 141 lb 3.2 oz (64 kg)    PHYSICAL EXAMINATION: weight is 141 lb 12.8 oz (64.3 kg). Her oral temperature is 98.2 F (36.8 C). Her blood pressure is 112/66 and her pulse is 51 (abnormal). Her respiration is 16.      The patient's skin shows moderate radiation effect without significant desquamation. Overall her skin held up quite well.  ASSESSMENT: The patient is doing satisfactorily with treatment.  PLAN: We will continue with the patient's radiation treatment as planned. The patient will return to clinic one month after completing her course of radiation treatment.

## 2016-07-12 NOTE — Progress Notes (Signed)
  Radiation Oncology         [phone] ________________________________  Name: [name] MRN: [id number]  Date: 06/24/2016  DOB: [birth year]  Complex simulation note  The patient has undergone complex simulation for her upcoming boost treatment for her diagnosis of left sided breast cancer. The patient has initially been planned to receive 50.4 Gy. The patient will now receive a 10 Gy boost to the seroma cavity which has been contoured. This will be accomplished using an en face electron field. Based on the depth of the target area, 12 MeV electrons will be used. The patient's final total dose therefore will be 60.4 Gy. A complex isodose plan from the electron [facility] Carlo calculation is requested for the boost treatment.   _______________________________  Jodelle Gross, MD, PhD

## 2016-07-13 ENCOUNTER — Ambulatory Visit
Admission: RE | Admit: 2016-07-13 | Discharge: 2016-07-13 | Disposition: A | Discharge status: Home / Self Care | Payer: BLUE CROSS/BLUE SHIELD | Source: Ambulatory Visit | Attending: Radiation Oncology | Admitting: Radiation Oncology

## 2016-07-13 ENCOUNTER — Encounter: Payer: Self-pay | Admitting: *Deleted

## 2016-07-13 DIAGNOSIS — Z170 Estrogen receptor positive status [ER+]: Principal | ICD-10-CM

## 2016-07-13 DIAGNOSIS — Z510 Encounter for antineoplastic radiation therapy: Secondary | ICD-10-CM | POA: Diagnosis not present

## 2016-07-13 DIAGNOSIS — C50412 Malignant neoplasm of upper-outer quadrant of left female breast: Secondary | ICD-10-CM

## 2016-07-14 ENCOUNTER — Ambulatory Visit: Payer: BLUE CROSS/BLUE SHIELD

## 2016-07-22 ENCOUNTER — Encounter: Payer: Self-pay | Admitting: Radiation Oncology

## 2016-07-22 ENCOUNTER — Telehealth: Payer: Self-pay | Admitting: Hematology

## 2016-07-22 NOTE — Progress Notes (Signed)
°  Radiation Oncology         [phone] ________________________________  Name: Tiffany Nash MRN: [id number]  Date: 07/22/2016  DOB: [birth year]  End of Treatment Note  Diagnosis:   Malignant neoplasm of upper-outer quadrant of left breast, estrogen receptor positive     Indication for treatment:  Curative       Radiation treatment dates:   05/26/16-07/13/16  Site/dose:  1)  Left Breast/ 50.4 Gy in 28 fx          2) Boost/ 10 Gy in 5 fx Beams/energy:   1) 3D / 6X         2) Isodose Plan/ 12 MeV  Narrative: The patient tolerated radiation treatment relatively well.   She had symptoms including desquamation under axilla, and erythema  Plan: The patient has completed radiation treatment. The patient will return to radiation oncology clinic for routine followup in one month. I advised them to call or return sooner if they have any questions or concerns related to their recovery or treatment.  ------------------------------------------------  Jodelle Gross, MD, PhD  This document serves as a record of services personally performed by Kyung Rudd, MD. It was created on his behalf by Bethann Humble, a trained medical scribe. The creation of this record is based on the scribe's personal observations and the provider's statements to them. This document has been checked and approved by the attending provider.

## 2016-07-22 NOTE — Telephone Encounter (Signed)
Spoke with patient regarding rescheduling her 10/12 appointment to 10/16.

## 2016-07-23 ENCOUNTER — Ambulatory Visit: Payer: BLUE CROSS/BLUE SHIELD | Admitting: Hematology

## 2016-07-27 ENCOUNTER — Encounter: Payer: Self-pay | Admitting: Hematology

## 2016-07-27 ENCOUNTER — Telehealth: Payer: Self-pay | Admitting: Hematology

## 2016-07-27 ENCOUNTER — Ambulatory Visit (HOSPITAL_BASED_OUTPATIENT_CLINIC_OR_DEPARTMENT_OTHER): Payer: BLUE CROSS/BLUE SHIELD | Admitting: Hematology

## 2016-07-27 VITALS — BP 109/64 | HR 73 | Temp 97.9°F | Resp 18 | Ht 65.0 in | Wt 144.8 lb

## 2016-07-27 DIAGNOSIS — C50412 Malignant neoplasm of upper-outer quadrant of left female breast: Secondary | ICD-10-CM

## 2016-07-27 DIAGNOSIS — E2839 Other primary ovarian failure: Secondary | ICD-10-CM

## 2016-07-27 DIAGNOSIS — Z170 Estrogen receptor positive status [ER+]: Secondary | ICD-10-CM

## 2016-07-27 MED ORDER — LETROZOLE 2.5 MG PO TABS: 2.5000 mg | ORAL_TABLET | Freq: Every day | ORAL | 2 refills | Status: DC

## 2016-07-27 NOTE — Telephone Encounter (Signed)
Avs report and appointment schedule given to patient, per 07/27/16 los. °

## 2016-07-27 NOTE — Progress Notes (Signed)
[place]  [phone] [phone]  Clinic Follow Up Note   Patient Care Team: Dibas Koirala, MD as PCP - General (Family Medicine) Excell Seltzer, MD as Consulting Physician (General Surgery) Truitt Merle, MD as Consulting Physician (Hematology) Kyung Rudd, MD as Consulting Physician (Radiation Oncology) 07/27/2016  CHIEF COMPLAINTS:  Follow up left breast cancer   Oncology History   Breast cancer of upper-outer quadrant of left female breast [facility])   Staging form: Breast, AJCC 7th Edition   - Clinical stage from 04/15/2016: Stage IA (T1b, N0, M0) - Unsigned         Staging comments: Staged at breast conference on 7.5.17   - Pathologic stage from 04/22/2016: Stage IA (T1c, N0, cM0) - Unsigned        Breast cancer of upper-outer quadrant of left female breast ([place])   04/02/2016 Mammogram    1 cm oval massin the upper outer quadrant of left breast, suspicious for malignancy.       04/08/2016 Initial Diagnosis    Breast cancer of upper-outer quadrant of left female breast ([place])      04/08/2016 Initial Biopsy    Left breast core needle biopsy showed invasive ductal carcinoma and DCIS, grade 1-2.      04/08/2016 Receptors her2    Your 100% positive, strong staining, PR 70% positive, strong staining, HER-2 negative, Ki-67 15%      04/22/2016 Surgery    Left breast lumpectomy and SLN biopsy       04/22/2016 Pathology Results    Left breast lumpectomy showed invasive ductal carcinoma, grade 1, 1.2 cm, low-grade DCIS, surgical margins were negative, 3 sentinel lymph nodes negative.      04/22/2016 Oncotype testing    RS 17, low risk, predicts 10 year distant recurrence risk of 11% with tamoxifen      05/26/2016 - 07/13/2016 Radiation Therapy    Adjuvant breast radiation       HISTORY OF PRESENTING ILLNESS:  Tiffany Nash 55 y.o. female is here because of Her newly diagnosed left breast cancer. She is accompanied by her husband to our  multidisciplinary breast clinic today.  She had routine screening mammogram in March and 17 which was negative. On her routine follow-up with her primary care physician a few weeks ago, breast examination reviewed a left breast mass. No skin change or nipple discharge. She feels well, no pain or other complains. She underwent diagnostic mammogram and ultrasound on 04/02/2016, which showed a 1 cm oval mass in the upper outer quadrant of left breast, core needle biopsy showed invasive ductal carcinoma and DCIS, ER/PR positive, HER-2 negative.   She is married, lives with her husband, who had head and neck cancer and was treated in our cancer center 5-6 years ago. She has no family history of breast cancer or other malignancy. 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   GYN HISTORY  Menarchal: 13 LMP: 2014 Contraceptive: 6 years  HRT: no G7P4: she did breast feeding for 6 weeks for each children, ago of 28-19, 2 daughters   CURRENT THERAPY: pending Letrozole   INTERIM HISTORY: Tiffany Nash returns for follow-up. She completed breast adjuvant radiation about 2 weeks ago. She tolerated well, she feels well overall, has mild tenderness in the left breast, no other complaints. She has good appetite and energy level, denies any arthralgia or other symptoms.   MEDICAL HISTORY:  Past Medical History:  Diagnosis Date  . Abnormal Pap smear   .  Breast cancer ([place])   . Elevated hemoglobin A1c June 2017   5.9  . GERD (gastroesophageal reflux disease)   . Migraine    uses imitrex as needed  . Schatzki's ring    and hiatal hernia on EGD  . Shingles rash 07/2015    shingles on right rib cage   . TMJ (temporomandibular joint syndrome)   . Vertigo     SURGICAL HISTORY: Past Surgical History:  Procedure Laterality Date  . BREAST BIOPSY  1/12   fibrocystic changes   . BREAST LUMPECTOMY WITH RADIOACTIVE SEED AND SENTINEL LYMPH NODE BIOPSY Left 04/22/2016   Procedure: BREAST LUMPECTOMY WITH  RADIOACTIVE SEED AND SENTINEL LYMPH NODE BIOPSY;  Surgeon: Excell Seltzer, MD;  Location: [place];  Service: General;  Laterality: Left;  . DILATION AND CURETTAGE OF UTERUS  1991  . ESOPHAGEAL DILATION    . GYNECOLOGIC CRYOSURGERY  1980s  . LAPAROSCOPIC CHOLECYSTECTOMY  1992  . WISDOM TOOTH EXTRACTION      SOCIAL HISTORY: Social History   Social History  . Marital status: Married    Spouse name: N/A  . Number of children: N/A  . Years of education: N/A   Occupational History  . Not on file.   Social History Main Topics  . Smoking status: Never Smoker  . Smokeless tobacco: Never Used  . Alcohol use 2.4 - 3.0 oz/week    2 - 3 Glasses of wine, 2 Standard drinks or equivalent per week     Comment: weekends only   . Drug use: No  . Sexual activity: Yes    Partners: Male   Other Topics Concern  . Not on file   Social History Narrative  . 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 Diabetes Father   . Goiter Mother   . Heart disease      ALLERGIES:  is allergic to penicillins and tylox [oxycodone-acetaminophen].  MEDICATIONS:  Current Outpatient Prescriptions  Medication Sig Dispense Refill  . Acetaminophen (TYLENOL PO) Take 1 tablet by mouth as needed. Reported on 04/15/2016    . famotidine (PEPCID) 20 MG tablet Take 1 tablet (20 mg total) by mouth as needed. 30 tablet 11  . ibuprofen (ADVIL,MOTRIN) 800 MG tablet Take 400 mg by mouth as needed for pain.     . NON FORMULARY at bedtime as needed. Herb Complex.    . non-metallic deodorant Jethro Poling) MISC Apply 1 application topically daily as needed.    . Nutritional Supplements (JUICE PLUS FIBRE PO) Take by mouth every morning.     . SUMAtriptan (IMITREX) 100 MG tablet TAKE 1 TABLET BY MOUTH IF NEEDED FOR MIGRAINES MAY REPEAT IN 2 HOURS IF HEADACHE PERSISTS 9 tablet 6   No current  facility-administered medications for this visit.     REVIEW OF SYSTEMS:   Constitutional: Denies fevers, chills or abnormal night sweats Eyes: Denies blurriness of vision, double vision or watery eyes Ears, nose, mouth, throat, and face: Denies mucositis or sore throat Respiratory: Denies cough, dyspnea or wheezes Cardiovascular: Denies palpitation, chest discomfort or lower extremity swelling Gastrointestinal:  Denies nausea, heartburn or change in bowel habits Skin: Denies abnormal skin rashes Lymphatics: Denies new lymphadenopathy or easy bruising Neurological:Denies numbness, tingling or new weaknesses Behavioral/Psych: Mood is stable, no new changes  All other systems were reviewed with the patient and are negative.  PHYSICAL EXAMINATION: ECOG PERFORMANCE STATUS: 0 - Asymptomatic  Vitals:   07/27/16 0849  BP: 109/64  Pulse: 73  Resp: 18  Temp: 97.9 F (36.6 C)   Filed Weights   07/27/16 0849  Weight: 144 lb 12.8 oz (65.7 kg)    GENERAL:alert, no distress and comfortable SKIN: skin color, texture, turgor are normal, no rashes or significant lesions EYES: normal, conjunctiva are pink and non-injected, sclera clear OROPHARYNX:no exudate, no erythema and lips, buccal mucosa, and tongue normal  NECK: supple, thyroid normal size, non-tender, without nodularity LYMPH:  no palpable lymphadenopathy in the cervical, axillary or inguinal LUNGS: clear to auscultation and percussion with normal breathing effort HEART: regular rate & rhythm and no murmurs and no lower extremity edema ABDOMEN:abdomen soft, non-tender and normal bowel sounds Musculoskeletal:no cyanosis of digits and no clubbing    PSYCH: alert & oriented x 3 with fluent speech NEURO: no focal motor/sensory deficits Breasts: Breast inspection showed them to be symmetrical with no nipple discharge. (+) Mild skin pigmentation of left breast, no ulcers or skin peeling. Palpitation of both breasts and both axillas  revealed no obvious mass that I could appreciate.   LABORATORY DATA:  I have reviewed the data as listed CBC Latest Ref Rng & Units 04/15/2016 03/24/2016 03/05/2015  WBC 3.9 - 10.3 10e3/uL 5.9 7.1 14.5(H)  Hemoglobin 11.6 - 15.9 g/dL 13.1 11.9 12.8  Hematocrit 34.8 - 46.6 % 39.9 35.5 37.7  Platelets 145 - 400 10e3/uL 298 315 340   CMP Latest Ref Rng & Units 04/15/2016 03/24/2016 03/05/2015  Glucose 70 - 140 mg/dl 81 103(H) 112(H)  BUN 7.0 - 26.0 mg/dL 13.4 15 19   Creatinine 0.6 - 1.1 mg/dL 0.8 0.80 0.86  Sodium 136 - 145 mEq/L 141 140 136  Potassium 3.5 - 5.1 mEq/L 4.2 4.2 4.6  Chloride 98 - 110 mmol/L - 104 101  CO2 22 - 29 mEq/L 27 27 27   Calcium 8.4 - 10.4 mg/dL 9.8 9.6 9.8  Total Protein 6.4 - 8.3 g/dL 7.4 6.7 7.1  Total Bilirubin 0.20 - 1.20 mg/dL 0.43 0.5 0.2  Alkaline Phos 40 - 150 U/L 68 60 77  AST 5 - 34 U/L 16 26 25   ALT 0 - 55 U/L 16 21 27    PATHOLOGY REPORT Diagnosis 04/08/2016 Breast, left, needle core biopsy - INVASIVE DUCTAL CARCINOMA. - DUCTAL CARCINOMA IN SITU. - SEE COMMENT. Microscopic Comment Although definitive grading of breast carcinoma is best done on excision, the features of the invasive tumor from the left breast needle core biopsy are compatible with a grade 1-2 breast carcinoma. Breast prognostic markers will be performed and reported in an addendum. Findings are called to [place] ([place]) on 04/09/2016. Dr. Vicente Males has seen this case in consultation with agreement. (RH:ecj 04/09/2016)   Results: IMMUNOHISTOCHEMICAL AND MORPHOMETRIC ANALYSIS PERFORMED MANUALLY Estrogen Receptor: 100%, POSITIVE, STRONG STAINING INTENSITY Progesterone Receptor: 70%, POSITIVE, STRONG STAINING INTENSITY Proliferation Marker Ki67: 15%  Results: HER2 - NEGATIVE RATIO OF HER2/CEP17 SIGNALS 1.06 AVERAGE HER2 COPY NUMBER PER CELL 1.85  Diagnosis 04/22/2016 1. Breast, lumpectomy, Left - INVASIVE DUCTAL CARCINOMA, GRADE I/III,  SPANNING 1.2 CM. - DUCTAL CARCINOMA IN SITU, LOW GRADE. - THE SURGICAL RESECTION MARGINS ARE NEGATIVE FOR CARCINOMA. - SEE ONCOLOGY TABLE BELOW. 2. Lymph node, sentinel, biopsy, Left Axillary #1 - THERE IS NO EVIDENCE OF CARCINOMA IN 1 OF 1 LYMPH NODE (0/1). 3. Lymph node, sentinel, biopsy, Left Axillary #2 - THERE IS NO EVIDENCE OF  CARCINOMA IN 1 OF 1 LYMPH NODE (0/1). 4. Lymph node, sentinel, biopsy, Left Axillary #3 - THERE IS NO EVIDENCE OF CARCINOMA IN 1 OF 1 LYMPH NODE (0/1). Microscopic Comment 1. BREAST, INVASIVE TUMOR, WITH LYMPH NODES PRESENT Specimen, including laterality and lymph node sampling (sentinel, non-sentinel): Left breast and left axillary lymph nodes. Procedure: Lumpectomy and left axillary lymph node resection x 3. Histologic type: Ductal. Grade: I. Tubule formation: 2. Nuclear pleomorphism: 2. Mitotic: 1. Tumor size (gross measurement): 1.2 cm. Margins: Greater than 0.2 cm to all margins. Lymphovascular invasion: Not identified. Ductal carcinoma in situ: Present. Grade: Low grade. Extensive intraductal component: Not identified. Lobular neoplasia: Not identified. Tumor focality: Unifocal. Treatment effect: N/A. 1 of 3 FINAL for [facility], [place] E ([id number]) Microscopic Comment(continued) Extent of tumor: Confined to breast parenchyma. Lymph nodes: Examined: 3 Sentinel 0 Non-sentinel 3 Total Lymph nodes with metastasis: 0. Breast prognostic profile: [phone]. Estrogen receptor: 100%, strong staining intensity. Progesterone receptor: 70%, strong staining intensity. Her 2 neu: No amplification was detected. The ratio was 1.06. Ki-67: 15%. Non-neoplastic breast: No significant findings. TNM: pT1c, pN0. (JBK:ds 04/27/16)  ONCOTYPE DX RS 17, predicts 10 year distant recurrence risk of 11% with tamoxifen   RADIOGRAPHIC STUDIES: I have personally reviewed the radiological images as listed and agreed with the findings in the report.  Diagnostic  mammogram and ultrasound of the left breast 04/02/2016 Impression: Suspicious of malignancy The 1 cm oval mass in the left breast is suspicious of malignancy, in the left upper outer quadrant posterior depth. Breast composition category C  ASSESSMENT & PLAN:  55 year old Caucasian post menopause female, with palpable left breast mass, which was not detected by screening mammogram 3 months ago.  1. Breast cancer of lower-outer quadrant of left female breast, invasive and in situ ductal carcinoma, grade 1, pT1cN0M0, stage IA, ER 100% positive, PR 70% positive, HER-2 negative -I reviewed her surgical pathology findings with her and her husband in details, she had early stage breast cancer, was resected completely. --I discussed the Oncotype Dx result was reviewed with her in details. She has low risk based on the recurrence score, which predicts 10 year distant recurrence after 5 years of tamoxifen 11%. She does not need adjuvant chemotherapy --Given the strong ER and PR positivity, I do recommend adjuvant aromatase inhibitor to reduce her risk of cancer recurrence,  The potential benefit and side effects, which includes but not limited to, hot flash, skin and vaginal dryness, metabolic changes ( increased blood glucose, cholesterol, weight, etc.), slightly in increased risk of cardiovascular disease, cataracts, muscular and joint discomfort, osteopenia and osteoporosis, etc, were discussed with her in great details. She is interested, she has recovered well from radiation, will start this week. -I sent a prescription of letrozole to her pharmacy today. Written material of letrozole was given to her. -We discussed breast cancer surveillance. She will continue annual screening mammogram, self exam, and see [place] regularly.  2. Bone Health -She has not had a bone density scan for many years, she is post menopause, I'll obtain 1 in the next month -We discussed the letrozole may waak her bone, will monitor  her bone density scan every 2 years   Plan -She will start letrozole this week -She is scheduled to see survivorship clinic in 2 months -I'll see her back in 3 months with lab  All questions were answered. The patient knows to call the clinic with any problems, questions or concerns. I spent 25 minutes counseling the patient face  to face. The total time spent in the appointment was 30 minutes and more than 50% was on counseling.     Truitt Merle, MD 07/27/2016 8:59 AM

## 2016-08-21 ENCOUNTER — Ambulatory Visit (INDEPENDENT_AMBULATORY_CARE_PROVIDER_SITE_OTHER): Payer: BLUE CROSS/BLUE SHIELD | Admitting: Certified Nurse Midwife

## 2016-08-21 ENCOUNTER — Telehealth: Payer: Self-pay | Admitting: *Deleted

## 2016-08-21 ENCOUNTER — Encounter: Payer: Self-pay | Admitting: Certified Nurse Midwife

## 2016-08-21 VITALS — BP 110/64 | HR 70 | Temp 97.3°F | Resp 16 | Ht 65.75 in | Wt 142.0 lb

## 2016-08-21 DIAGNOSIS — N390 Urinary tract infection, site not specified: Secondary | ICD-10-CM

## 2016-08-21 LAB — POCT URINALYSIS DIPSTICK
Bilirubin, UA: NEGATIVE
GLUCOSE UA: NEGATIVE
Ketones, UA: NEGATIVE
NITRITE UA: NEGATIVE
PH UA: 5
PROTEIN UA: NEGATIVE
RBC UA: NEGATIVE
UROBILINOGEN UA: NEGATIVE

## 2016-08-21 MED ORDER — NITROFURANTOIN MONOHYD MACRO 100 MG PO CAPS: 100.0000 mg | ORAL_CAPSULE | Freq: Two times a day (BID) | ORAL | 0 refills | Status: DC

## 2016-08-21 MED ORDER — PHENAZOPYRIDINE HCL 100 MG PO TABS: 100.0000 mg | ORAL_TABLET | Freq: Three times a day (TID) | ORAL | 0 refills | Status: DC | PRN

## 2016-08-21 NOTE — Progress Notes (Signed)
55 y.o. Married Caucasian female [phone] here with complaint of UTI, with onset  on 4 days ago.. Patient complaining of urinary frequency/urgency  and pain with urination. Patient denies fever, chills, nausea or back pain. Has noted odor to urine also. No new personal products. Patient feels not related to sexual activity. Denies any vaginal symptoms. . Menopausal with no vaginal dryness. Patient not consuming adequate water intake. Limited caffeine intake. Now on letrozole due to breast cancer. No hot flashes now. No other health issues.   O: Healthy female WDWN Affect: Normal, orientation x 3 Skin : warm and dry CVAT: negative bilateral Abdomen: positive for suprapubic tenderness  Pelvic exam: External genital area: normal, no lesions Bladder,Urethra tender, Urethral meatus: tender, red Vagina: normal vaginal discharge, normal appearance  Wet prep not taken, slight vaginal dryness at entrance to vagina only Cervix: normal, non tender Uterus:normal,non tender Adnexa: normal non tender, no fullness or masses   A: UTI Normal pelvic exam poct urine-wbc 2+ P: Reviewed findings of UTI and need for treatment. [id number] see order with instructions [id number]  culture Reviewed warning signs and symptoms of UTI and need to advise if occurring. Encouraged to limit soda, tea, and coffee and be sure to increase water intake.   RV prn

## 2016-08-21 NOTE — Patient Instructions (Signed)

## 2016-08-21 NOTE — Telephone Encounter (Signed)
Patient called and left message that she is having a bone density test on 11/16 and they need an order for it.  Reviewed her chart and she already has an order for the bone density in her chart.  Called patient and left message that bone density has been ordered.

## 2016-08-23 LAB — URINE CULTURE

## 2016-08-26 ENCOUNTER — Telehealth: Payer: Self-pay

## 2016-08-26 NOTE — Telephone Encounter (Signed)
patient returning your call °

## 2016-08-26 NOTE — Telephone Encounter (Signed)
Patient notified of results as written by provider 

## 2016-08-26 NOTE — Telephone Encounter (Signed)
-----   Message from Regina Eck, CNM sent at 08/26/2016  7:33 AM EST ----- Notify patient that urine culture is negative for urine bacteria, very low amount of just normal skin bacteria. Patient status?

## 2016-08-26 NOTE — Telephone Encounter (Signed)
lmtcb

## 2016-08-28 NOTE — Progress Notes (Signed)
Encounter reviewed Jill Jertson, MD   

## 2016-09-01 ENCOUNTER — Ambulatory Visit
Admission: RE | Admit: 2016-09-01 | Discharge: 2016-09-01 | Disposition: A | Discharge status: Home / Self Care | Payer: BLUE CROSS/BLUE SHIELD | Source: Ambulatory Visit | Attending: Radiation Oncology | Admitting: Radiation Oncology

## 2016-09-08 NOTE — Progress Notes (Signed)
Mrs. Tiffany Nash is here for a one month follow up appointment for left cancer.  Skin status:Left breast with tanning What lotion are you using? Using vitamin E oil Have you seen med onc? If not, when is appointment: 07-27-16 Dr. Burr Medico If they are ER+, have they started Al or Tamoxifen? If not, why? 07-27-16 Letrozole Discuss survivorship appointment. 09-24-16 Mike Craze, N.P. Have you had a mammogram scheduled? Not scheduled yet Offer referral to [facility]. Will receive at the survivorship appointment. Appetite:Good Pain:2/10 to left rib cage Fatigue:None Arm mobility:Able to move left arm without discomfort Wt Readings from Last 3 Encounters:  09/09/16 142 lb (64.4 kg)  08/21/16 142 lb (64.4 kg)  07/27/16 144 lb 12.8 oz (65.7 kg)  BP 112/63   Pulse 84   Temp 98.4 F (36.9 C) (Oral)   Resp 18   Ht 5' 5.75" (1.67 m)   Wt 142 lb (64.4 kg)   LMP 05/12/2012   SpO2 100%   BMI 23.09 kg/m

## 2016-09-09 ENCOUNTER — Encounter: Payer: Self-pay | Admitting: Radiation Oncology

## 2016-09-09 ENCOUNTER — Ambulatory Visit
Admission: RE | Admit: 2016-09-09 | Discharge: 2016-09-09 | Disposition: A | Discharge status: Home / Self Care | Payer: BLUE CROSS/BLUE SHIELD | Source: Ambulatory Visit | Attending: Radiation Oncology | Admitting: Radiation Oncology

## 2016-09-09 DIAGNOSIS — Z923 Personal history of irradiation: Secondary | ICD-10-CM | POA: Diagnosis not present

## 2016-09-09 DIAGNOSIS — Z880 Allergy status to penicillin: Secondary | ICD-10-CM | POA: Insufficient documentation

## 2016-09-09 DIAGNOSIS — R0789 Other chest pain: Secondary | ICD-10-CM | POA: Diagnosis not present

## 2016-09-09 DIAGNOSIS — C50412 Malignant neoplasm of upper-outer quadrant of left female breast: Secondary | ICD-10-CM

## 2016-09-09 DIAGNOSIS — Z170 Estrogen receptor positive status [ER+]: Secondary | ICD-10-CM | POA: Diagnosis present

## 2016-09-09 DIAGNOSIS — Z79899 Other long term (current) drug therapy: Secondary | ICD-10-CM | POA: Insufficient documentation

## 2016-09-09 DIAGNOSIS — C50912 Malignant neoplasm of unspecified site of left female breast: Secondary | ICD-10-CM | POA: Insufficient documentation

## 2016-09-09 NOTE — Progress Notes (Signed)
Radiation Oncology         [phone] ________________________________  Name: Tiffany Nash MRN: [id number]  Date: 09/09/2016  DOB: 23-Aug-1961  Post Treatment Note  CC: Lujean Amel, MD  Excell Seltzer, MD  Diagnosis:  Stage IA, T1b, N0, M0 ER/PR positive invasive ductal carcinoma of the left breast.  Interval Since Last Radiation:  8 weeks   05/26/16-07/13/16 1.  Left Breast/ 50.4 Gy in 28 fx 2.  Boost/ 10 Gy in 5 fx   Narrative:  The patient returns today for routine follow-up. During treatment she did very well with radiotherapy and did not have significant desquamation.                             On review of systems, the patient states overall she's doing well. 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 She reports vaginal dryness and oral dryness since her letrazole began. She denies chest pain, shortness of breath, fevers or chills. No other complaints are verbalized.  ALLERGIES:  is allergic to penicillins and tylox [oxycodone-acetaminophen].  Meds: Current Outpatient Prescriptions  Medication Sig Dispense Refill  . Ascorbic Acid (VITAMIN C PO) Take by mouth daily.    Marland Kitchen CALCIUM PO Take by mouth daily.    . Cholecalciferol (VITAMIN D PO) Take by mouth daily.    . famotidine (PEPCID) 20 MG tablet Take 1 tablet (20 mg total) by mouth as needed. 30 tablet 11  . ibuprofen (ADVIL,MOTRIN) 800 MG tablet Take 400 mg by mouth as needed for pain.     Marland Kitchen letrozole (FEMARA) 2.5 MG tablet Take 1 tablet (2.5 mg total) by mouth daily. 30 tablet 2  . NON FORMULARY at bedtime as needed. Herb Complex.    . Nutritional Supplements (JUICE PLUS FIBRE PO) Take by mouth every morning.     . SUMAtriptan (IMITREX) 100 MG tablet TAKE 1 TABLET BY MOUTH IF NEEDED FOR MIGRAINES MAY REPEAT IN 2 HOURS IF HEADACHE  PERSISTS 9 tablet 6  . Acetaminophen (TYLENOL PO) Take 1 tablet by mouth as needed. Reported on 04/15/2016     No current facility-administered medications for this encounter.     Physical Findings:  height is 5' 5.75" (1.67 m) and weight is 142 lb (64.4 kg). Her oral temperature is 98.4 F (36.9 C). Her blood pressure is 112/63 and her pulse is 84. Her respiration is 18 and oxygen saturation is 100%.  In general this is a well appearing caucasian female in no acute distress. She's alert and oriented x4 and appropriate throughout the examination. Cardiopulmonary assessment is negative for acute distress and she exhibits normal effort. The left breast was examined and reveals no evidence of desquamation. She has mild hyperpigmentation and no palpable breast or chest wall mass.   Lab Findings: Lab Results  Component Value Date   WBC 5.9 04/15/2016   HGB 13.1 04/15/2016   HCT 39.9 04/15/2016   MCV 96.0 04/15/2016   PLT 298 04/15/2016     Radiographic Findings: No results found.  Impression/Plan: 1. Stage IA, T1b, N0, M0 ER/PR positive invasive ductal carcinoma of the left breast. The patient has been doing well since completion of radiotherapy. We discussed that we would be happy to continue to follow her as needed, but she  will also continue to follow up with Dr. Burr Medico in medical oncology with her Letrazole. She was encouraged to consider coconut oil for vaginal dryness. She was counseled on skin care and was encouraged to use Vitamin E Oil or Vitamin E containing lotion for the next 6 months, as well as encouraged to consider sunscreen over the upper chest wall to avoid sunburn.  2. Survivorship. The patient will meet with survivorship clinic on 09/24/16 and we emphasized the importance of this visit. 3. Left chest wall discomfort. This is possibly due to activity and a costocondritis. We discussed the use of OTC NSAIDs and the use of PPIs to protect the stomach. We will follow this  expectantly, and she will keep [place] informed of any progressive symptoms.    Carola Rhine, PAC

## 2016-09-22 ENCOUNTER — Telehealth: Payer: Self-pay | Admitting: Hematology

## 2016-09-22 ENCOUNTER — Telehealth: Payer: Self-pay

## 2016-09-22 NOTE — Telephone Encounter (Signed)
I called the patient back, and reviewed her symptoms. She is scheduled to see her primary care physician's PA tomorrow to discuss her stomach issue. We'll review that her arthralgia are most likely related to letrozole, I encouraged her exercise more, in try Tylenol as needed for pain. If she still has significant issues with arthralgia, I may consider switching her to Aromasin or tamoxifen. I suggest her to call me back in 2-3 weeks, and make a decision if we need to stop her letrozole.   Tiffany Nash  09/22/2016

## 2016-09-22 NOTE — Telephone Encounter (Signed)
Pt called back and LVM to clarify: her stomach is discomfort and soreness and cramping. She has spasms in her esophagus but had those prior to letrozole so is not sure if medication is affecting this. Her hands are cramping and stiff. Her achilles, her knees going up stairs, and her neck is worse. This has all been over the last 2 weeks.

## 2016-09-22 NOTE — Telephone Encounter (Signed)
Pt called stating she is having significant discomfort in her joints and hands, and her stomach is "messed up". She believes this is from her letrozole.  Called back and LVM for her to clarify what she means and how long this has been going on.

## 2016-09-23 ENCOUNTER — Other Ambulatory Visit (HOSPITAL_COMMUNITY): Payer: Self-pay | Admitting: Physician Assistant

## 2016-09-23 ENCOUNTER — Other Ambulatory Visit: Payer: Self-pay | Admitting: Physician Assistant

## 2016-09-23 DIAGNOSIS — Z853 Personal history of malignant neoplasm of breast: Secondary | ICD-10-CM

## 2016-09-23 DIAGNOSIS — R1084 Generalized abdominal pain: Secondary | ICD-10-CM

## 2016-09-23 DIAGNOSIS — R1310 Dysphagia, unspecified: Secondary | ICD-10-CM

## 2016-09-23 DIAGNOSIS — K219 Gastro-esophageal reflux disease without esophagitis: Secondary | ICD-10-CM

## 2016-09-24 ENCOUNTER — Encounter: Payer: BLUE CROSS/BLUE SHIELD | Admitting: Adult Health

## 2016-09-28 ENCOUNTER — Ambulatory Visit (HOSPITAL_COMMUNITY): Payer: BLUE CROSS/BLUE SHIELD

## 2016-09-29 ENCOUNTER — Ambulatory Visit
Admission: RE | Admit: 2016-09-29 | Discharge: 2016-09-29 | Disposition: A | Discharge status: Home / Self Care | Payer: BLUE CROSS/BLUE SHIELD | Source: Ambulatory Visit | Attending: Physician Assistant | Admitting: Physician Assistant

## 2016-09-29 DIAGNOSIS — R1084 Generalized abdominal pain: Secondary | ICD-10-CM

## 2016-09-29 DIAGNOSIS — Z853 Personal history of malignant neoplasm of breast: Secondary | ICD-10-CM

## 2016-09-29 MED ORDER — IOPAMIDOL (ISOVUE-300) INJECTION 61%: 100.0000 mL | Freq: Once | INTRAVENOUS | Status: DC | PRN

## 2016-10-07 ENCOUNTER — Ambulatory Visit (HOSPITAL_COMMUNITY)
Admission: RE | Admit: 2016-10-07 | Discharge: 2016-10-07 | Disposition: A | Discharge status: Home / Self Care | Payer: BLUE CROSS/BLUE SHIELD | Source: Ambulatory Visit | Attending: Physician Assistant | Admitting: Physician Assistant

## 2016-10-07 DIAGNOSIS — K449 Diaphragmatic hernia without obstruction or gangrene: Secondary | ICD-10-CM | POA: Insufficient documentation

## 2016-10-07 DIAGNOSIS — R1310 Dysphagia, unspecified: Secondary | ICD-10-CM | POA: Diagnosis present

## 2016-10-07 DIAGNOSIS — K219 Gastro-esophageal reflux disease without esophagitis: Secondary | ICD-10-CM | POA: Insufficient documentation

## 2016-10-08 ENCOUNTER — Telehealth: Payer: Self-pay | Admitting: Obstetrics & Gynecology

## 2016-10-08 NOTE — Telephone Encounter (Signed)
Jessica from [facility] called about this patient's CT scan done on 09/29/16 and ordered by Dr. Donna Christen.  Dr. Derek Mound would like Dr. Sabra Heck to review the results for follow up.  Verline Lema is aware of the call and will follow up with Dr. Sabra Heck.

## 2016-10-08 NOTE — Telephone Encounter (Signed)
Reviewed CT abdomen pelvis with contrast with Dr.Miller. Spoke with Janett Billow at [facility]. Advised findings on CT show pelvic congestion with unremarkable uterus or ovaries. Advised per Dr.Miller nothing further needs to be done at this time. Only option for treatment of findings is a hysterectomy which is not an indicated need at this time. Janett Billow will contact the patient to discuss the results and return call to the office if the patient would like to review results with Dr.Miller.

## 2016-10-08 NOTE — Telephone Encounter (Signed)
Called pt and left detailed message on voice mail per DPR.  Advised pt to call back with any additional questions.  Ok to close encounter.

## 2016-10-08 NOTE — Telephone Encounter (Signed)
Spoke with Janett Billow from [facility] who is asking if Dr.Miller can contact the patient to go over results with her. States patient has a few questions and stated she would feel comfortable reviewing these with Dr.Miller. Bennie Pierini I will speak with Dr.Miller and return call to the patient will be given. Janett Billow is agreeable.

## 2016-10-16 ENCOUNTER — Ambulatory Visit: Payer: BLUE CROSS/BLUE SHIELD

## 2016-10-16 ENCOUNTER — Other Ambulatory Visit: Payer: Self-pay | Admitting: *Deleted

## 2016-10-16 ENCOUNTER — Ambulatory Visit (HOSPITAL_BASED_OUTPATIENT_CLINIC_OR_DEPARTMENT_OTHER): Payer: BLUE CROSS/BLUE SHIELD | Admitting: Adult Health

## 2016-10-16 VITALS — BP 117/68 | HR 77 | Temp 98.0°F | Resp 18 | Ht 65.75 in | Wt 141.4 lb

## 2016-10-16 DIAGNOSIS — Z170 Estrogen receptor positive status [ER+]: Secondary | ICD-10-CM | POA: Diagnosis not present

## 2016-10-16 DIAGNOSIS — R300 Dysuria: Secondary | ICD-10-CM

## 2016-10-16 DIAGNOSIS — C50412 Malignant neoplasm of upper-outer quadrant of left female breast: Secondary | ICD-10-CM

## 2016-10-16 DIAGNOSIS — N390 Urinary tract infection, site not specified: Secondary | ICD-10-CM | POA: Diagnosis not present

## 2016-10-16 DIAGNOSIS — Z79811 Long term (current) use of aromatase inhibitors: Secondary | ICD-10-CM

## 2016-10-16 LAB — URINALYSIS, MICROSCOPIC - CHCC
BLOOD: NEGATIVE
Bilirubin (Urine): NEGATIVE
GLUCOSE UR CHCC: NEGATIVE mg/dL
KETONES: NEGATIVE mg/dL
Nitrite: NEGATIVE
PROTEIN: NEGATIVE mg/dL
SPECIFIC GRAVITY, URINE: 1.005 (ref 1.003–1.035)
Urobilinogen, UR: 0.2 mg/dL (ref 0.2–1)
pH: 7.5 (ref 4.6–8.0)

## 2016-10-16 MED ORDER — SULFAMETHOXAZOLE-TRIMETHOPRIM 800-160 MG PO TABS: 1.0000 | ORAL_TABLET | Freq: Two times a day (BID) | ORAL | 0 refills | Status: DC

## 2016-10-16 NOTE — Progress Notes (Signed)
[facility]:  Survivorship   REASON FOR VISIT:  Routine follow-up post-treatment for a recent history of breast cancer.  BRIEF ONCOLOGIC HISTORY:  Oncology History   Breast cancer of upper-outer quadrant of left female breast [place])   Staging form: Breast, AJCC 7th Edition   - Clinical stage from 04/15/2016: Stage IA (T1b, N0, M0) - Unsigned         Staging comments: Staged at breast conference on 7.5.17   - Pathologic stage from 04/22/2016: Stage IA (T1c, N0, cM0) - Unsigned        Breast cancer of upper-outer quadrant of left female breast ([place])   04/02/2016 Mammogram    1 cm oval massin Tiffany upper outer quadrant of left breast, suspicious for malignancy.       04/08/2016 Initial Diagnosis    Breast cancer of upper-outer quadrant of left female breast ([place])      04/08/2016 Initial Biopsy    Left breast core needle biopsy showed invasive ductal carcinoma and DCIS, grade 1-2.      04/08/2016 Receptors her2    Your 100% positive, strong staining, PR 70% positive, strong staining, HER-2 negative, Ki-67 15%      04/22/2016 [place]    Left breast lumpectomy and SLN biopsy ([place])      04/22/2016 Pathology Results    Left breast lumpectomy showed invasive ductal carcinoma, grade 1, 1.2 cm, low-grade DCIS, surgical margins were negative, 3 sentinel lymph nodes negative.      04/22/2016 Oncotype testing    RS 17, low risk, predicts 10 year distant recurrence risk of 11% with tamoxifen      05/26/2016 - 07/13/2016 Radiation Therapy    Adjuvant breast radiation [place]): Left Breast/ 50.4 Gy in 28 fx.  Boost/ 10 Gy in 5 fx      07/2016 -  Anti-estrogen oral therapy    Letrozole 2.5 mg daily. Planned duration of therapy: 5-10 years.       08/27/2016 Imaging    DEXA scan: Osteopenia. (T-score -2.4).        INTERVAL HISTORY:  [facility] presents to [facility] today for our initial meeting to review her survivorship care plan detailing her treatment course for  breast cancer, as well as monitoring long-term side effects of that treatment, education regarding health maintenance, screening, and overall wellness and health promotion.     Overall, [name] reports feeling quite well since completing her radiation therapy approximately 3 months ago.  She started on letrozole in 07/2016 and feels as though she is tolerating it relatively well. She endorses some arthralgias; exercise does help improve her pain. She has vaginal dryness; she tells me that she has had dysuria and urinary frequency for Tiffany past 2 days, which started after intercourse. She is wondering if she may have a UTI.  She does take calcium and vitamin D supplements. She remains active and exercises regularly. Her energy levels have been good. She is looking forward to a trip to [place] this weekend with her husband.    REVIEW OF SYSTEMS:  Review of Systems  Constitutional: Negative.   HENT:  Negative.   Eyes: Negative.   Respiratory: Negative.   Cardiovascular: Negative.   Gastrointestinal:       Reports heartburn; tells me she had a negative GI workup; now takes Prilosec twice a day.  Endocrine: Negative.   Genitourinary: Positive for dysuria and frequency.        Vaginal dryness  Musculoskeletal: Positive for arthralgias.  Skin:  Negative.   Neurological: Negative.   Hematological:       She has a right arm bruise that is healing from a lab draw a couple of weeks ago.  Psychiatric/Behavioral: Negative.   Breast: Denies any new nodularity, masses, tenderness, nipple changes, or nipple discharge.    A 14-point review of systems was completed and was negative, except as noted above.   ONCOLOGY TREATMENT TEAM:  1. Surgeon:  Dr. Saddie Benders at [facility] 2. Medical Oncologist: Dr. Burr Medico  3. Radiation Oncologist: Dr. Lisbeth Renshaw    PAST MEDICAL/SURGICAL HISTORY:  Past Medical History:  Diagnosis Date  . Abnormal Pap smear   . Breast cancer ([place])   . Elevated  hemoglobin A1c June 2017   5.9  . GERD (gastroesophageal reflux disease)   . Migraine    uses imitrex as needed  . Schatzki's ring    and hiatal hernia on EGD  . Shingles rash 07/2015    shingles on right rib cage   . TMJ (temporomandibular joint syndrome)   . Vertigo    Past Surgical History:  Procedure Laterality Date  . BREAST BIOPSY  1/12   fibrocystic changes   . BREAST LUMPECTOMY WITH RADIOACTIVE SEED AND SENTINEL LYMPH NODE BIOPSY Left 04/22/2016   Procedure: BREAST LUMPECTOMY WITH RADIOACTIVE SEED AND SENTINEL LYMPH NODE BIOPSY;  Surgeon: Excell Seltzer, MD;  Location: [facility];  Service: General;  Laterality: Left;  . DILATION AND CURETTAGE OF UTERUS  1991  . ESOPHAGEAL DILATION    . GYNECOLOGIC CRYOSURGERY  1980s  . LAPAROSCOPIC CHOLECYSTECTOMY  1992  . WISDOM TOOTH EXTRACTION       ALLERGIES:  Allergies  Allergen Reactions  . Penicillins     Yeast infection  . Tylox [Oxycodone-Acetaminophen]     tachycardia     CURRENT MEDICATIONS:  Outpatient Encounter Prescriptions as of 10/16/2016  Medication Sig  . CALCIUM PO Take by mouth daily.  . Cholecalciferol (VITAMIN D PO) Take 2,000 Units by mouth daily.   Marland Kitchen letrozole (FEMARA) 2.5 MG tablet Take 1 tablet (2.5 mg total) by mouth daily.  Marland Kitchen omeprazole (PRILOSEC) 20 MG capsule Take 20 mg by mouth 2 (two) times daily before a meal.  . polyethylene glycol (MIRALAX / GLYCOLAX) packet Take 17 g by mouth daily. Pt takes 1/2 dose daily.  . Acetaminophen (TYLENOL PO) Take 1 tablet by mouth as needed. Reported on 04/15/2016  . Ascorbic Acid (VITAMIN C PO) Take by mouth daily.  . famotidine (PEPCID) 20 MG tablet Take 1 tablet (20 mg total) by mouth as needed.  Marland Kitchen ibuprofen (ADVIL,MOTRIN) 800 MG tablet Take 400 mg by mouth as needed for pain.   . NON FORMULARY at bedtime as needed. Herb Complex.  . Nutritional Supplements (JUICE PLUS FIBRE PO) Take by mouth every morning.   . SUMAtriptan (IMITREX) 100 MG tablet  TAKE 1 TABLET BY MOUTH IF NEEDED FOR MIGRAINES MAY REPEAT IN 2 HOURS IF HEADACHE PERSISTS (Patient not taking: Reported on 10/16/2016)   No facility-administered encounter medications on file as of 10/16/2016.      ONCOLOGIC FAMILY HISTORY:  Family History  Problem Relation Age of Onset  . Diabetes Father   . Goiter Mother   . Heart disease       GENETIC COUNSELING/TESTING: None.  SOCIAL HISTORY:  [place] is married and lives with her husband in [place], [place]. She has 4 children (2 sons & 2 daughters), ranging in ages from 58-29. She has 1 granddaughter,  who is 46 months old. She denies any current tobacco or illicit drug use.  She drinks alcohol occasionally.   PHYSICAL EXAMINATION:  Vital Signs:   Vitals:   10/16/16 1050  BP: 117/68  Pulse: 77  Resp: 18  Temp: 98 F (36.7 C)   Filed Weights   10/16/16 1050  Weight: 141 lb 6.4 oz (64.1 kg)   General: Well-nourished, well-appearing female in no acute distress.  She is unaccompanied today.   HEENT: Head is normocephalic.  Pupils equal and reactive to light. Conjunctivae clear without exudate.  Sclerae anicteric. Oral mucosa is pink, moist.  Oropharynx is pink without lesions or erythema.  Lymph: No cervical, supraclavicular, or infraclavicular lymphadenopathy noted on palpation.  Cardiovascular: Regular rate and rhythm. Respiratory: Clear to auscultation bilaterally. Chest expansion symmetric; breathing non-labored.  GI: Abdomen soft and round; non-tender, non-distended. Bowel sounds normoactive.  GU: Deferred.  Neuro: No focal deficits. Steady gait.  Psych: Mood and affect normal and appropriate for situation.  Extremities: No edema. Skin: Warm and dry.  LABORATORY DATA:  Urinalysis: 10/16/16    DIAGNOSTIC IMAGING:  None for this visit.      ASSESSMENT AND PLAN:  Ms.. Mario is a pleasant 56 y.o. female with Stage IA left breast invasive ductal carcinoma, ER+/PR+/HER2-, diagnosed in 03/2016; treated  with lumpectomy, adjuvant radiation therapy, and anti-estrogen therapy with letrozole beginning in 07/2016.  She presents to [facility] for our initial meeting and routine follow-up post-completion of treatment for breast cancer.    1. Stage IA left breast cancer:  Ms. Antenucci is continuing to recover from definitive treatment for breast cancer. She will follow-up with her medical oncologist, Dr. Mosetta Putt on 10/28/16 with history and physical exam per surveillance protocol.  She will continue her anti-estrogen therapy with letrozole. Thus far, she is tolerating Tiffany medication well, with her biggest side effect being arthralgias; she manages Tiffany arthralgias with exercise. She understands that given she has been on Tiffany medication for about 3 months, that likely her side effects and symptoms of Tiffany medication will not worsen from this point forward. Today, a comprehensive survivorship care plan and treatment summary was reviewed with T[place] patient today detailing her breast cancer diagnosis, treatment course, potential late/long-term effects of treatment, appropriate follow-up care with recommendations for Tiffany future, and patient education resources.  A copy of this summary, along with a letter will be sent to Tiffany patient's primary care provider via mail/fax/In Basket message after today's visit.    2. Dysuria/Urinary tract infection:  She is concerned about her urinary symptoms since she is going out of town this weekend.  Based on her UA results with trace leukocyte esterase & WBC 7-10, I will empirically treat her with [place] 1 tab BID x 5 days.  Urine culture is pending and if antimicrobial therapy needs to be changed based on susceptibility results, I will change antibiotics if appropriate at that time.   *Addendum:  -Urine culture came back negative. I asked my nurse to call patient to give Tiffany results of Tiffany urine culture and encouraged her to still complete Tiffany course of antibiotics given her  symptoms and results of UA.   3. At risk for lymphedema: Ms. Dondlinger is s/p axillary lymph node biopsy, which puts her at increased risk of left arm lymphedema. She is planning on traveling quite a bit this year via airplane, which may also increase her risk of lymphedema. We discussed that prevention is best in terms of lymphedema management.  I recommended she get fitted for a lymphedema sleeve that she should wear (at a minimum) when flying or with vigorous exercise.  A paper prescription for compression sleeve was provided to T[facility] patient today and I recommended she go to "[facility] [employer] in [place] for fitting/ordering.  Encouraged her to call with questions/issues.   4. Vaginal dryness: Vaginal dryness is an extremely common problem among women after menopause as well as on antiestrogen therapy. We discussed Tiffany use of coconut oil to help with her vaginal dryness, she could use Tiffany coconut oil as both a vaginal moisturizer as well as with intercourse. I gave her instructions for use and encouraged her to give this a try. Of course, she could use more traditional vaginal moisturizers OTC like Replens, with a separate lubricant like Astroglide. Encouraged her to call with any questions or concerns.  5. Bone health:  Given Ms. Moes's age, history of breast cancer, and her current treatment regimen including anti-estrogen therapy with letrozole, she is at risk for bone demineralization.  Her last DEXA scan was 08/27/16 and showed osteopenia with a T-score of -2.4; we reviewed that this is considered borderline osteoporosis and she should continue her calcium and D3 supplements. Also, she was encouraged to increase her consumption of foods rich in calcium, as well as increase her weight-bearing activities.  She was given education on specific activities to promote bone health. She will be due for repeat bone density imaging as clinically indicated by Dr. Burr Medico, which will likely be in about 2  years.  6. Cancer screening:  Due to Ms. Witham's history and her age, she should receive screening for skin cancers, colon cancer, and gynecologic cancers.  Tiffany information and recommendations are listed on Tiffany patient's comprehensive care plan/treatment summary and were reviewed in detail with Tiffany patient.    7. Health maintenance and wellness promotion: Ms. Vannest was encouraged to consume 5-7 servings of fruits and vegetables per day. We reviewed Tiffany "Nutrition Rainbow" handout, as well as Tiffany handout "Take Control of Your Health and Reduce Your Cancer Risk" from [place] [place].  She was also encouraged to engage in moderate to vigorous exercise for 30 minutes per day most days of Tiffany week. We discussed [place] [facility] fitness program, which is designed for cancer survivors to help them become more physically fit after cancer treatments.  She was instructed to limit her alcohol consumption and continue to abstain from tobacco use.     8. Support services/counseling: It is not uncommon for this period of Tiffany patient's cancer care trajectory to be one of many emotions and stressors.  We discussed an opportunity for her to participate in Tiffany next session of [facility] [place] ("Finding Your New Normal") support group series designed for patients after they have completed treatment.   Ms. Sturdivant was encouraged to take advantage of our many other support services programs, support groups, and/or counseling in coping with her new life as a cancer survivor after completing anti-cancer treatment.  She was offered support today through active listening and expressive supportive counseling.  She was given information regarding our available services and encouraged to contact me with any questions or for help enrolling in any of our support group/programs.    Dispo:   -Return to cancer center to see Dr. Burr Medico on 10/28/16.  -She is welcome to return back to [facility] at any time; no  additional follow-up needed at this time.  -Consider referral back to survivorship as  a long-term survivor for continued surveillance   A total of 60 minutes of face-to-face time was spent with this patient with greater than 50% of that time in counseling and care-coordination.   Mike Craze, NP Survivorship Program [place] [phone]   Note: PRIMARY CARE PROVIDER Lujean Amel, [place] [phone]

## 2016-10-17 LAB — URINE CULTURE

## 2016-10-19 ENCOUNTER — Telehealth: Payer: Self-pay | Admitting: *Deleted

## 2016-10-19 NOTE — Telephone Encounter (Signed)
Pt returned call, culture result and instructions given. She voiced understanding.

## 2016-10-19 NOTE — Telephone Encounter (Signed)
Left message for pt to call office.  Urine culture reviewed by Mike Craze, NP: Negative. Finish antibiotic- likely early UTI.

## 2016-10-20 ENCOUNTER — Telehealth: Payer: Self-pay | Admitting: Hematology

## 2016-10-20 ENCOUNTER — Encounter: Payer: Self-pay | Admitting: Adult Health

## 2016-10-20 NOTE — Telephone Encounter (Signed)
spoke to patient to advise that appoinmtent has been changed from 10/28/16 to 10/26/16 at 1:00 pm

## 2016-10-26 ENCOUNTER — Ambulatory Visit (HOSPITAL_BASED_OUTPATIENT_CLINIC_OR_DEPARTMENT_OTHER): Payer: BLUE CROSS/BLUE SHIELD | Admitting: Hematology

## 2016-10-26 ENCOUNTER — Encounter: Payer: Self-pay | Admitting: Hematology

## 2016-10-26 ENCOUNTER — Other Ambulatory Visit (HOSPITAL_BASED_OUTPATIENT_CLINIC_OR_DEPARTMENT_OTHER): Payer: BLUE CROSS/BLUE SHIELD

## 2016-10-26 VITALS — BP 128/69 | HR 78 | Temp 98.2°F | Resp 16 | Ht 65.0 in | Wt 145.8 lb

## 2016-10-26 DIAGNOSIS — Z79811 Long term (current) use of aromatase inhibitors: Secondary | ICD-10-CM | POA: Diagnosis not present

## 2016-10-26 DIAGNOSIS — Z170 Estrogen receptor positive status [ER+]: Secondary | ICD-10-CM

## 2016-10-26 DIAGNOSIS — C50412 Malignant neoplasm of upper-outer quadrant of left female breast: Secondary | ICD-10-CM | POA: Diagnosis not present

## 2016-10-26 DIAGNOSIS — M859 Disorder of bone density and structure, unspecified: Secondary | ICD-10-CM

## 2016-10-26 LAB — CBC WITH DIFFERENTIAL/PLATELET
BASO%: 0.5 % (ref 0.0–2.0)
BASOS ABS: 0 10*3/uL (ref 0.0–0.1)
EOS ABS: 0.1 10*3/uL (ref 0.0–0.5)
EOS%: 1.6 % (ref 0.0–7.0)
HEMATOCRIT: 37.1 % (ref 34.8–46.6)
HEMOGLOBIN: 12.4 g/dL (ref 11.6–15.9)
LYMPH#: 1.4 10*3/uL (ref 0.9–3.3)
LYMPH%: 16.6 % (ref 14.0–49.7)
MCH: 31.9 pg (ref 25.1–34.0)
MCHC: 33.5 g/dL (ref 31.5–36.0)
MCV: 95.1 fL (ref 79.5–101.0)
MONO#: 0.5 10*3/uL (ref 0.1–0.9)
MONO%: 6 % (ref 0.0–14.0)
NEUT#: 6.3 10*3/uL (ref 1.5–6.5)
NEUT%: 75.3 % (ref 38.4–76.8)
PLATELETS: 311 10*3/uL (ref 145–400)
RBC: 3.9 10*6/uL (ref 3.70–5.45)
RDW: 13 % (ref 11.2–14.5)
WBC: 8.4 10*3/uL (ref 3.9–10.3)

## 2016-10-26 LAB — COMPREHENSIVE METABOLIC PANEL
ALBUMIN: 4.3 g/dL (ref 3.5–5.0)
ALK PHOS: 77 U/L (ref 40–150)
ALT: 42 U/L (ref 0–55)
ANION GAP: 10 meq/L (ref 3–11)
AST: 25 U/L (ref 5–34)
BUN: 12.5 mg/dL (ref 7.0–26.0)
CALCIUM: 9.9 mg/dL (ref 8.4–10.4)
CHLORIDE: 105 meq/L (ref 98–109)
CO2: 26 mEq/L (ref 22–29)
Creatinine: 0.7 mg/dL (ref 0.6–1.1)
EGFR: >90 mL/min/{1.73_m2} (ref >90)
Glucose: 115 mg/dl (ref 70–140)
Potassium: 4.1 mEq/L (ref 3.5–5.1)
Sodium: 142 mEq/L (ref 136–145)
Total Bilirubin: 0.3 mg/dL (ref 0.20–1.20)
Total Protein: 7.4 g/dL (ref 6.4–8.3)

## 2016-10-26 MED ORDER — LETROZOLE 2.5 MG PO TABS: 2.5000 mg | ORAL_TABLET | Freq: Every day | ORAL | 5 refills | Status: DC

## 2016-10-26 NOTE — Progress Notes (Signed)
[facility]  [phone] [phone]  Clinic Follow Up Note   Patient Care Team: Dibas Koirala, MD as PCP - General (Family Medicine) Excell Seltzer, MD as Consulting Physician (General Surgery) Truitt Merle, MD as Consulting Physician (Hematology) Kyung Rudd, MD as Consulting Physician (Radiation Oncology) 10/26/2016  CHIEF COMPLAINTS:  Follow up left breast cancer   Oncology History   Breast cancer of upper-outer quadrant of left female breast [facility])   Staging form: Breast, AJCC 7th Edition   - Clinical stage from 04/15/2016: Stage IA (T1b, N0, M0) - Unsigned         Staging comments: Staged at breast conference on 7.5.17   - Pathologic stage from 04/22/2016: Stage IA (T1c, N0, cM0) - Unsigned        Breast cancer of upper-outer quadrant of left female breast ([place])   04/02/2016 Mammogram    1 cm oval massin the upper outer quadrant of left breast, suspicious for malignancy.       04/08/2016 Initial Diagnosis    Breast cancer of upper-outer quadrant of left female breast ([place])      04/08/2016 Initial Biopsy    Left breast core needle biopsy showed invasive ductal carcinoma and DCIS, grade 1-2.      04/08/2016 Receptors her2    Your 100% positive, strong staining, PR 70% positive, strong staining, HER-2 negative, Ki-67 15%      04/22/2016 Surgery    Left breast lumpectomy and SLN biopsy ([place])      04/22/2016 Pathology Results    Left breast lumpectomy showed invasive ductal carcinoma, grade 1, 1.2 cm, low-grade DCIS, surgical margins were negative, 3 sentinel lymph nodes negative.      04/22/2016 Oncotype testing    RS 17, low risk, predicts 10 year distant recurrence risk of 11% with tamoxifen      05/26/2016 - 07/13/2016 Radiation Therapy    Adjuvant breast radiation [facility]): Left Breast/ 50.4 Gy in 28 fx.  Boost/ 10 Gy in 5 fx      07/2016 -  Anti-estrogen oral therapy    Letrozole 2.5 mg daily. Planned duration of therapy:  5-10 years.       08/27/2016 Imaging    DEXA scan: Osteopenia. (T-score -2.4).        HISTORY OF PRESENTING ILLNESS:  Tiffany Nash 56 y.o. female is here because of Her newly diagnosed left breast cancer. She is accompanied by her husband to our multidisciplinary breast clinic today.  She had routine screening mammogram in March and 17 which was negative. On her routine follow-up with her primary care physician a few weeks ago, breast examination reviewed a left breast mass. No skin change or nipple discharge. She feels well, no pain or other complains. She underwent diagnostic mammogram and ultrasound on 04/02/2016, which showed a 1 cm oval mass in the upper outer quadrant of left breast, core needle biopsy showed invasive ductal carcinoma and DCIS, ER/PR positive, HER-2 negative.   She is married, lives with her husband, who had head and neck cancer and was treated in our cancer center 5-6 years ago. She has no family history of breast cancer or other malignancy. 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   GYN HISTORY  Menarchal: 13 LMP: 2014 Contraceptive: 6 years  HRT: no G7P4: she did breast feeding for 6 weeks for each children, ago of 28-19, 2 daughters   CURRENT THERAPY:  Letrozole 2.5 mg daily started in 07/2016 INTERIM HISTORY: Tiffany Nash returns  for follow-up. She has been on letrozole for over 2 months as, and has been tolerating well overall. She denies significant hot flash, arthralgia except mild discomfort at right thrumb, no other noticeable side effects. She denies any pain or other new symptoms. Her appetite and energy level are normal, no other complaints.   MEDICAL HISTORY:  Past Medical History:  Diagnosis Date  . Abnormal Pap smear   . Breast cancer ([place])   . Elevated hemoglobin A1c June 2017   5.9  . GERD (gastroesophageal reflux disease)   . Migraine    uses imitrex as needed  . Schatzki's ring    and hiatal hernia on EGD  . Shingles rash 07/2015     shingles on right rib cage   . TMJ (temporomandibular joint syndrome)   . Vertigo     SURGICAL HISTORY: Past Surgical History:  Procedure Laterality Date  . BREAST BIOPSY  1/12   fibrocystic changes   . BREAST LUMPECTOMY WITH RADIOACTIVE SEED AND SENTINEL LYMPH NODE BIOPSY Left 04/22/2016   Procedure: BREAST LUMPECTOMY WITH RADIOACTIVE SEED AND SENTINEL LYMPH NODE BIOPSY;  Surgeon: Excell Seltzer, MD;  Location: [place];  Service: General;  Laterality: Left;  . DILATION AND CURETTAGE OF UTERUS  1991  . ESOPHAGEAL DILATION    . GYNECOLOGIC CRYOSURGERY  1980s  . LAPAROSCOPIC CHOLECYSTECTOMY  1992  . WISDOM TOOTH EXTRACTION      SOCIAL HISTORY: Social History   Social History  . Marital status: Married    Spouse name: N/A  . Number of children: N/A  . Years of education: N/A   Occupational History  . Not on file.   Social History Main Topics  . Smoking status: Never Smoker  . Smokeless tobacco: Never Used  . Alcohol use 1.2 oz/week    2 Standard drinks or equivalent per week     Comment: weekends only   . Drug use: No  . Sexual activity: Yes    Partners: Male     Comment: husband vasectomy   Other Topics Concern  . Not on file   Social History Narrative  . 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 Diabetes Father   . Goiter Mother   . Heart disease      ALLERGIES:  is allergic to penicillins and tylox [oxycodone-acetaminophen].  MEDICATIONS:  Current Outpatient Prescriptions  Medication Sig Dispense Refill  . Acetaminophen (TYLENOL PO) Take 1 tablet by mouth as needed. Reported on 04/15/2016    . CALCIUM PO Take by mouth daily.    . Cholecalciferol (VITAMIN D PO) Take 2,000 Units by mouth every other day.     . famotidine (PEPCID) 20 MG tablet Take 1 tablet (20 mg total) by mouth as needed. 30 tablet 11  . ibuprofen  (ADVIL,MOTRIN) 800 MG tablet Take 400 mg by mouth as needed for pain.     Marland Kitchen letrozole (FEMARA) 2.5 MG tablet Take 1 tablet (2.5 mg total) by mouth daily. 90 tablet 5  . Magnesium 100 MG CAPS Take 2 capsules by mouth daily.    Marland Kitchen omeprazole (PRILOSEC) 20 MG capsule Take 20 mg by mouth 2 (two) times daily before a meal.    . polyethylene glycol (MIRALAX / GLYCOLAX) packet Take 17 g by mouth daily. Pt takes 1/2 dose daily.    . SUMAtriptan (IMITREX) 100 MG tablet  TAKE 1 TABLET BY MOUTH IF NEEDED FOR MIGRAINES MAY REPEAT IN 2 HOURS IF HEADACHE PERSISTS (Patient not taking: Reported on 10/26/2016) 9 tablet 6   No current facility-administered medications for this visit.     REVIEW OF SYSTEMS:   Constitutional: Denies fevers, chills or abnormal night sweats Eyes: Denies blurriness of vision, double vision or watery eyes Ears, nose, mouth, throat, and face: Denies mucositis or sore throat Respiratory: Denies cough, dyspnea or wheezes Cardiovascular: Denies palpitation, chest discomfort or lower extremity swelling Gastrointestinal:  Denies nausea, heartburn or change in bowel habits Skin: Denies abnormal skin rashes Lymphatics: Denies new lymphadenopathy or easy bruising Neurological:Denies numbness, tingling or new weaknesses Behavioral/Psych: Mood is stable, no new changes  All other systems were reviewed with the patient and are negative.  PHYSICAL EXAMINATION: ECOG PERFORMANCE STATUS: 0 - Asymptomatic  Vitals:   10/26/16 1343  BP: 128/69  Pulse: 78  Resp: 16  Temp: 98.2 F (36.8 C)   Filed Weights   10/26/16 1343  Weight: 145 lb 12.8 oz (66.1 kg)    GENERAL:alert, no distress and comfortable SKIN: skin color, texture, turgor are normal, no rashes or significant lesions EYES: normal, conjunctiva are pink and non-injected, sclera clear OROPHARYNX:no exudate, no erythema and lips, buccal mucosa, and tongue normal  NECK: supple, thyroid normal size, non-tender, without  nodularity LYMPH:  no palpable lymphadenopathy in the cervical, axillary or inguinal LUNGS: clear to auscultation and percussion with normal breathing effort HEART: regular rate & rhythm and no murmurs and no lower extremity edema ABDOMEN:abdomen soft, non-tender and normal bowel sounds Musculoskeletal:no cyanosis of digits and no clubbing    PSYCH: alert & oriented x 3 with fluent speech NEURO: no focal motor/sensory deficits Breasts: Breast inspection showed them to be symmetrical with no nipple discharge. (+) Mild skin pigmentation of left breast, no ulcers or skin peeling. Palpitation of both breasts and both axillas revealed no obvious mass that I could appreciate.   LABORATORY DATA:  I have reviewed the data as listed CBC Latest Ref Rng & Units 10/26/2016 04/15/2016 03/24/2016  WBC 3.9 - 10.3 10e3/uL 8.4 5.9 7.1  Hemoglobin 11.6 - 15.9 g/dL 12.4 13.1 11.9  Hematocrit 34.8 - 46.6 % 37.1 39.9 35.5  Platelets 145 - 400 10e3/uL 311 298 315   CMP Latest Ref Rng & Units 10/26/2016 04/15/2016 03/24/2016  Glucose 70 - 140 mg/dl 115 81 103(H)  BUN 7.0 - 26.0 mg/dL 12.5 13.4 15  Creatinine 0.6 - 1.1 mg/dL 0.7 0.8 0.80  Sodium 136 - 145 mEq/L 142 141 140  Potassium 3.5 - 5.1 mEq/L 4.1 4.2 4.2  Chloride 98 - 110 mmol/L - - 104  CO2 22 - 29 mEq/L 26 27 27   Calcium 8.4 - 10.4 mg/dL 9.9 9.8 9.6  Total Protein 6.4 - 8.3 g/dL 7.4 7.4 6.7  Total Bilirubin 0.20 - 1.20 mg/dL 0.30 0.43 0.5  Alkaline Phos 40 - 150 U/L 77 68 60  AST 5 - 34 U/L 25 16 26   ALT 0 - 55 U/L 42 16 21   PATHOLOGY REPORT Diagnosis 04/08/2016 Breast, left, needle core biopsy - INVASIVE DUCTAL CARCINOMA. - DUCTAL CARCINOMA IN SITU. - SEE COMMENT. Microscopic Comment Although definitive grading of breast carcinoma is best done on excision, the features of the invasive tumor from the left breast needle core biopsy are compatible with a grade 1-2 breast carcinoma. Breast prognostic markers will be performed and reported in an  addendum. Findings are called to [place] (  [place]) on 04/09/2016. Dr. Vicente Males has seen this case in consultation with agreement. (RH:ecj 04/09/2016)   Results: IMMUNOHISTOCHEMICAL AND MORPHOMETRIC ANALYSIS PERFORMED MANUALLY Estrogen Receptor: 100%, POSITIVE, STRONG STAINING INTENSITY Progesterone Receptor: 70%, POSITIVE, STRONG STAINING INTENSITY Proliferation Marker Ki67: 15%  Results: HER2 - NEGATIVE RATIO OF HER2/CEP17 SIGNALS 1.06 AVERAGE HER2 COPY NUMBER PER CELL 1.85  Diagnosis 04/22/2016 1. Breast, lumpectomy, Left - INVASIVE DUCTAL CARCINOMA, GRADE I/III, SPANNING 1.2 CM. - DUCTAL CARCINOMA IN SITU, LOW GRADE. - THE SURGICAL RESECTION MARGINS ARE NEGATIVE FOR CARCINOMA. - SEE ONCOLOGY TABLE BELOW. 2. Lymph node, sentinel, biopsy, Left Axillary #1 - THERE IS NO EVIDENCE OF CARCINOMA IN 1 OF 1 LYMPH NODE (0/1). 3. Lymph node, sentinel, biopsy, Left Axillary #2 - THERE IS NO EVIDENCE OF CARCINOMA IN 1 OF 1 LYMPH NODE (0/1). 4. Lymph node, sentinel, biopsy, Left Axillary #3 - THERE IS NO EVIDENCE OF CARCINOMA IN 1 OF 1 LYMPH NODE (0/1). Microscopic Comment 1. BREAST, INVASIVE TUMOR, WITH LYMPH NODES PRESENT Specimen, including laterality and lymph node sampling (sentinel, non-sentinel): Left breast and left axillary lymph nodes. Procedure: Lumpectomy and left axillary lymph node resection x 3. Histologic type: Ductal. Grade: I. Tubule formation: 2. Nuclear pleomorphism: 2. Mitotic: 1. Tumor size (gross measurement): 1.2 cm. Margins: Greater than 0.2 cm to all margins. Lymphovascular invasion: Not identified. Ductal carcinoma in situ: Present. Grade: Low grade. Extensive intraductal component: Not identified. Lobular neoplasia: Not identified. Tumor focality: Unifocal. Treatment effect: N/A. 1 of 3 FINAL for [facility], Kallie E ([id number]) Microscopic Comment(continued) Extent of tumor: Confined to breast parenchyma. Lymph  nodes: Examined: 3 Sentinel 0 Non-sentinel 3 Total Lymph nodes with metastasis: 0. Breast prognostic profile: [phone]. Estrogen receptor: 100%, strong staining intensity. Progesterone receptor: 70%, strong staining intensity. Her 2 neu: No amplification was detected. The ratio was 1.06. Ki-67: 15%. Non-neoplastic breast: No significant findings. TNM: pT1c, pN0. (JBK:ds 04/27/16)  ONCOTYPE DX RS 17, predicts 10 year distant recurrence risk of 11% with tamoxifen   RADIOGRAPHIC STUDIES: I have personally reviewed the radiological images as listed and agreed with the findings in the report.  Diagnostic mammogram and ultrasound of the left breast 04/02/2016 Impression: Suspicious of malignancy The 1 cm oval mass in the left breast is suspicious of malignancy, in the left upper outer quadrant posterior depth. Breast composition category C  DEXA 08/27/2016   ASSESSMENT & PLAN:  56 y.o. Caucasian post menopause female, with palpable left breast mass, which was not detected by screening mammogram 3 months ago.  1. Breast cancer of lower-outer quadrant of left female breast, invasive and in situ ductal carcinoma, grade 1, pT1cN0M0, stage IA, ER 100% positive, PR 70% positive, HER-2 negative -I reviewed her surgical pathology findings with her and her husband in details, she had early stage breast cancer, was resected completely. --I discussed the Oncotype Dx result was reviewed with her in details. She has low risk based on the recurrence score, which predicts 10 year distant recurrence after 5 years of tamoxifen 11%. She does not need adjuvant chemotherapy -She is on adjuvant letrozole, tolerating well so far, plan for total of 5 years  -She is clinically doing very well, lab reviewed, exam was unremarkable, no clinical concern for recurrence. -We'll continue breast cancer surveillance, with annual screening mammogram in routine follow-up with labs and exams. -She is due for mammogram  in June  2. Osteopenia  -She had repeated DEXA scan in 08/2016, which showed osteopenia, with T score -2.4 at AP  lumbar spine, no high risk features for fracture. -We discussed her that reaches on May increase her bone loss -I strongly encouraged her to continue calcium and vitamin D, and weight. Exercise -She is very close to osteoporosis, she may likely need biphosphonate in the near future. -We discussed that if her osteopenia gets worse, I may switch her letrozole to tamoxifen   Plan -continue letrozole this week -Lab and follow-up in 4 weeks -Mammogram in June 2018 in [place]  All questions were answered. The patient knows to call the clinic with any problems, questions or concerns. I spent 20 minutes counseling the patient face to face. The total time spent in the appointment was 25 minutes and more than 50% was on counseling.     Truitt Merle, MD 10/26/2016

## 2016-10-28 ENCOUNTER — Other Ambulatory Visit: Payer: BLUE CROSS/BLUE SHIELD

## 2016-10-28 ENCOUNTER — Ambulatory Visit: Payer: BLUE CROSS/BLUE SHIELD | Admitting: Hematology

## 2017-01-29 ENCOUNTER — Encounter: Payer: Self-pay | Admitting: Obstetrics & Gynecology

## 2017-01-29 ENCOUNTER — Ambulatory Visit (INDEPENDENT_AMBULATORY_CARE_PROVIDER_SITE_OTHER): Payer: BLUE CROSS/BLUE SHIELD | Admitting: Obstetrics & Gynecology

## 2017-01-29 ENCOUNTER — Telehealth: Payer: Self-pay | Admitting: Obstetrics & Gynecology

## 2017-01-29 VITALS — BP 112/60 | HR 88 | Resp 16 | Ht 65.0 in | Wt 143.0 lb

## 2017-01-29 DIAGNOSIS — N3090 Cystitis, unspecified without hematuria: Secondary | ICD-10-CM

## 2017-01-29 DIAGNOSIS — R3915 Urgency of urination: Secondary | ICD-10-CM

## 2017-01-29 LAB — POCT URINALYSIS DIPSTICK
BILIRUBIN UA: NEGATIVE
GLUCOSE UA: NEGATIVE
KETONES UA: NEGATIVE
NITRITE UA: NEGATIVE
Protein, UA: NEGATIVE
Urobilinogen, UA: 0.2 E.U./dL
pH, UA: 5 (ref 5.0–8.0)

## 2017-01-29 MED ORDER — SULFAMETHOXAZOLE-TRIMETHOPRIM 800-160 MG PO TABS: 1.0000 | ORAL_TABLET | Freq: Two times a day (BID) | ORAL | 0 refills | Status: DC

## 2017-01-29 NOTE — Progress Notes (Signed)
GYNECOLOGY  VISIT   HPI: 56 y.o. G75P0034 Married Caucasian female here for complaint of dysuria that started in the middle of the night.  This is the fourth time she's had symptoms of a UTI over the last four to five months.    GYNECOLOGIC HISTORY: Patient's last menstrual period was 05/12/2012. Contraception: Post menopausal  Menopausal hormone therapy: None  Patient Active Problem List   Diagnosis Date Noted  . Breast cancer of upper-outer quadrant of left female breast ([place]) 04/10/2016  . History of shingles 03/25/2016  . Rapid heart rate 08/16/2013  . Family history of early CAD 08/16/2013  . Migraine   . TMJ (temporomandibular joint syndrome)   . Abnormal Pap smear   . Schatzki's ring   . Vertigo     Past Medical History:  Diagnosis Date  . Abnormal Pap smear   . Breast cancer ([place])   . Elevated hemoglobin A1c June 2017   5.9  . GERD (gastroesophageal reflux disease)   . Migraine    uses imitrex as needed  . Schatzki's ring    and hiatal hernia on EGD  . Shingles rash 07/2015    shingles on right rib cage   . TMJ (temporomandibular joint syndrome)   . Vertigo     Past Surgical History:  Procedure Laterality Date  . BREAST BIOPSY  1/12   fibrocystic changes   . BREAST LUMPECTOMY WITH RADIOACTIVE SEED AND SENTINEL LYMPH NODE BIOPSY Left 04/22/2016   Procedure: BREAST LUMPECTOMY WITH RADIOACTIVE SEED AND SENTINEL LYMPH NODE BIOPSY;  Surgeon: Excell Seltzer, MD;  Location: [place];  Service: General;  Laterality: Left;  . DILATION AND CURETTAGE OF UTERUS  1991  . ESOPHAGEAL DILATION    . GYNECOLOGIC CRYOSURGERY  1980s  . LAPAROSCOPIC CHOLECYSTECTOMY  1992  . WISDOM TOOTH EXTRACTION      MEDS:  Reviewed in EPIC and UTD  ALLERGIES: Penicillins and Tylox [oxycodone-acetaminophen]  Family History  Problem Relation Age of Onset  . Diabetes Father   . Goiter Mother   . Heart disease      SH:  Married, non smoker  Review of Systems  All  other systems reviewed and are negative.   PHYSICAL EXAMINATION:    BP 112/60 (BP Location: Right Arm, Patient Position: Sitting, Cuff Size: Normal)   Pulse 88   Resp 16   Ht 5\' 5"  (1.651 m)   Wt 143 lb (64.9 kg)   LMP 05/12/2012   BMI 23.80 kg/m     General appearance: alert, cooperative and appears stated age Abdomen: soft, non-tender; bowel sounds normal; no masses,  no organomegaly Flank: no CVA tenderson   Chaperone was present for exam.  Assessment: Dysuria  Plan: Bactrim DS x 5 days.  #10/0RF D/w pt possible prophylaxis regimens.  Urine culture pending.  Results will be called to her.

## 2017-01-29 NOTE — Telephone Encounter (Signed)
Patient thinks she may be getting a UTI. Patient took an OTC medication and is not sure if this is helping. Patient sees Dr.Miller, no appointments available with Dr.Miller today.

## 2017-01-29 NOTE — Telephone Encounter (Signed)
Spoke with patient. Patient states that yesterday she began having trouble urinating and pain with urination. Started drinking cranberry juice and taking pyridium as needed. Denies any lower back pain, fever, or chills. Patient's symptoms have decreased today, but she has taken Pyridium this morning. Reports feeling fatigued. "I am not sure what is going on." Advised patient she will need to be seen for further evaluation. Advised I have reviewed with Dr.Miller and patient may be seen for work in today at 11:30 am. Patient declines appointment due to having to make a planned delivery for work that she cannot rearrange. Advised will need to review scheduling and return call. Patient is agreeable.  Reviewed with nursing manager Lamont Snowball, RN who states the patient may be worked in today at 1 pm.  Call to patient offered 1 pm appointment with Dr.Miller. Patient is agreeable to date and time. Appointment scheduled.  Routing to provider for final review. Patient agreeable to disposition. Will close encounter.

## 2017-01-31 LAB — URINE CULTURE

## 2017-01-31 NOTE — Addendum Note (Signed)
Addended by: Megan Salon on: 01/31/2017 09:34 PM   Modules accepted: Orders

## 2017-02-01 ENCOUNTER — Telehealth: Payer: Self-pay

## 2017-02-01 NOTE — Telephone Encounter (Signed)
-----   Message from Megan Salon, MD sent at 01/31/2017  9:33 PM EDT ----- Please let her know that her urine culture was positive for Kelbsiella--a common bacteria causing a UTI.  The antibiotics I gave her should have worked.  She needs a repeat urine culture in 7-10 days.  Order placed.  We discussed prophylaxis after intercourse for UTIs.  If she desires to try this for the next 3-6 months, I can send in rx for her.  Thanks.

## 2017-02-01 NOTE — Telephone Encounter (Signed)
Spoke with patient. Advised of results as seen below from [place]. Patient is agreeable and verbalizes understanding. Urine recheck scheduled for 02/08/2017 at 8:30 am. Patient is agreeable to date and time.  Routing to provider for final review. Patient agreeable to disposition. Will close encounter.

## 2017-02-08 ENCOUNTER — Ambulatory Visit (INDEPENDENT_AMBULATORY_CARE_PROVIDER_SITE_OTHER): Payer: BLUE CROSS/BLUE SHIELD | Admitting: *Deleted

## 2017-02-08 VITALS — BP 110/66 | HR 76 | Resp 16 | Ht 65.0 in | Wt 143.0 lb

## 2017-02-08 DIAGNOSIS — N3090 Cystitis, unspecified without hematuria: Secondary | ICD-10-CM | POA: Diagnosis not present

## 2017-02-08 DIAGNOSIS — N390 Urinary tract infection, site not specified: Secondary | ICD-10-CM | POA: Diagnosis not present

## 2017-02-08 NOTE — Progress Notes (Signed)
Patient in today for nurse visit. She states she finished her abx and feels better.  Urine culture sent.  Routed to Dr. Sabra Heck  Encounter closed.

## 2017-02-09 LAB — URINE CULTURE: Organism ID, Bacteria: NO GROWTH

## 2017-02-26 ENCOUNTER — Other Ambulatory Visit (HOSPITAL_BASED_OUTPATIENT_CLINIC_OR_DEPARTMENT_OTHER): Payer: BLUE CROSS/BLUE SHIELD

## 2017-02-26 ENCOUNTER — Ambulatory Visit (HOSPITAL_BASED_OUTPATIENT_CLINIC_OR_DEPARTMENT_OTHER): Payer: BLUE CROSS/BLUE SHIELD | Admitting: Hematology

## 2017-02-26 ENCOUNTER — Encounter: Payer: Self-pay | Admitting: Hematology

## 2017-02-26 ENCOUNTER — Telehealth: Payer: Self-pay | Admitting: Hematology

## 2017-02-26 VITALS — BP 113/75 | HR 87 | Temp 98.6°F | Resp 19 | Ht 65.0 in | Wt 141.2 lb

## 2017-02-26 DIAGNOSIS — Z170 Estrogen receptor positive status [ER+]: Secondary | ICD-10-CM

## 2017-02-26 DIAGNOSIS — M2550 Pain in unspecified joint: Secondary | ICD-10-CM

## 2017-02-26 DIAGNOSIS — Z79811 Long term (current) use of aromatase inhibitors: Secondary | ICD-10-CM

## 2017-02-26 DIAGNOSIS — C50412 Malignant neoplasm of upper-outer quadrant of left female breast: Secondary | ICD-10-CM

## 2017-02-26 DIAGNOSIS — M2560 Stiffness of unspecified joint, not elsewhere classified: Secondary | ICD-10-CM

## 2017-02-26 DIAGNOSIS — M859 Disorder of bone density and structure, unspecified: Secondary | ICD-10-CM

## 2017-02-26 LAB — CBC WITH DIFFERENTIAL/PLATELET
BASO%: 0.6 % (ref 0.0–2.0)
Basophils Absolute: 0 10*3/uL (ref 0.0–0.1)
EOS%: 3.8 % (ref 0.0–7.0)
Eosinophils Absolute: 0.3 10*3/uL (ref 0.0–0.5)
HCT: 38.8 % (ref 34.8–46.6)
HGB: 13.1 g/dL (ref 11.6–15.9)
LYMPH%: 27.9 % (ref 14.0–49.7)
MCH: 31.6 pg (ref 25.1–34.0)
MCHC: 33.8 g/dL (ref 31.5–36.0)
MCV: 93.6 fL (ref 79.5–101.0)
MONO#: 0.6 10*3/uL (ref 0.1–0.9)
MONO%: 9.1 % (ref 0.0–14.0)
NEUT%: 58.6 % (ref 38.4–76.8)
NEUTROS ABS: 4 10*3/uL (ref 1.5–6.5)
PLATELETS: 308 10*3/uL (ref 145–400)
RBC: 4.15 10*6/uL (ref 3.70–5.45)
RDW: 12.8 % (ref 11.2–14.5)
WBC: 6.8 10*3/uL (ref 3.9–10.3)
lymph#: 1.9 10*3/uL (ref 0.9–3.3)

## 2017-02-26 LAB — COMPREHENSIVE METABOLIC PANEL
ALT: 21 U/L (ref 0–55)
ANION GAP: 9 meq/L (ref 3–11)
AST: 21 U/L (ref 5–34)
Albumin: 4.5 g/dL (ref 3.5–5.0)
Alkaline Phosphatase: 73 U/L (ref 40–150)
BILIRUBIN TOTAL: 0.32 mg/dL (ref 0.20–1.20)
BUN: 19 mg/dL (ref 7.0–26.0)
CO2: 27 meq/L (ref 22–29)
CREATININE: 0.8 mg/dL (ref 0.6–1.1)
Calcium: 10.1 mg/dL (ref 8.4–10.4)
Chloride: 103 mEq/L (ref 98–109)
EGFR: 82 mL/min/{1.73_m2} — ABNORMAL LOW (ref >90)
GLUCOSE: 110 mg/dL (ref 70–140)
Potassium: 4 mEq/L (ref 3.5–5.1)
SODIUM: 139 meq/L (ref 136–145)
TOTAL PROTEIN: 7.4 g/dL (ref 6.4–8.3)

## 2017-02-26 NOTE — Telephone Encounter (Signed)
Gave patient AVS and calender per 5/18 los. Appt for [facility] scheduled for 6/21

## 2017-02-26 NOTE — Progress Notes (Signed)
[facility]  [phone] [phone]  Clinic Follow Up Note   Patient Care Team: Tiffany Amel, MD as PCP - General (Family Medicine) Tiffany Seltzer, MD as Consulting Physician (General Surgery) Tiffany Merle, MD as Consulting Physician (Hematology) Tiffany Rudd, MD as Consulting Physician (Radiation Oncology) 02/26/2017  CHIEF COMPLAINTS:  Follow up left breast cancer   Oncology History   Breast cancer of upper-outer quadrant of left female breast [facility])   Staging form: Breast, AJCC 7th Edition   - Clinical stage from 04/15/2016: Stage IA (T1b, N0, M0) - Unsigned         Staging comments: Staged at breast conference on 7.5.17   - Pathologic stage from 04/22/2016: Stage IA (T1c, N0, cM0) - Unsigned        Breast cancer of upper-outer quadrant of left female breast ([facility] [facility])   04/02/2016 Mammogram    1 cm oval massin the upper outer quadrant of left breast, suspicious for malignancy.       04/08/2016 Initial Diagnosis    Breast cancer of upper-outer quadrant of left female breast ([facility])      04/08/2016 Initial Biopsy    Left breast core needle biopsy showed invasive ductal carcinoma and DCIS, grade 1-2.      04/08/2016 Receptors her2    Your 100% positive, strong staining, PR 70% positive, strong staining, HER-2 negative, Ki-67 15%      04/22/2016 Surgery    Left breast lumpectomy and SLN biopsy ([facility])      04/22/2016 Pathology Results    Left breast lumpectomy showed invasive ductal carcinoma, grade 1, 1.2 cm, low-grade DCIS, surgical margins were negative, 3 sentinel lymph nodes negative.      04/22/2016 Oncotype testing    RS 17, low risk, predicts 10 year distant recurrence risk of 11% with tamoxifen      05/26/2016 - 07/13/2016 Radiation Therapy    Adjuvant breast radiation [facility]): Left Breast/ 50.4 Gy in 28 fx.  Boost/ 10 Gy in 5 fx      07/2016 -  Anti-estrogen oral therapy    Letrozole 2.5 mg daily. Planned duration of therapy:  5-10 years.       08/27/2016 Imaging    DEXA scan: Osteopenia. (T-score -2.4).        HISTORY OF PRESENTING ILLNESS:  Tiffany Nash 56 y.o. female is here because of Her newly diagnosed left breast cancer. She is accompanied by her husband to our multidisciplinary breast clinic today.  She had routine screening mammogram in March and 17 which was negative. On her routine follow-up with her primary care physician a few weeks ago, breast examination reviewed a left breast mass. No skin change or nipple discharge. She feels well, no pain or other complains. She underwent diagnostic mammogram and ultrasound on 04/02/2016, which showed a 1 cm oval mass in the upper outer quadrant of left breast, core needle biopsy showed invasive ductal carcinoma and DCIS, ER/PR positive, HER-2 negative.   She is married, lives with her husband, who had head and neck cancer and was treated in our cancer center 5-6 years ago. She has no family history of breast cancer or other malignancy. 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   GYN HISTORY  Menarchal: 13 LMP: 2014 Contraceptive: 6 years  HRT: no G7P4: she did breast feeding for 6 weeks for each children, ago of 28-19, 2 daughters   CURRENT THERAPY:  Letrozole 2.5 mg daily started in 07/2016   INTERIM HISTORY:  Tiffany Nash returns for follow-up. She presents to the clinic today with her husband. She reports she is well. On letrozole she takes it in the morning and thinks it others her joints and gets stiff feet. Worsens at night. Subsides with walking and reports no other joint pain. She is concerned that the symptoms of this letrozole is that it will not go away. Denies hot flashes but is warm. She remains active and moving. Over all she is tolerating Letrozole. She asked about [place] use with mammogram and why we no longer use it.   Her husband had cancer 5 years ago and is now cancer free    MEDICAL HISTORY:  Past Medical History:  Diagnosis Date  .  Abnormal Pap smear   . Breast cancer ([facility])   . Elevated hemoglobin A1c June 2017   5.9  . GERD (gastroesophageal reflux disease)   . Migraine    uses imitrex as needed  . Schatzki's ring    and hiatal hernia on EGD  . Shingles rash 07/2015    shingles on right rib cage   . TMJ (temporomandibular joint syndrome)   . Vertigo     SURGICAL HISTORY: Past Surgical History:  Procedure Laterality Date  . BREAST BIOPSY  1/12   fibrocystic changes   . BREAST LUMPECTOMY WITH RADIOACTIVE SEED AND SENTINEL LYMPH NODE BIOPSY Left 04/22/2016   Procedure: BREAST LUMPECTOMY WITH RADIOACTIVE SEED AND SENTINEL LYMPH NODE BIOPSY;  Surgeon: Tiffany Seltzer, MD;  Location: [facility];  Service: General;  Laterality: Left;  . DILATION AND CURETTAGE OF UTERUS  1991  . ESOPHAGEAL DILATION    . GYNECOLOGIC CRYOSURGERY  1980s  . LAPAROSCOPIC CHOLECYSTECTOMY  1992  . WISDOM TOOTH EXTRACTION      SOCIAL HISTORY: Social History   Social History  . Marital status: Married    Spouse name: N/A  . Number of children: N/A  . Years of education: N/A   Occupational History  . Not on file.   Social History Main Topics  . Smoking status: Never Smoker  . Smokeless tobacco: Never Used  . Alcohol use 1.2 oz/week    2 Standard drinks or equivalent per week     Comment: weekends only   . Drug use: No  . Sexual activity: Yes    Partners: Male     Comment: husband vasectomy   Other Topics Concern  . Not on file   Social History Narrative  . 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 Diabetes Father   . Goiter Mother   . Heart disease Unknown     ALLERGIES:  is allergic to penicillins and tylox [oxycodone-acetaminophen].  MEDICATIONS:  Current Outpatient Prescriptions  Medication Sig Dispense Refill  . Acetaminophen (TYLENOL PO) Take 1 tablet by mouth as needed.  Reported on 04/15/2016    . CALCIUM PO Take by mouth daily.    . Cholecalciferol (VITAMIN D PO) Take 2,000 Units by mouth every other day.     . famotidine (PEPCID) 20 MG tablet Take 1 tablet (20 mg total) by mouth as needed. 30 tablet 11  . ibuprofen (ADVIL,MOTRIN) 800 MG tablet Take 400 mg by mouth as needed for pain.     Marland Kitchen letrozole (FEMARA) 2.5 MG tablet Take 1 tablet (2.5 mg total) by mouth daily. 90 tablet 5  . Magnesium 100 MG CAPS  Take 2 capsules by mouth daily.    Marland Kitchen omeprazole (PRILOSEC) 20 MG capsule Take 20 mg by mouth 2 (two) times daily before a meal.    . polyethylene glycol (MIRALAX / GLYCOLAX) packet Take 17 g by mouth daily. Pt takes 1/2 dose daily.    . SUMAtriptan (IMITREX) 100 MG tablet TAKE 1 TABLET BY MOUTH IF NEEDED FOR MIGRAINES MAY REPEAT IN 2 HOURS IF HEADACHE PERSISTS 9 tablet 6   No current facility-administered medications for this visit.     REVIEW OF SYSTEMS:   Constitutional: Denies fevers, chills or abnormal night sweats Eyes: Denies blurriness of vision, double vision or watery eyes Ears, nose, mouth, throat, and face: Denies mucositis or sore throat Respiratory: Denies cough, dyspnea or wheezes Cardiovascular: Denies palpitation, chest discomfort or lower extremity swelling Gastrointestinal:  Denies nausea, heartburn or change in bowel habits Skin: Denies abnormal skin rashes Lymphatics: Denies new lymphadenopathy or easy bruising Neurological:Denies numbness, tingling or new weaknesses MSK: (+) Joint aches and stiffness mostly in feet Behavioral/Psych: Mood is stable, no new changes  All other systems were reviewed with the patient and are negative.  PHYSICAL EXAMINATION: ECOG PERFORMANCE STATUS: 0 - Asymptomatic  Vitals:   02/26/17 1528  BP: 113/75  Pulse: 87  Resp: 19  Temp: 98.6 F (37 C)   Filed Weights   02/26/17 1528  Weight: 141 lb 3.2 oz (64 kg)    GENERAL:alert, no distress and comfortable SKIN: skin color, texture, turgor are  normal, no rashes or significant lesions EYES: normal, conjunctiva are pink and non-injected, sclera clear OROPHARYNX:no exudate, no erythema and lips, buccal mucosa, and tongue normal  NECK: supple, thyroid normal size, non-tender, without nodularity LYMPH:  no palpable lymphadenopathy in the cervical, axillary or inguinal LUNGS: clear to auscultation and percussion with normal breathing effort HEART: regular rate & rhythm and no murmurs and no lower extremity edema ABDOMEN:abdomen soft, non-tender and normal bowel sounds Musculoskeletal:no cyanosis of digits and no clubbing    PSYCH: alert & oriented x 3 with fluent speech NEURO: no focal motor/sensory deficits Breasts: Breast inspection showed them to be symmetrical with no nipple discharge. (+) Mild skin pigmentation of left breast, no ulcers or skin peeling. Palpitation of both breasts and both axillas revealed no obvious mass that I could appreciate. (+) scar tissue next to incision, healing well.    LABORATORY DATA:  I have reviewed the data as listed CBC Latest Ref Rng & Units 02/26/2017 10/26/2016 04/15/2016  WBC 3.9 - 10.3 10e3/uL 6.8 8.4 5.9  Hemoglobin 11.6 - 15.9 g/dL 13.1 12.4 13.1  Hematocrit 34.8 - 46.6 % 38.8 37.1 39.9  Platelets 145 - 400 10e3/uL 308 311 298   CMP Latest Ref Rng & Units 02/26/2017 10/26/2016 04/15/2016  Glucose 70 - 140 mg/dl 110 115 81  BUN 7.0 - 26.0 mg/dL 19.0 12.5 13.4  Creatinine 0.6 - 1.1 mg/dL 0.8 0.7 0.8  Sodium 136 - 145 mEq/L 139 142 141  Potassium 3.5 - 5.1 mEq/L 4.0 4.1 4.2  Chloride 98 - 110 mmol/L - - -  CO2 22 - 29 mEq/L 27 26 27   Calcium 8.4 - 10.4 mg/dL 10.1 9.9 9.8  Total Protein 6.4 - 8.3 g/dL 7.4 7.4 7.4  Total Bilirubin 0.20 - 1.20 mg/dL 0.32 0.30 0.43  Alkaline Phos 40 - 150 U/L 73 77 68  AST 5 - 34 U/L 21 25 16   ALT 0 - 55 U/L 21 42 16   PATHOLOGY REPORT Diagnosis 04/08/2016 Breast, left,  needle core biopsy - INVASIVE DUCTAL CARCINOMA. - DUCTAL CARCINOMA IN SITU. - SEE  COMMENT. Microscopic Comment Although definitive grading of breast carcinoma is best done on excision, the features of the invasive tumor from the left breast needle core biopsy are compatible with a grade 1-2 breast carcinoma. Breast prognostic markers will be performed and reported in an addendum. Findings are called to [facility] ([facility]) on 04/09/2016. Dr. Vicente Males has seen this case in consultation with agreement. (RH:ecj 04/09/2016)   Results: IMMUNOHISTOCHEMICAL AND MORPHOMETRIC ANALYSIS PERFORMED MANUALLY Estrogen Receptor: 100%, POSITIVE, STRONG STAINING INTENSITY Progesterone Receptor: 70%, POSITIVE, STRONG STAINING INTENSITY Proliferation Marker Ki67: 15%  Results: HER2 - NEGATIVE RATIO OF HER2/CEP17 SIGNALS 1.06 AVERAGE HER2 COPY NUMBER PER CELL 1.85  Diagnosis 04/22/2016 1. Breast, lumpectomy, Left - INVASIVE DUCTAL CARCINOMA, GRADE I/III, SPANNING 1.2 CM. - DUCTAL CARCINOMA IN SITU, LOW GRADE. - THE SURGICAL RESECTION MARGINS ARE NEGATIVE FOR CARCINOMA. - SEE ONCOLOGY TABLE BELOW. 2. Lymph node, sentinel, biopsy, Left Axillary #1 - THERE IS NO EVIDENCE OF CARCINOMA IN 1 OF 1 LYMPH NODE (0/1). 3. Lymph node, sentinel, biopsy, Left Axillary #2 - THERE IS NO EVIDENCE OF CARCINOMA IN 1 OF 1 LYMPH NODE (0/1). 4. Lymph node, sentinel, biopsy, Left Axillary #3 - THERE IS NO EVIDENCE OF CARCINOMA IN 1 OF 1 LYMPH NODE (0/1). Microscopic Comment 1. BREAST, INVASIVE TUMOR, WITH LYMPH NODES PRESENT Specimen, including laterality and lymph node sampling (sentinel, non-sentinel): Left breast and left axillary lymph nodes. Procedure: Lumpectomy and left axillary lymph node resection x 3. Histologic type: Ductal. Grade: I. Tubule formation: 2. Nuclear pleomorphism: 2. Mitotic: 1. Tumor size (gross measurement): 1.2 cm. Margins: Greater than 0.2 cm to all margins. Lymphovascular invasion: Not identified. Ductal carcinoma in situ:  Present. Grade: Low grade. Extensive intraductal component: Not identified. Lobular neoplasia: Not identified. Tumor focality: Unifocal. Treatment effect: N/A. 1 of 3 FINAL for [facility], Tiffany Nash ([id number]) Microscopic Comment(continued) Extent of tumor: Confined to breast parenchyma. Lymph nodes: Examined: 3 Sentinel 0 Non-sentinel 3 Total Lymph nodes with metastasis: 0. Breast prognostic profile: [phone]. Estrogen receptor: 100%, strong staining intensity. Progesterone receptor: 70%, strong staining intensity. Her 2 neu: No amplification was detected. The ratio was 1.06. Ki-67: 15%. Non-neoplastic breast: No significant findings. TNM: pT1c, pN0. (JBK:ds 04/27/16)  ONCOTYPE DX RS 17, predicts 10 year distant recurrence risk of 11% with tamoxifen   RADIOGRAPHIC STUDIES: I have personally reviewed the radiological images as listed and agreed with the findings in the report.  Diagnostic mammogram and ultrasound of the left breast 04/02/2016 Impression: Suspicious of malignancy The 1 cm oval mass in the left breast is suspicious of malignancy, in the left upper outer quadrant posterior depth. Breast composition category C  DEXA 08/27/2016   ASSESSMENT & PLAN:  56 y.o. Caucasian post menopause female, with palpable left breast mass, which was not detected by screening mammogram 3 months ago.  1. Breast cancer of lower-outer quadrant of left female breast, invasive and in situ ductal carcinoma, grade 1, pT1cN0M0, stage IA, ER 100% positive, PR 70% positive, HER-2 negative -I reviewed her surgical pathology findings with her and her husband in details, she had early stage breast cancer, was resected completely. --I discussed the Oncotype Dx result was reviewed with her in details. She has low risk based on the recurrence score, which predicts 10 year distant recurrence after 5 years of tamoxifen 11%. She does not need adjuvant chemotherapy -She is on adjuvant letrozole,  tolerating  well so far, plan for total of 5 years  -She does have joint stiffness and a mild arthralgia, related to letrozole. This is manageable. We discussed the alternative antiestrogen therapy, such as tamoxifen, after lengthy discussion, she decided to continue letrozole. -She is clinically doing very well, lab reviewed, exam was unremarkable, no clinical concern for recurrence. -We'll continue breast cancer surveillance, with annual screening mammogram in routine follow-up with labs and exams. -She is due for mammogram in June still not scheduled; will schedule for her  -Labs reviewed and adequate to continue with letrozole. -Pt asked if she can have breast US to screen for cancer in the breast in additional to mammogram. I explained that [place] is not standard tool for cancer screening, but will be used if she has abnormal mammogram. Will continue annual diagnostic mammogram for screening along with regular breast exam and home self breast exam.    2. Osteopenia  -She had repeated DEXA scan in 08/2016, which showed osteopenia, with T score -2.4 at AP lumbar spine, no high risk features for fracture. -We discussed her that reaches on May increase her bone loss -I strongly encouraged her to continue calcium and vitamin D, and weight. Exercise -She is very close to osteoporosis, she may likely need biphosphonate in the near future. -We previously discussed that if her osteopenia gets worse, I may switch her letrozole to tamoxifen -She is not consistently calcium citrate and D3 -We discussed the amount of calcium and Vitamin D that she should be taking due to her close level to osteoporosis.    3. Joint Pain/Stiffness  -secondary to letrozole -She is concerned that this will not fully resolve after ended letrozole  -We discussed the other options of and S2 therapy such as tamoxifen, if her stiffness and arthralgia gets worse.    Plan -labs and f/u in 4 weeks -Mammogram in June 2018 at  [facility]   All questions were answered. The patient knows to call the clinic with any problems, questions or concerns. I spent 20 minutes counseling the patient face to face. The total time spent in the appointment was 25 minutes and more than 50% was on counseling.  This document serves as a record of services personally performed by Tiffany Merle, MD. It was created on her behalf by Joslyn Devon, a trained medical scribe. The creation of this record is based on the scribe's personal observations and the provider's statements to them. This document has been checked and approved by the attending provider.     Tiffany Merle, MD 02/26/2017

## 2017-02-27 ENCOUNTER — Encounter: Payer: Self-pay | Admitting: Hematology

## 2017-06-29 ENCOUNTER — Encounter: Payer: Self-pay | Admitting: Obstetrics & Gynecology

## 2017-06-29 NOTE — Progress Notes (Signed)
56 y.o. [id number] MarriedCaucasianF here for annual exam.  Doing well.  Denies vaginal bleeding.  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  Going to cancer center tomorrow.  Has blood work scheduled there.  Needs HbA1C done as well but I cannot add onto labs done there.  She would like this done today with lab work for tomorrow if possible.  Has CMP and CBC with diff ordered for tomorrow.  PCP:  Dr. Docia Chuck, [place].    Patient's last menstrual period was 05/12/2012.          Sexually active: Yes.    The current method of family planning is post menopausal status.    Exercising: Yes.    walking, biking Smoker:  no  Health Maintenance: Pap:  01/11/15 negative, HR HPV negative, 01/05/14 negative  History of abnormal Pap:  yes MMG:  04/01/17 BIRADS 2 benign  Colonoscopy:  2011  BMD:   08/27/2016 osteopenia  TDaP:  UTD  Pneumonia vaccine(s): never Zostavax:   never Hep C testing: 03/24/16 negative  Screening Labs: discuss with provider, Hb today: discuss with provider   reports that she has never smoked. She has never used smokeless tobacco. She reports that she drinks about 1.2 oz of alcohol per week . She reports that she does not use drugs.  Past Medical History:  Diagnosis Date  . Abnormal Pap smear   . Breast cancer ([facility])   . Elevated hemoglobin A1c June 2017   5.9  . GERD (gastroesophageal reflux disease)   . Migraine    uses imitrex as needed  . Schatzki's ring    and hiatal hernia on EGD  . Shingles rash 07/2015    shingles on right rib cage   . TMJ (temporomandibular joint syndrome)   . Vertigo     Past Surgical History:  Procedure Laterality Date  . BREAST BIOPSY  1/12   fibrocystic changes   . BREAST LUMPECTOMY WITH RADIOACTIVE SEED AND SENTINEL LYMPH NODE BIOPSY Left 04/22/2016   Procedure: BREAST LUMPECTOMY WITH RADIOACTIVE SEED AND SENTINEL LYMPH NODE BIOPSY;  Surgeon: Glenna Fellows, MD;  Location: [facility];   Service: General;  Laterality: Left;  . DILATION AND CURETTAGE OF UTERUS  1991  . ESOPHAGEAL DILATION    . GYNECOLOGIC CRYOSURGERY  1980s  . LAPAROSCOPIC CHOLECYSTECTOMY  1992  . WISDOM TOOTH EXTRACTION      Current Outpatient Prescriptions  Medication Sig Dispense Refill  . Acetaminophen (TYLENOL PO) Take 1 tablet by mouth as needed. Reported on 04/15/2016    . ASPIRIN PO Take 81 mg by mouth daily.    Marland Kitchen CALCIUM PO Take by mouth daily.    . Cholecalciferol (VITAMIN D PO) Take 2,000 Units by mouth every other day.     . famotidine (PEPCID) 20 MG tablet Take 1 tablet (20 mg total) by mouth as needed. 30 tablet 11  . ibuprofen (ADVIL,MOTRIN) 800 MG tablet Take 400 mg by mouth as needed for pain.     Marland Kitchen letrozole (FEMARA) 2.5 MG tablet Take 1 tablet (2.5 mg total) by mouth daily. 90 tablet 5  . Magnesium 100 MG CAPS Take 2 capsules by mouth daily.    Marland Kitchen MAGNESIUM PO Take by mouth daily.    . Misc Natural Products (TART CHERRY ADVANCED) CAPS Take by mouth daily.    . NON FORMULARY 2 tablets daily. Juice Plus Vegetables    . NON FORMULARY 2 tablets daily.  Juice Plus Fruits    . omeprazole (PRILOSEC) 20 MG capsule Take 20 mg by mouth 2 (two) times daily before a meal.    . polyethylene glycol (MIRALAX / GLYCOLAX) packet Take 17 g by mouth daily. Pt takes 1/2 dose daily.    . SUMAtriptan (IMITREX) 100 MG tablet TAKE 1 TABLET BY MOUTH IF NEEDED FOR MIGRAINES MAY REPEAT IN 2 HOURS IF HEADACHE PERSISTS 9 tablet 6   No current facility-administered medications for this visit.     Family History  Problem Relation Age of Onset  . Diabetes Father   . Goiter Mother   . Heart disease Unknown     ROS:  Pertinent items are noted in HPI.  Otherwise, a comprehensive ROS was negative.  Exam:   BP 96/60 (BP Location: Right Arm, Patient Position: Sitting, Cuff Size: Normal)   Pulse 82   Resp 14   Ht 5' 5.5" (1.664 m)   Wt 146 lb (66.2 kg)   LMP 05/12/2012   BMI 23.93 kg/m   Height: 5' 5.5" (166.4  cm)  Ht Readings from Last 3 Encounters:  07/01/17 5' 5.5" (1.664 m)  02/26/17 5' 5" (1.651 m)  02/08/17 5' 5" (1.651 m)    General appearance: alert, cooperative and appears stated age Head: Normocephalic, without obvious abnormality, atraumatic Neck: no adenopathy, supple, symmetrical, trachea midline and thyroid normal to inspection and palpation Lungs: clear to auscultation bilaterally Breasts: left breast with well healed scars and radiation changes, stable, no LAD, right breast without skin changes, nipple discharge, or LAD Heart: regular rate and rhythm Abdomen: soft, non-tender; bowel sounds normal; no masses,  no organomegaly Extremities: extremities normal, atraumatic, no cyanosis or edema Skin: Skin color, texture, turgor normal. No rashes or lesions Lymph nodes: Cervical, supraclavicular, and axillary nodes normal. No abnormal inguinal nodes palpated Neurologic: Grossly normal   Pelvic: External genitalia:  no lesions              Urethra:  normal appearing urethra with no masses, tenderness or lesions              Bartholins and Skenes: normal                 Vagina: normal appearing vagina with normal color and discharge, no lesions              Cervix: no lesions              Pap taken: No. Bimanual Exam:  Uterus:  normal size, contour, position, consistency, mobility, non-tender              Adnexa: normal adnexa and no mass, fullness, tenderness               Rectovaginal: Confirms               Anus:  normal sphincter tone, no lesions  Chaperone was present for exam.  A:  Well Woman with normal exam PMP, no HRT H/O breast cancer, ER/PR positive, Her-2 negative, Stage IA, on Letrazole now for five years H/O migraines, much improved since menopause  P:   Mammogram guidelines reviewed.  Doing 3D. pap smear and HR HPV 2016.  No pap today.  Will repeat both next year. HbA1C, CMP, and CBC with diff obtained today Shinrix vaccination discussed BMD due 1 year  unless Dr. Burr Medico thinks should be done this year. Return annually or prn

## 2017-07-01 ENCOUNTER — Encounter: Payer: Self-pay | Admitting: Obstetrics & Gynecology

## 2017-07-01 ENCOUNTER — Ambulatory Visit (INDEPENDENT_AMBULATORY_CARE_PROVIDER_SITE_OTHER): Payer: 59 | Admitting: Obstetrics & Gynecology

## 2017-07-01 VITALS — BP 96/60 | HR 82 | Resp 14 | Ht 65.5 in | Wt 146.0 lb

## 2017-07-01 DIAGNOSIS — Z01419 Encounter for gynecological examination (general) (routine) without abnormal findings: Secondary | ICD-10-CM | POA: Diagnosis not present

## 2017-07-01 DIAGNOSIS — R7309 Other abnormal glucose: Secondary | ICD-10-CM | POA: Diagnosis not present

## 2017-07-01 DIAGNOSIS — Z853 Personal history of malignant neoplasm of breast: Secondary | ICD-10-CM | POA: Diagnosis not present

## 2017-07-01 MED ORDER — SUMATRIPTAN SUCCINATE 100 MG PO TABS: 100.0000 mg | ORAL_TABLET | Freq: Once | ORAL | 6 refills | Status: DC

## 2017-07-02 ENCOUNTER — Encounter: Payer: Self-pay | Admitting: Hematology

## 2017-07-02 ENCOUNTER — Ambulatory Visit (HOSPITAL_BASED_OUTPATIENT_CLINIC_OR_DEPARTMENT_OTHER): Payer: 59 | Admitting: Hematology

## 2017-07-02 ENCOUNTER — Telehealth: Payer: Self-pay | Admitting: Hematology

## 2017-07-02 ENCOUNTER — Other Ambulatory Visit: Payer: BLUE CROSS/BLUE SHIELD

## 2017-07-02 VITALS — BP 120/69 | HR 81 | Temp 98.8°F | Resp 18 | Ht 65.5 in | Wt 146.2 lb

## 2017-07-02 DIAGNOSIS — Z79811 Long term (current) use of aromatase inhibitors: Secondary | ICD-10-CM | POA: Diagnosis not present

## 2017-07-02 DIAGNOSIS — M8580 Other specified disorders of bone density and structure, unspecified site: Secondary | ICD-10-CM

## 2017-07-02 DIAGNOSIS — C50412 Malignant neoplasm of upper-outer quadrant of left female breast: Secondary | ICD-10-CM | POA: Diagnosis not present

## 2017-07-02 DIAGNOSIS — M2550 Pain in unspecified joint: Secondary | ICD-10-CM

## 2017-07-02 DIAGNOSIS — M2560 Stiffness of unspecified joint, not elsewhere classified: Secondary | ICD-10-CM

## 2017-07-02 DIAGNOSIS — Z170 Estrogen receptor positive status [ER+]: Secondary | ICD-10-CM | POA: Diagnosis not present

## 2017-07-02 LAB — CBC WITH DIFFERENTIAL/PLATELET
BASOS: 0 %
Basophils Absolute: 0 10*3/uL (ref 0.0–0.2)
EOS (ABSOLUTE): 0.2 10*3/uL (ref 0.0–0.4)
EOS: 4 %
HEMATOCRIT: 38 % (ref 34.0–46.6)
Hemoglobin: 12.9 g/dL (ref 11.1–15.9)
Immature Grans (Abs): 0 10*3/uL (ref 0.0–0.1)
Immature Granulocytes: 0 %
LYMPHS ABS: 1.6 10*3/uL (ref 0.7–3.1)
Lymphs: 29 %
MCH: 31.5 pg (ref 26.6–33.0)
MCHC: 33.9 g/dL (ref 31.5–35.7)
MCV: 93 fL (ref 79–97)
MONOS ABS: 0.4 10*3/uL (ref 0.1–0.9)
Monocytes: 8 %
NEUTROS ABS: 3.3 10*3/uL (ref 1.4–7.0)
NEUTROS PCT: 59 %
PLATELETS: 320 10*3/uL (ref 150–379)
RBC: 4.09 x10E6/uL (ref 3.77–5.28)
RDW: 13.2 % (ref 12.3–15.4)
WBC: 5.5 10*3/uL (ref 3.4–10.8)

## 2017-07-02 LAB — COMPREHENSIVE METABOLIC PANEL
A/G RATIO: 2 (ref 1.2–2.2)
ALK PHOS: 67 IU/L (ref 39–117)
ALT: 21 IU/L (ref 0–32)
AST: 26 IU/L (ref 0–40)
Albumin: 4.9 g/dL (ref 3.5–5.5)
BILIRUBIN TOTAL: 0.3 mg/dL (ref 0.0–1.2)
BUN / CREAT RATIO: 18 (ref 9–23)
BUN: 14 mg/dL (ref 6–24)
CALCIUM: 10 mg/dL (ref 8.7–10.2)
CO2: 28 mmol/L (ref 20–29)
CREATININE: 0.77 mg/dL (ref 0.57–1.00)
Chloride: 101 mmol/L (ref 96–106)
GFR calc Af Amer: 99 mL/min/{1.73_m2} (ref >59)
GFR calc non Af Amer: 86 mL/min/{1.73_m2} (ref >59)
GLOBULIN, TOTAL: 2.5 g/dL (ref 1.5–4.5)
Glucose: 83 mg/dL (ref 65–99)
Potassium: 4.4 mmol/L (ref 3.5–5.2)
Sodium: 144 mmol/L (ref 134–144)
Total Protein: 7.4 g/dL (ref 6.0–8.5)

## 2017-07-02 LAB — HEMOGLOBIN A1C
Est. average glucose Bld gHb Est-mCnc: 123 mg/dL
Hgb A1c MFr Bld: 5.9 % — ABNORMAL HIGH (ref 4.8–5.6)

## 2017-07-02 NOTE — Telephone Encounter (Signed)
Gave avs and calendar for march 2019 

## 2017-07-02 NOTE — Progress Notes (Signed)
[name]  [phone] [phone]  Clinic Follow Up Note   Patient Care Team: Lujean Amel, MD as PCP - General (Family Medicine) Excell Seltzer, MD as Consulting Physician (General Surgery) Truitt Merle, MD as Consulting Physician (Hematology) Kyung Rudd, MD as Consulting Physician (Radiation Oncology) 07/02/2017  CHIEF COMPLAINTS:  Follow up left breast cancer   Oncology History   Breast cancer of upper-outer quadrant of left female breast [facility])   Staging form: Breast, AJCC 7th Edition   - Clinical stage from 04/15/2016: Stage IA (T1b, N0, M0) - Unsigned         Staging comments: Staged at breast conference on 7.5.17   - Pathologic stage from 04/22/2016: Stage IA (T1c, N0, cM0) - Unsigned        Breast cancer of upper-outer quadrant of left female breast ([place])   04/02/2016 Mammogram    1 cm oval massin the upper outer quadrant of left breast, suspicious for malignancy.       04/08/2016 Initial Diagnosis    Breast cancer of upper-outer quadrant of left female breast ([place])      04/08/2016 Initial Biopsy    Left breast core needle biopsy showed invasive ductal carcinoma and DCIS, grade 1-2.      04/08/2016 Receptors her2    Your 100% positive, strong staining, PR 70% positive, strong staining, HER-2 negative, Ki-67 15%      04/22/2016 Surgery    Left breast lumpectomy and SLN biopsy ([place])      04/22/2016 Pathology Results    Left breast lumpectomy showed invasive ductal carcinoma, grade 1, 1.2 cm, low-grade DCIS, surgical margins were negative, 3 sentinel lymph nodes negative.      04/22/2016 Oncotype testing    RS 17, low risk, predicts 10 year distant recurrence risk of 11% with tamoxifen      05/26/2016 - 07/13/2016 Radiation Therapy    Adjuvant breast radiation [facility] ([place])): Left Breast/ 50.4 Gy in 28 fx.  Boost/ 10 Gy in 5 fx      07/2016 -  Anti-estrogen oral therapy    Letrozole 2.5 mg daily. Planned duration of therapy:  5-10 years.       08/27/2016 Imaging    DEXA scan: Osteopenia. (T-score -2.4).        HISTORY OF PRESENTING ILLNESS:  Tiffany Nash 56 y.o. female is here because of Her newly diagnosed left breast cancer. She is accompanied by her husband to our multidisciplinary breast clinic today.  She had routine screening mammogram in March and 17 which was negative. On her routine follow-up with her primary care physician a few weeks ago, breast examination reviewed a left breast mass. No skin change or nipple discharge. She feels well, no pain or other complains. She underwent diagnostic mammogram and ultrasound on 04/02/2016, which showed a 1 cm oval mass in the upper outer quadrant of left breast, core needle biopsy showed invasive ductal carcinoma and DCIS, ER/PR positive, HER-2 negative.   She is married, lives with her husband, who had head and neck cancer and was treated in our cancer center 5-6 years ago. She has no family history of breast cancer or other malignancy. 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   GYN HISTORY  Menarchal: 13 LMP: 2014 Contraceptive: 6 years  HRT: no G7P4: she did breast feeding for 6 weeks for each children, ago of 28-19, 2 daughters   CURRENT THERAPY:  Letrozole 2.5 mg daily started in 07/2016   INTERIM HISTORY:  Diann returns for follow-up. She presents to the clinic today with her husband.  She notes her joint pain has improved. She feels tired during the day and does not get sleep at night much. She tries to get 8 hours but takes benadryl for her to get a good night sleep. She had this sleep issue before letrozole. She tries Melatonin and did not do much for her.  She notes her mood is fine, she does not feel down or depressed.  She is overall tolerating letrozole well but wanted to know her other options.  She notes to having vaginal dryness but she will tolerate it.  Her husband asks if she is fine to take baby Asprin once daily.  She was  exercising in the past but does not feel as motivated lately.    MEDICAL HISTORY:  Past Medical History:  Diagnosis Date  . Abnormal Pap smear   . Breast cancer ([place])   . Elevated hemoglobin A1c June 2017   5.9  . GERD (gastroesophageal reflux disease)   . Migraine    uses imitrex as needed  . Schatzki's ring    and hiatal hernia on EGD  . Shingles rash 07/2015    shingles on right rib cage   . TMJ (temporomandibular joint syndrome)   . Vertigo     SURGICAL HISTORY: Past Surgical History:  Procedure Laterality Date  . BREAST BIOPSY  1/12   fibrocystic changes   . BREAST LUMPECTOMY WITH RADIOACTIVE SEED AND SENTINEL LYMPH NODE BIOPSY Left 04/22/2016   Procedure: BREAST LUMPECTOMY WITH RADIOACTIVE SEED AND SENTINEL LYMPH NODE BIOPSY;  Surgeon: Excell Seltzer, MD;  Location: [facility];  Service: General;  Laterality: Left;  . DILATION AND CURETTAGE OF UTERUS  1991  . ESOPHAGEAL DILATION    . GYNECOLOGIC CRYOSURGERY  1980s  . LAPAROSCOPIC CHOLECYSTECTOMY  1992  . WISDOM TOOTH EXTRACTION      SOCIAL HISTORY: Social History   Social History  . Marital status: Married    Spouse name: N/A  . Number of children: N/A  . Years of education: N/A   Occupational History  . Not on file.   Social History Main Topics  . Smoking status: Never Smoker  . Smokeless tobacco: Never Used  . Alcohol use 1.2 oz/week    2 Standard drinks or equivalent per week     Comment: weekends only   . Drug use: No  . Sexual activity: Yes    Partners: Male     Comment: husband vasectomy   Other Topics Concern  . Not on file   Social History Narrative  . 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 Diabetes Father   . Goiter Mother   . Heart disease Unknown     ALLERGIES:  is allergic to penicillins and tylox [oxycodone-acetaminophen].  MEDICATIONS:  Current  Outpatient Prescriptions  Medication Sig Dispense Refill  . Acetaminophen (TYLENOL PO) Take 1 tablet by mouth as needed. Reported on 04/15/2016    . ASPIRIN PO Take 81 mg by mouth daily.    Marland Kitchen CALCIUM PO Take by mouth daily.    . Cholecalciferol (VITAMIN D PO) Take 2,000 Units by mouth every other day.     . famotidine (PEPCID) 20 MG tablet Take 1 tablet (20 mg total) by mouth as needed. 30 tablet 11  . ibuprofen (ADVIL,MOTRIN)  800 MG tablet Take 400 mg by mouth as needed for pain.     Marland Kitchen letrozole (FEMARA) 2.5 MG tablet Take 1 tablet (2.5 mg total) by mouth daily. 90 tablet 5  . Magnesium 100 MG CAPS Take 2 capsules by mouth daily.    Marland Kitchen MAGNESIUM PO Take by mouth daily.    . Misc Natural Products (TART CHERRY ADVANCED) CAPS Take by mouth daily.    . NON FORMULARY 2 tablets daily. Juice Plus Vegetables    . NON FORMULARY 2 tablets daily. Juice Plus Fruits    . omeprazole (PRILOSEC) 20 MG capsule Take 20 mg by mouth 2 (two) times daily before a meal.    . polyethylene glycol (MIRALAX / GLYCOLAX) packet Take 17 g by mouth daily. Pt takes 1/2 dose daily.    . SUMAtriptan (IMITREX) 100 MG tablet Take 1 tablet (100 mg total) by mouth once. May repeat in 2 hours if headache persists or recurs. 9 tablet 6   No current facility-administered medications for this visit.     REVIEW OF SYSTEMS:   Constitutional: Denies fevers, chills or abnormal night sweats (+) lowered energy Eyes: Denies blurriness of vision, double vision or watery eyes Ears, nose, mouth, throat, and face: Denies mucositis or sore throat Respiratory: Denies cough, dyspnea or wheezes Cardiovascular: Denies palpitation, chest discomfort or lower extremity swelling Gastrointestinal:  Denies nausea, heartburn or change in bowel habits Skin: Denies abnormal skin rashes Lymphatics: Denies new lymphadenopathy or easy bruising Neurological:Denies numbness, tingling or new weaknesses MSK: (+) Joint aches and stiffness mostly in feet,  improved lately Behavioral/Psych: Mood is stable, no new changes  All other systems were reviewed with the patient and are negative.  PHYSICAL EXAMINATION: ECOG PERFORMANCE STATUS: 0 - Asymptomatic  Vitals:   07/02/17 1413  BP: 120/69  Pulse: 81  Resp: 18  Temp: 98.8 F (37.1 C)  SpO2: 99%   Filed Weights   07/02/17 1413  Weight: 146 lb 3.2 oz (66.3 kg)    GENERAL:alert, no distress and comfortable SKIN: skin color, texture, turgor are normal, no rashes or significant lesions EYES: normal, conjunctiva are pink and non-injected, sclera clear OROPHARYNX:no exudate, no erythema and lips, buccal mucosa, and tongue normal  NECK: supple, thyroid normal size, non-tender, without nodularity LYMPH:  no palpable lymphadenopathy in the cervical, axillary or inguinal LUNGS: clear to auscultation and percussion with normal breathing effort HEART: regular rate & rhythm and no murmurs and no lower extremity edema ABDOMEN:abdomen soft, non-tender and normal bowel sounds Musculoskeletal:no cyanosis of digits and no clubbing    PSYCH: alert & oriented x 3 with fluent speech NEURO: no focal motor/sensory deficits Breasts: Breast inspection showed them to be symmetrical with no nipple discharge. Palpitation of both breasts and both axillas revealed no obvious mass that I could appreciate. (+) mild scar tissue next to incision.    LABORATORY DATA:  I have reviewed the data as listed CBC Latest Ref Rng & Units 07/01/2017 02/26/2017 10/26/2016  WBC 3.4 - 10.8 x10E3/uL 5.5 6.8 8.4  Hemoglobin 11.1 - 15.9 g/dL 12.9 13.1 12.4  Hematocrit 34.0 - 46.6 % 38.0 38.8 37.1  Platelets 150 - 379 x10E3/uL 320 308 311   CMP Latest Ref Rng & Units 07/01/2017 02/26/2017 10/26/2016  Glucose 65 - 99 mg/dL 83 110 115  BUN 6 - 24 mg/dL 14 19.0 12.5  Creatinine 0.57 - 1.00 mg/dL 0.77 0.8 0.7  Sodium 134 - 144 mmol/L 144 139 142  Potassium 3.5 - 5.2 mmol/L  4.4 4.0 4.1  Chloride 96 - 106 mmol/L 101 - -  CO2 20 - 29  mmol/L _0 Calcium 8.7 - 10.2 mg/dL 10.0 10.1 9.9  Total Protein 6.0 - 8.5 g/dL 7.4 7.4 7.4  Total Bilirubin 0.0 - 1.2 mg/dL 0.3 0.32 0.30  Alkaline Phos 39 - 117 IU/L 67 73 77  AST 0 - 40 IU/L _1 ALT 0 - 32 IU/L 21 21 42   PATHOLOGY REPORT Diagnosis 04/08/2016 Breast, left, needle core biopsy - INVASIVE DUCTAL CARCINOMA. - DUCTAL CARCINOMA IN SITU. - SEE COMMENT. Microscopic Comment Although definitive grading of breast carcinoma is best done on excision, the features of the invasive tumor from the left breast needle core biopsy are compatible with a grade 1-2 breast carcinoma. Breast prognostic markers will be performed and reported in an addendum. Findings are called to [place] ([place]) on 04/09/2016. Dr. Vicente Males has seen this case in consultation with agreement. (RH:ecj 04/09/2016)   Results: IMMUNOHISTOCHEMICAL AND MORPHOMETRIC ANALYSIS PERFORMED MANUALLY Estrogen Receptor: 100%, POSITIVE, STRONG STAINING INTENSITY Progesterone Receptor: 70%, POSITIVE, STRONG STAINING INTENSITY Proliferation Marker Ki67: 15%  Results: HER2 - NEGATIVE RATIO OF HER2/CEP17 SIGNALS 1.06 AVERAGE HER2 COPY NUMBER PER CELL 1.85  Diagnosis 04/22/2016 1. Breast, lumpectomy, Left - INVASIVE DUCTAL CARCINOMA, GRADE I/III, SPANNING 1.2 CM. - DUCTAL CARCINOMA IN SITU, LOW GRADE. - THE SURGICAL RESECTION MARGINS ARE NEGATIVE FOR CARCINOMA. - SEE ONCOLOGY TABLE BELOW. 2. Lymph node, sentinel, biopsy, Left Axillary #1 - THERE IS NO EVIDENCE OF CARCINOMA IN 1 OF 1 LYMPH NODE (0/1). 3. Lymph node, sentinel, biopsy, Left Axillary #2 - THERE IS NO EVIDENCE OF CARCINOMA IN 1 OF 1 LYMPH NODE (0/1). 4. Lymph node, sentinel, biopsy, Left Axillary #3 - THERE IS NO EVIDENCE OF CARCINOMA IN 1 OF 1 LYMPH NODE (0/1). Microscopic Comment 1. BREAST, INVASIVE TUMOR, WITH LYMPH NODES PRESENT Specimen, including laterality and lymph node sampling (sentinel,  non-sentinel): Left breast and left axillary lymph nodes. Procedure: Lumpectomy and left axillary lymph node resection x 3. Histologic type: Ductal. Grade: I. Tubule formation: 2. Nuclear pleomorphism: 2. Mitotic: 1. Tumor size (gross measurement): 1.2 cm. Margins: Greater than 0.2 cm to all margins. Lymphovascular invasion: Not identified. Ductal carcinoma in situ: Present. Grade: Low grade. Extensive intraductal component: Not identified. Lobular neoplasia: Not identified. Tumor focality: Unifocal. Treatment effect: N/A. 1 of 3 FINAL for [facility], [place] E ([id number]) Microscopic Comment(continued) Extent of tumor: Confined to breast parenchyma. Lymph nodes: Examined: 3 Sentinel 0 Non-sentinel 3 Total Lymph nodes with metastasis: 0. Breast prognostic profile: [phone]. Estrogen receptor: 100%, strong staining intensity. Progesterone receptor: 70%, strong staining intensity. Her 2 neu: No amplification was detected. The ratio was 1.06. Ki-67: 15%. Non-neoplastic breast: No significant findings. TNM: pT1c, pN0. (JBK:ds 04/27/16)  ONCOTYPE DX RS 17, predicts 10 year distant recurrence risk of 11% with tamoxifen   RADIOGRAPHIC STUDIES: I have personally reviewed the radiological images as listed and agreed with the findings in the report.  Diagnostic mammogram from [facility] on 04/01/2017 IMPRESSION [place] There is no mammpgraphic evidence of mailignacny. A follow-up mammoram in 12 months is recommended.   Diagnostic mammogram and ultrasound of the left breast 04/02/2016 Impression: Suspicious of malignancy The 1 cm oval mass in the left breast is suspicious of malignancy, in the left upper outer quadrant posterior depth. Breast composition category C  DEXA 08/27/2016   ASSESSMENT & PLAN:  56 y.o. Caucasian post menopause female, with palpable  left breast mass, which was not detected by screening mammogram 3 months ago.  1. Breast cancer of lower-outer quadrant  of left female breast, invasive and in situ ductal carcinoma, grade 1, pT1cN0M0, stage IA, ER 100% positive, PR 70% positive, HER-2 negative, RS 17 -I previoulsy reviewed her surgical pathology findings with her and her husband in details, she had early stage breast cancer, was resected completely. --I discussed the Oncotype Dx result was reviewed with her in details. She has low risk based on the recurrence score, which predicts 10 year distant recurrence after 5 years of tamoxifen 11%. She does not need adjuvant chemotherapy -She is on adjuvant letrozole, tolerating well so far, plan for total of 5 years  -She does have joint stiffness and mild arthralgia, related to letrozole. This is manageable. We discussed switching letrozole to alternative antiestrogen therapy, such as anastrozole or tamoxifen, if her arthralgia gets worse. -We'll continue breast cancer surveillance, with annual screening mammogram in routine follow-up with labs and exams. -Pt previously asked if she can have breast US to screen for cancer in the breast in additional to mammogram. I explained that [place] is not standard tool for cancer screening, but will be used if she has abnormal mammogram. Will continue annual diagnostic mammogram for screening along with regular breast exam and home self breast exam -She is tolerating letrozole well overall. We discussed at length the benefits of letrozole for 5 years and small benefit of extended AI beyond that. Given her early stage disease, I do not think she needs more than 5 years.   -She is clinically doing well. Her CBC and CMP are within normal limits. Her Breast Exam was unremarkable. Her [facility] mammogram from 03/2017 was unremarkable. There is no clinical concern for reoccurrence.  -Labs reviewed and adequate to continue with letrozole. -F/u every 6 months    2. Osteopenia  -She had repeated DEXA scan in 08/2016, which showed osteopenia, with T score -2.4 at AP lumbar spine, no high  risk features for fracture. -We discussed her that May increase her bone loss -I strongly encouraged her to continue calcium and vitamin D, and weight Exercises -She is very close to osteoporosis, she may likely need biphosphonate in the near future. -We previously discussed that if her osteopenia gets worse, I may switch her letrozole to tamoxifen -She is not consistently calcium citrate and D3 -We discussed the amount of calcium and Vitamin D that she should be taking due to her close level to osteoporosis.  -She will continue calcium vitamin D.    3. Joint Pain/Stiffness  -secondary to letrozole -She is concerned that this will not fully resolve after ended letrozole  -We discussed the other options of and S2 therapy such as tamoxifen, if her stiffness and arthralgia gets worse.  -Much improved now, will continue with Letrozole    Plan -Continue letrozole  -labs and f/u in 6 months  -[facility] June [facility]   All questions were answered. The patient knows to call the clinic with any problems, questions or concerns. I spent 15 minutes counseling the patient face to face. The total time spent in the appointment was 20 minutes and more than 50% was on counseling.  This document serves as a record of services personally performed by Truitt Merle, MD. It was created on her behalf by Joslyn Devon, a trained medical scribe. The creation of this record is based on the scribe's personal observations and the provider's statements to them. This document  has been checked and approved by the attending provider.     Truitt Merle, MD 07/02/2017

## 2017-07-03 ENCOUNTER — Encounter: Payer: Self-pay | Admitting: Hematology

## 2017-07-22 ENCOUNTER — Telehealth: Payer: Self-pay | Admitting: *Deleted

## 2017-07-22 NOTE — Telephone Encounter (Signed)
-----   Message from Megan Salon, MD sent at 07/22/2017 12:03 PM EDT ----- Regarding: tdap Please let pt know I did get a copy of her last Tdap vaccination from [facility].  It was 12/14/11 so she is good.  Thanks.  Vinnie Level

## 2017-07-22 NOTE — Telephone Encounter (Signed)
Detailed message left per DPR with information as seen below from Dr. Sabra Heck. Advised patient to return call with any questions.   Encounter closed.

## 2017-10-14 DIAGNOSIS — M9901 Segmental and somatic dysfunction of cervical region: Secondary | ICD-10-CM | POA: Diagnosis not present

## 2017-10-14 DIAGNOSIS — M9902 Segmental and somatic dysfunction of thoracic region: Secondary | ICD-10-CM | POA: Diagnosis not present

## 2017-10-14 DIAGNOSIS — M7542 Impingement syndrome of left shoulder: Secondary | ICD-10-CM | POA: Diagnosis not present

## 2017-10-19 DIAGNOSIS — M7542 Impingement syndrome of left shoulder: Secondary | ICD-10-CM | POA: Diagnosis not present

## 2017-10-19 DIAGNOSIS — M9902 Segmental and somatic dysfunction of thoracic region: Secondary | ICD-10-CM | POA: Diagnosis not present

## 2017-10-19 DIAGNOSIS — M9901 Segmental and somatic dysfunction of cervical region: Secondary | ICD-10-CM | POA: Diagnosis not present

## 2017-10-25 DIAGNOSIS — M9901 Segmental and somatic dysfunction of cervical region: Secondary | ICD-10-CM | POA: Diagnosis not present

## 2017-10-25 DIAGNOSIS — M7542 Impingement syndrome of left shoulder: Secondary | ICD-10-CM | POA: Diagnosis not present

## 2017-10-25 DIAGNOSIS — M9902 Segmental and somatic dysfunction of thoracic region: Secondary | ICD-10-CM | POA: Diagnosis not present

## 2017-11-02 DIAGNOSIS — M7542 Impingement syndrome of left shoulder: Secondary | ICD-10-CM | POA: Diagnosis not present

## 2017-11-02 DIAGNOSIS — M9901 Segmental and somatic dysfunction of cervical region: Secondary | ICD-10-CM | POA: Diagnosis not present

## 2017-11-02 DIAGNOSIS — M9902 Segmental and somatic dysfunction of thoracic region: Secondary | ICD-10-CM | POA: Diagnosis not present

## 2017-11-09 DIAGNOSIS — M7542 Impingement syndrome of left shoulder: Secondary | ICD-10-CM | POA: Diagnosis not present

## 2017-11-09 DIAGNOSIS — M9902 Segmental and somatic dysfunction of thoracic region: Secondary | ICD-10-CM | POA: Diagnosis not present

## 2017-11-09 DIAGNOSIS — M9901 Segmental and somatic dysfunction of cervical region: Secondary | ICD-10-CM | POA: Diagnosis not present

## 2017-11-15 DIAGNOSIS — M9901 Segmental and somatic dysfunction of cervical region: Secondary | ICD-10-CM | POA: Diagnosis not present

## 2017-11-15 DIAGNOSIS — M9902 Segmental and somatic dysfunction of thoracic region: Secondary | ICD-10-CM | POA: Diagnosis not present

## 2017-11-15 DIAGNOSIS — M7542 Impingement syndrome of left shoulder: Secondary | ICD-10-CM | POA: Diagnosis not present

## 2017-11-22 ENCOUNTER — Other Ambulatory Visit: Payer: Self-pay | Admitting: Chiropractic Medicine

## 2017-11-22 ENCOUNTER — Ambulatory Visit
Admission: RE | Admit: 2017-11-22 | Discharge: 2017-11-22 | Disposition: A | Discharge status: Home / Self Care | Payer: 59 | Source: Ambulatory Visit | Attending: Chiropractic Medicine | Admitting: Chiropractic Medicine

## 2017-11-22 DIAGNOSIS — M7542 Impingement syndrome of left shoulder: Secondary | ICD-10-CM | POA: Diagnosis not present

## 2017-11-22 DIAGNOSIS — M25512 Pain in left shoulder: Secondary | ICD-10-CM

## 2017-11-22 DIAGNOSIS — M542 Cervicalgia: Secondary | ICD-10-CM

## 2017-11-22 DIAGNOSIS — M25612 Stiffness of left shoulder, not elsewhere classified: Secondary | ICD-10-CM

## 2017-11-22 DIAGNOSIS — M9901 Segmental and somatic dysfunction of cervical region: Secondary | ICD-10-CM | POA: Diagnosis not present

## 2017-11-22 DIAGNOSIS — M9902 Segmental and somatic dysfunction of thoracic region: Secondary | ICD-10-CM | POA: Diagnosis not present

## 2017-11-23 DIAGNOSIS — C50912 Malignant neoplasm of unspecified site of left female breast: Secondary | ICD-10-CM | POA: Diagnosis not present

## 2017-11-30 DIAGNOSIS — Z853 Personal history of malignant neoplasm of breast: Secondary | ICD-10-CM | POA: Diagnosis not present

## 2017-11-30 DIAGNOSIS — N6321 Unspecified lump in the left breast, upper outer quadrant: Secondary | ICD-10-CM | POA: Diagnosis not present

## 2017-11-30 DIAGNOSIS — R922 Inconclusive mammogram: Secondary | ICD-10-CM | POA: Diagnosis not present

## 2017-12-01 DIAGNOSIS — M7542 Impingement syndrome of left shoulder: Secondary | ICD-10-CM | POA: Diagnosis not present

## 2017-12-01 DIAGNOSIS — M9901 Segmental and somatic dysfunction of cervical region: Secondary | ICD-10-CM | POA: Diagnosis not present

## 2017-12-01 DIAGNOSIS — M9902 Segmental and somatic dysfunction of thoracic region: Secondary | ICD-10-CM | POA: Diagnosis not present

## 2017-12-06 DIAGNOSIS — C50912 Malignant neoplasm of unspecified site of left female breast: Secondary | ICD-10-CM | POA: Diagnosis not present

## 2017-12-07 ENCOUNTER — Other Ambulatory Visit: Payer: Self-pay | Admitting: Radiology

## 2017-12-07 DIAGNOSIS — N6032 Fibrosclerosis of left breast: Secondary | ICD-10-CM | POA: Diagnosis not present

## 2017-12-08 DIAGNOSIS — M7542 Impingement syndrome of left shoulder: Secondary | ICD-10-CM | POA: Diagnosis not present

## 2017-12-08 DIAGNOSIS — M9901 Segmental and somatic dysfunction of cervical region: Secondary | ICD-10-CM | POA: Diagnosis not present

## 2017-12-08 DIAGNOSIS — M9902 Segmental and somatic dysfunction of thoracic region: Secondary | ICD-10-CM | POA: Diagnosis not present

## 2017-12-15 ENCOUNTER — Other Ambulatory Visit: Payer: Self-pay | Admitting: Chiropractic Medicine

## 2017-12-15 DIAGNOSIS — M25512 Pain in left shoulder: Principal | ICD-10-CM

## 2017-12-15 DIAGNOSIS — M7542 Impingement syndrome of left shoulder: Secondary | ICD-10-CM | POA: Diagnosis not present

## 2017-12-15 DIAGNOSIS — G8929 Other chronic pain: Secondary | ICD-10-CM

## 2017-12-15 DIAGNOSIS — M9901 Segmental and somatic dysfunction of cervical region: Secondary | ICD-10-CM | POA: Diagnosis not present

## 2017-12-15 DIAGNOSIS — M9902 Segmental and somatic dysfunction of thoracic region: Secondary | ICD-10-CM | POA: Diagnosis not present

## 2017-12-16 ENCOUNTER — Other Ambulatory Visit: Payer: Self-pay | Admitting: Hematology

## 2017-12-22 DIAGNOSIS — M7542 Impingement syndrome of left shoulder: Secondary | ICD-10-CM | POA: Diagnosis not present

## 2017-12-22 DIAGNOSIS — M9902 Segmental and somatic dysfunction of thoracic region: Secondary | ICD-10-CM | POA: Diagnosis not present

## 2017-12-22 DIAGNOSIS — M9901 Segmental and somatic dysfunction of cervical region: Secondary | ICD-10-CM | POA: Diagnosis not present

## 2017-12-29 NOTE — Progress Notes (Signed)
[name]  [phone] [phone]  Clinic Follow Up Note   Patient Care Team: Lujean Amel, MD as PCP - General (Family Medicine) Excell Seltzer, MD as Consulting Physician (General Surgery) Truitt Merle, MD as Consulting Physician (Hematology) Kyung Rudd, MD as Consulting Physician (Radiation Oncology) 12/31/2017  CHIEF COMPLAINTS:  Follow up left breast cancer   Oncology History   Breast cancer of upper-outer quadrant of left female breast [facility])   Staging form: Breast, AJCC 7th Edition   - Clinical stage from 04/15/2016: Stage IA (T1b, N0, M0) - Unsigned         Staging comments: Staged at breast conference on 7.5.17   - Pathologic stage from 04/22/2016: Stage IA (T1c, N0, cM0) - Unsigned        Breast cancer of upper-outer quadrant of left female breast ([place])   04/02/2016 Mammogram    1 cm oval massin the upper outer quadrant of left breast, suspicious for malignancy.       04/08/2016 Initial Diagnosis    Breast cancer of upper-outer quadrant of left female breast ([place])      04/08/2016 Initial Biopsy    Left breast core needle biopsy showed invasive ductal carcinoma and DCIS, grade 1-2.      04/08/2016 Receptors her2    Your 100% positive, strong staining, PR 70% positive, strong staining, HER-2 negative, Ki-67 15%      04/22/2016 Surgery    Left breast lumpectomy and SLN biopsy ([place])      04/22/2016 Pathology Results    Left breast lumpectomy showed invasive ductal carcinoma, grade 1, 1.2 cm, low-grade DCIS, surgical margins were negative, 3 sentinel lymph nodes negative.      04/22/2016 Oncotype testing    RS 17, low risk, predicts 10 year distant recurrence risk of 11% with tamoxifen      05/26/2016 - 07/13/2016 Radiation Therapy    Adjuvant breast radiation [facility]): Left Breast/ 50.4 Gy in 28 fx.  Boost/ 10 Gy in 5 fx      07/2016 -  Anti-estrogen oral therapy    Letrozole 2.5 mg daily. Planned duration of therapy:  5-10 years.       08/27/2016 Imaging    DEXA scan: Osteopenia. (T-score -2.4).       11/22/2017 Pathology Results    Diagnosis 11/22/17 Breast, left, needle core biopsy - DENSE STROMAL FIBROSIS, CONSISTENT WITH PRIOR SURGICAL PROCEDURE. - THERE IS NO EVIDENCE OF MALIGNANCY. - SEE COMMENT.       HISTORY OF PRESENTING ILLNESS:  Tiffany Nash 57 y.o. female is here because of Her newly diagnosed left breast cancer. She is accompanied by her husband to our multidisciplinary breast clinic today.  She had routine screening mammogram in March and 17 which was negative. On her routine follow-up with her primary care physician a few weeks ago, breast examination reviewed a left breast mass. No skin change or nipple discharge. She feels well, no pain or other complains. She underwent diagnostic mammogram and ultrasound on 04/02/2016, which showed a 1 cm oval mass in the upper outer quadrant of left breast, core needle biopsy showed invasive ductal carcinoma and DCIS, ER/PR positive, HER-2 negative.   She is married, lives with her husband, who had head and neck cancer and was treated in our cancer center 5-6 years ago. She has no family history of breast cancer or other malignancy. 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   GYN HISTORY  Menarchal: 13 LMP: 2014 Contraceptive:  6 years  HRT: no G7P4: she did breast feeding for 6 weeks for each children, ago of 28-19, 2 daughters   CURRENT THERAPY:  Letrozole 2.5 mg daily started in 07/2016  INTERIM HISTORY:  Tiffany Nash returns for follow-up. She presents to the clinic today accompanied by her husband. She reports she is doing well overall. She reports that 6 weeks ago she felt a lump in her breast and had a [place] and biopsy done to ease her mind. Biopsy was negative and she has fibrosis tissue.   She is compliant with Letrozole and reports mild joint pain. She states she hasn't been exercising and she feels that if she becomes more  active she will have less pain. She notes to have insomnia and trouble staying asleep.   On review of systems, pt denies any other complaints at this time. Pertinent positives are listed and detailed within the above HPI.   MEDICAL HISTORY:  Past Medical History:  Diagnosis Date  . Abnormal Pap smear   . Breast cancer ([place])   . Elevated hemoglobin A1c June 2017   5.9  . GERD (gastroesophageal reflux disease)   . Migraine    uses imitrex as needed  . Schatzki's ring    and hiatal hernia on EGD  . Shingles rash 07/2015    shingles on right rib cage   . TMJ (temporomandibular joint syndrome)   . Vertigo     SURGICAL HISTORY: Past Surgical History:  Procedure Laterality Date  . BREAST BIOPSY  1/12   fibrocystic changes   . BREAST LUMPECTOMY WITH RADIOACTIVE SEED AND SENTINEL LYMPH NODE BIOPSY Left 04/22/2016   Procedure: BREAST LUMPECTOMY WITH RADIOACTIVE SEED AND SENTINEL LYMPH NODE BIOPSY;  Surgeon: Excell Seltzer, MD;  Location: [facility];  Service: General;  Laterality: Left;  . DILATION AND CURETTAGE OF UTERUS  1991  . ESOPHAGEAL DILATION    . GYNECOLOGIC CRYOSURGERY  1980s  . LAPAROSCOPIC CHOLECYSTECTOMY  1992  . WISDOM TOOTH EXTRACTION      SOCIAL HISTORY: Social History   Socioeconomic History  . Marital status: Married    Spouse name: Not on file  . Number of children: Not on file  . Years of education: Not on file  . Highest education level: Not on file  Occupational History  . Not on file  Social Needs  . Financial resource strain: Not on file  . Food insecurity:    Worry: Not on file    Inability: Not on file  . Transportation needs:    Medical: Not on file    Non-medical: Not on file  Tobacco Use  . Smoking status: Never Smoker  . Smokeless tobacco: Never Used  Substance and Sexual Activity  . Alcohol use: Yes    Alcohol/week: 1.2 oz    Types: 2 Standard drinks or equivalent per week    Comment: weekends only   . Drug use: No    . Sexual activity: Yes    Partners: Male    Comment: husband vasectomy  Lifestyle  . Physical activity:    Days per week: Not on file    Minutes per session: Not on file  . Stress: Not on file  Relationships  . Social connections:    Talks on phone: Not on file    Gets together: Not on file    Attends religious service: Not on file    Active member of club or organization: Not on file    Attends meetings  of clubs or organizations: Not on file    Relationship status: Not on file  . Intimate partner violence:    Fear of current or ex partner: Not on file    Emotionally abused: Not on file    Physically abused: Not on file    Forced sexual activity: Not on file  Other Topics Concern  . Not on file  Social History Narrative  . Not on file   She is self employed, has a Air cabin crew for wedding etc   FAMILY HISTORY: Family History  Problem Relation Age of Onset  . Diabetes Father   . Goiter Mother   . Heart disease Unknown     ALLERGIES:  is allergic to penicillins and tylox [oxycodone-acetaminophen].  MEDICATIONS:  Current Outpatient Medications  Medication Sig Dispense Refill  . Acetaminophen (TYLENOL PO) Take 1 tablet by mouth as needed. Reported on 04/15/2016    . ASPIRIN PO Take 81 mg by mouth daily.    Marland Kitchen CALCIUM PO Take by mouth daily.    . Cholecalciferol (VITAMIN D PO) Take 2,000 Units by mouth every other day.     . famotidine (PEPCID) 20 MG tablet Take 1 tablet (20 mg total) by mouth as needed. 30 tablet 11  . ibuprofen (ADVIL,MOTRIN) 800 MG tablet Take 400 mg by mouth as needed for pain.     Marland Kitchen letrozole (FEMARA) 2.5 MG tablet TAKE 1 TABLET BY MOUTH DAILY 90 tablet 0  . Magnesium 100 MG CAPS Take 2 capsules by mouth daily.    Marland Kitchen MAGNESIUM PO Take by mouth daily.    . Misc Natural Products (TART CHERRY ADVANCED) CAPS Take by mouth daily.    . NON FORMULARY 2 tablets daily. Juice Plus Vegetables    . NON FORMULARY 2 tablets daily. Juice Plus Fruits    .  omeprazole (PRILOSEC) 20 MG capsule Take 20 mg by mouth 2 (two) times daily before a meal.    . polyethylene glycol (MIRALAX / GLYCOLAX) packet Take 17 g by mouth daily. Pt takes 1/2 dose daily.    . SUMAtriptan (IMITREX) 100 MG tablet Take 1 tablet (100 mg total) by mouth once. May repeat in 2 hours if headache persists or recurs. 9 tablet 6   No current facility-administered medications for this visit.     REVIEW OF SYSTEMS:   Constitutional: Denies fevers, chills or abnormal night sweats (+) insomnia Eyes: Denies blurriness of vision, double vision or watery eyes Ears, nose, mouth, throat, and face: Denies mucositis or sore throat Respiratory: Denies cough, dyspnea or wheezes Cardiovascular: Denies palpitation, chest discomfort or lower extremity swelling Gastrointestinal:  Denies nausea, heartburn or change in bowel habits Skin: Denies abnormal skin rashes Lymphatics: Denies new lymphadenopathy or easy bruising Neurological:Denies numbness, tingling or new weaknesses MSK: (+) Joint aches and stiffness mostly in feet, improved lately Behavioral/Psych: Mood is stable, no new changes  All other systems were reviewed with the patient and are negative.  PHYSICAL EXAMINATION: ECOG PERFORMANCE STATUS: 0 - Asymptomatic  Vitals:   12/31/17 1349  BP: 129/74  Pulse: 84  Resp: 18  Temp: 98.3 F (36.8 C)  SpO2: 100%   Filed Weights   12/31/17 1349  Weight: 148 lb 4.8 oz (67.3 kg)    GENERAL:alert, no distress and comfortable SKIN: skin color, texture, turgor are normal, no rashes or significant lesions EYES: normal, conjunctiva are pink and non-injected, sclera clear OROPHARYNX:no exudate, no erythema and lips, buccal mucosa, and tongue normal  NECK: supple, thyroid  normal size, non-tender, without nodularity LYMPH:  no palpable lymphadenopathy in the cervical, axillary or inguinal LUNGS: clear to auscultation and percussion with normal breathing effort HEART: regular rate &  rhythm and no murmurs and no lower extremity edema ABDOMEN:abdomen soft, non-tender and normal bowel sounds Musculoskeletal:no cyanosis of digits and no clubbing    PSYCH: alert & oriented x 3 with fluent speech NEURO: no focal motor/sensory deficits Breasts: Breast inspection showed them to be symmetrical with no nipple discharge. Palpitation of both breasts and both axillas revealed no obvious mass that I could appreciate. (+) mild scar tissue next to axilla incision with a very small nodule that was biopsied with negative results last month   LABORATORY DATA:  I have reviewed the data as listed CBC Latest Ref Rng & Units 12/31/2017 07/01/2017 02/26/2017  WBC 3.9 - 10.3 K/uL 4.1 5.5 6.8  Hemoglobin 11.6 - 15.9 g/dL 12.7 12.9 13.1  Hematocrit 34.8 - 46.6 % 37.5 38.0 38.8  Platelets 145 - 400 K/uL 277 320 308   CMP Latest Ref Rng & Units 07/01/2017 02/26/2017 10/26/2016  Glucose 65 - 99 mg/dL 83 110 115  BUN 6 - 24 mg/dL 14 19.0 12.5  Creatinine 0.57 - 1.00 mg/dL 0.77 0.8 0.7  Sodium 134 - 144 mmol/L 144 139 142  Potassium 3.5 - 5.2 mmol/L 4.4 4.0 4.1  Chloride 96 - 106 mmol/L 101 - -  CO2 20 - 29 mmol/L _0 Calcium 8.7 - 10.2 mg/dL 10.0 10.1 9.9  Total Protein 6.0 - 8.5 g/dL 7.4 7.4 7.4  Total Bilirubin 0.0 - 1.2 mg/dL 0.3 0.32 0.30  Alkaline Phos 39 - 117 IU/L 67 73 77  AST 0 - 40 IU/L _1 ALT 0 - 32 IU/L 21 21 42   PATHOLOGY REPORT  Diagnosis 11/22/17 Breast, left, needle core biopsy - DENSE STROMAL FIBROSIS, CONSISTENT WITH PRIOR SURGICAL PROCEDURE. - THERE IS NO EVIDENCE OF MALIGNANCY. - SEE COMMENT.  Diagnosis 04/08/2016 Breast, left, needle core biopsy - INVASIVE DUCTAL CARCINOMA. - DUCTAL CARCINOMA IN SITU. - SEE COMMENT. Microscopic Comment Although definitive grading of breast carcinoma is best done on excision, the features of the invasive tumor from the left breast needle core biopsy are compatible with a grade 1-2 breast carcinoma. Breast prognostic  markers will be performed and reported in an addendum. Findings are called to [facility] on 04/09/2016. Dr. Vicente Males has seen this case in consultation with agreement. (RH:ecj 04/09/2016)   Results: IMMUNOHISTOCHEMICAL AND MORPHOMETRIC ANALYSIS PERFORMED MANUALLY Estrogen Receptor: 100%, POSITIVE, STRONG STAINING INTENSITY Progesterone Receptor: 70%, POSITIVE, STRONG STAINING INTENSITY Proliferation Marker Ki67: 15%  Results: HER2 - NEGATIVE RATIO OF HER2/CEP17 SIGNALS 1.06 AVERAGE HER2 COPY NUMBER PER CELL 1.85  Diagnosis 04/22/2016 1. Breast, lumpectomy, Left - INVASIVE DUCTAL CARCINOMA, GRADE I/III, SPANNING 1.2 CM. - DUCTAL CARCINOMA IN SITU, LOW GRADE. - THE SURGICAL RESECTION MARGINS ARE NEGATIVE FOR CARCINOMA. - SEE ONCOLOGY TABLE BELOW. 2. Lymph node, sentinel, biopsy, Left Axillary #1 - THERE IS NO EVIDENCE OF CARCINOMA IN 1 OF 1 LYMPH NODE (0/1). 3. Lymph node, sentinel, biopsy, Left Axillary #2 - THERE IS NO EVIDENCE OF CARCINOMA IN 1 OF 1 LYMPH NODE (0/1). 4. Lymph node, sentinel, biopsy, Left Axillary #3 - THERE IS NO EVIDENCE OF CARCINOMA IN 1 OF 1 LYMPH NODE (0/1). Microscopic Comment 1. BREAST, INVASIVE TUMOR, WITH LYMPH NODES PRESENT Specimen, including laterality and lymph node sampling (sentinel, non-sentinel): Left breast and  left axillary lymph nodes. Procedure: Lumpectomy and left axillary lymph node resection x 3. Histologic type: Ductal. Grade: I. Tubule formation: 2. Nuclear pleomorphism: 2. Mitotic: 1. Tumor size (gross measurement): 1.2 cm. Margins: Greater than 0.2 cm to all margins. Lymphovascular invasion: Not identified. Ductal carcinoma in situ: Present. Grade: Low grade. Extensive intraductal component: Not identified. Lobular neoplasia: Not identified. Tumor focality: Unifocal. Treatment effect: N/A. 1 of 3 FINAL for [facility], Sennie E ([id number]) Microscopic Comment(continued) Extent  of tumor: Confined to breast parenchyma. Lymph nodes: Examined: 3 Sentinel 0 Non-sentinel 3 Total Lymph nodes with metastasis: 0. Breast prognostic profile: [phone]. Estrogen receptor: 100%, strong staining intensity. Progesterone receptor: 70%, strong staining intensity. Her 2 neu: No amplification was detected. The ratio was 1.06. Ki-67: 15%. Non-neoplastic breast: No significant findings. TNM: pT1c, pN0. (JBK:ds 04/27/16)  ONCOTYPE DX RS 17, predicts 10 year distant recurrence risk of 11% with tamoxifen   RADIOGRAPHIC STUDIES: I have personally reviewed the radiological images as listed and agreed with the findings in the report.  Diagnostic mammogram from [facility] on 04/01/2017 IMPRESSION Beniegn There is no mammpgraphic evidence of mailignacny. A follow-up mammoram in 12 months is recommended.   Diagnostic mammogram and ultrasound of the left breast 04/02/2016 Impression: Suspicious of malignancy The 1 cm oval mass in the left breast is suspicious of malignancy, in the left upper outer quadrant posterior depth. Breast composition category C  DEXA 08/27/2016   ASSESSMENT & PLAN:  57 y.o. Caucasian post menopause female, with palpable left breast mass, which was not detected by screening mammogram 3 months ago.  1. Breast cancer of lower-outer quadrant of left female breast, invasive and in situ ductal carcinoma, grade 1, pT1cN0M0, stage IA, ER 100% positive, PR 70% positive, HER-2 negative, RS 17 -I previoulsy reviewed her surgical pathology findings with her and her husband in details, she had early stage breast cancer, was resected completely. --I previously discussed the Oncotype Dx result was reviewed with her in details. She has low risk based on the recurrence score, which predicts 10 year distant recurrence after 5 years of tamoxifen 11%. She does not need adjuvant chemotherapy -She is on adjuvant letrozole, tolerating well so far, plan for total of 5 years  -She  does have joint stiffness and mild arthralgia, related to letrozole. This is manageable. We discussed switching letrozole to alternative antiestrogen therapy, such as anastrozole or tamoxifen, if her arthralgia gets worse. -We'll continue breast cancer surveillance, with annual screening mammogram in routine follow-up with labs and exams. --She had a [place] and biopsy of an small nodule at her incision site in Feb 2019, Biopsy revealed fibrosis, negative for malignancy.  -She is clinically doing well. Labs reviewed, CBC otherwise normal, CMP I spending. Her Breast Exam was unremarkable other than scar tissue near axilla incision and a tiny nodule (biopsied 11/22/16). Her [facility] mammogram from 03/2017 was unremarkable. There is no clinical concern for reoccurrence.  -Labs reviewed and adequate to continue with letrozole. -Next mammogram in June 2019 -F/u every 6 months    2. Osteopenia  -She had repeated DEXA scan in 08/2016, which showed osteopenia, with T score -2.4 at AP lumbar spine, no high risk features for fracture. -We discussed her aromatase inhibitor may increase her bone loss -I strongly encouraged her to continue calcium and vitamin D, and weight exercises -She is very close to osteoporosis, she may likely need biphosphonate in the near future. -We previously discussed that if her osteopenia gets worse, I may switch her letrozole to  tamoxifen -She is not consistently taking calcium citrate and D3 -We again discussed the amount of calcium and Vitamin D that she should be taking due to her close level to osteoporosis.  -She will continue calcium vitamin D.    3. Joint Pain/Stiffness  -secondary to letrozole -She is concerned that this will not fully resolve after ended letrozole  -We previously discussed the other options of and S2 therapy such as tamoxifen, if her stiffness and arthralgia gets worse.  -Much improved now, will continue with Letrozole    Plan -Continue letrozole  -labs  and f/u in 6 months  -Mammogram in June 2019 at [facility]   All questions were answered. The patient knows to call the clinic with any problems, questions or concerns. I spent 15 minutes counseling the patient face to face. The total time spent in the appointment was 20 minutes and more than 50% was on counseling.  This document serves as a record of services personally performed by Truitt Merle, MD. It was created on her behalf by Theresia Bough, a trained medical scribe. The creation of this record is based on the scribe's personal observations and the provider's statements to them.   I have reviewed the above documentation for accuracy and completeness, and I agree with the above.    Truitt Merle, MD 12/31/2017 2:19 PM

## 2017-12-31 ENCOUNTER — Inpatient Hospital Stay: Payer: 59

## 2017-12-31 ENCOUNTER — Telehealth: Payer: Self-pay

## 2017-12-31 ENCOUNTER — Encounter: Payer: Self-pay | Admitting: Hematology

## 2017-12-31 ENCOUNTER — Inpatient Hospital Stay: Payer: 59 | Attending: Hematology | Admitting: Hematology

## 2017-12-31 VITALS — BP 129/74 | HR 84 | Temp 98.3°F | Resp 18 | Ht 65.5 in | Wt 148.3 lb

## 2017-12-31 DIAGNOSIS — Z170 Estrogen receptor positive status [ER+]: Secondary | ICD-10-CM

## 2017-12-31 DIAGNOSIS — C50412 Malignant neoplasm of upper-outer quadrant of left female breast: Secondary | ICD-10-CM

## 2017-12-31 DIAGNOSIS — C50512 Malignant neoplasm of lower-outer quadrant of left female breast: Secondary | ICD-10-CM | POA: Diagnosis not present

## 2017-12-31 DIAGNOSIS — Z79811 Long term (current) use of aromatase inhibitors: Secondary | ICD-10-CM

## 2017-12-31 DIAGNOSIS — M2550 Pain in unspecified joint: Secondary | ICD-10-CM | POA: Diagnosis not present

## 2017-12-31 DIAGNOSIS — Z79899 Other long term (current) drug therapy: Secondary | ICD-10-CM | POA: Diagnosis not present

## 2017-12-31 DIAGNOSIS — M8580 Other specified disorders of bone density and structure, unspecified site: Secondary | ICD-10-CM

## 2017-12-31 LAB — CBC WITH DIFFERENTIAL/PLATELET
BASOS ABS: 0 10*3/uL (ref 0.0–0.1)
BASOS PCT: 1 %
EOS ABS: 0.1 10*3/uL (ref 0.0–0.5)
Eosinophils Relative: 2 %
HEMATOCRIT: 37.5 % (ref 34.8–46.6)
HEMOGLOBIN: 12.7 g/dL (ref 11.6–15.9)
Lymphocytes Relative: 12 %
Lymphs Abs: 0.5 10*3/uL — ABNORMAL LOW (ref 0.9–3.3)
MCH: 31.7 pg (ref 25.1–34.0)
MCHC: 33.8 g/dL (ref 31.5–36.0)
MCV: 94 fL (ref 79.5–101.0)
Monocytes Absolute: 0.6 10*3/uL (ref 0.1–0.9)
Monocytes Relative: 13 %
NEUTROS ABS: 3 10*3/uL (ref 1.5–6.5)
NEUTROS PCT: 72 %
Platelets: 277 10*3/uL (ref 145–400)
RBC: 3.99 MIL/uL (ref 3.70–5.45)
RDW: 12.8 % (ref 11.2–14.5)
WBC: 4.1 10*3/uL (ref 3.9–10.3)

## 2017-12-31 LAB — COMPREHENSIVE METABOLIC PANEL
ALK PHOS: 88 U/L (ref 40–150)
ALT: 20 U/L (ref 0–55)
ANION GAP: 10 (ref 3–11)
AST: 21 U/L (ref 5–34)
Albumin: 4.3 g/dL (ref 3.5–5.0)
BILIRUBIN TOTAL: 0.3 mg/dL (ref 0.2–1.2)
BUN: 10 mg/dL (ref 7–26)
CALCIUM: 9.6 mg/dL (ref 8.4–10.4)
CO2: 25 mmol/L (ref 22–29)
CREATININE: 0.8 mg/dL (ref 0.60–1.10)
Chloride: 102 mmol/L (ref 98–109)
Glucose, Bld: 105 mg/dL (ref 70–140)
Potassium: 4 mmol/L (ref 3.5–5.1)
SODIUM: 137 mmol/L (ref 136–145)
TOTAL PROTEIN: 7.4 g/dL (ref 6.4–8.3)

## 2017-12-31 NOTE — Addendum Note (Signed)
Addended by: Elray Buba LE on: 12/31/2017 06:44 PM   Modules accepted: Orders

## 2017-12-31 NOTE — Telephone Encounter (Signed)
Printed avs and calender of upcoming appointment. Per 3/22 los

## 2018-01-04 ENCOUNTER — Telehealth: Payer: Self-pay | Admitting: *Deleted

## 2018-01-04 ENCOUNTER — Ambulatory Visit
Admission: RE | Admit: 2018-01-04 | Discharge: 2018-01-04 | Disposition: A | Discharge status: Home / Self Care | Payer: 59 | Source: Ambulatory Visit | Attending: Chiropractic Medicine | Admitting: Chiropractic Medicine

## 2018-01-04 DIAGNOSIS — G8929 Other chronic pain: Secondary | ICD-10-CM

## 2018-01-04 DIAGNOSIS — S46012A Strain of muscle(s) and tendon(s) of the rotator cuff of left shoulder, initial encounter: Secondary | ICD-10-CM | POA: Diagnosis not present

## 2018-01-04 DIAGNOSIS — M25512 Pain in left shoulder: Principal | ICD-10-CM

## 2018-01-04 NOTE — Telephone Encounter (Signed)
Left message on pt's identified vm to call back for good lab report.

## 2018-01-04 NOTE — Telephone Encounter (Signed)
Pt returned call & informed that labs from 12/31/17 were normal.  She expressed appreciation for call.

## 2018-01-04 NOTE — Telephone Encounter (Signed)
-----   Message from Truitt Merle, MD sent at 12/31/2017  2:32 PM EDT ----- Please let her know the CMP was normal today, thanks  Truitt Merle  12/31/2017

## 2018-02-02 DIAGNOSIS — M7502 Adhesive capsulitis of left shoulder: Secondary | ICD-10-CM | POA: Diagnosis not present

## 2018-02-11 DIAGNOSIS — M7502 Adhesive capsulitis of left shoulder: Secondary | ICD-10-CM | POA: Diagnosis not present

## 2018-02-11 DIAGNOSIS — M25512 Pain in left shoulder: Secondary | ICD-10-CM | POA: Diagnosis not present

## 2018-02-15 DIAGNOSIS — M25512 Pain in left shoulder: Secondary | ICD-10-CM | POA: Diagnosis not present

## 2018-02-17 DIAGNOSIS — M25512 Pain in left shoulder: Secondary | ICD-10-CM | POA: Diagnosis not present

## 2018-03-01 DIAGNOSIS — M25512 Pain in left shoulder: Secondary | ICD-10-CM | POA: Diagnosis not present

## 2018-03-03 DIAGNOSIS — M25512 Pain in left shoulder: Secondary | ICD-10-CM | POA: Diagnosis not present

## 2018-03-08 DIAGNOSIS — M25512 Pain in left shoulder: Secondary | ICD-10-CM | POA: Diagnosis not present

## 2018-03-09 DIAGNOSIS — M7502 Adhesive capsulitis of left shoulder: Secondary | ICD-10-CM | POA: Diagnosis not present

## 2018-03-10 DIAGNOSIS — M25512 Pain in left shoulder: Secondary | ICD-10-CM | POA: Diagnosis not present

## 2018-03-14 ENCOUNTER — Other Ambulatory Visit: Payer: Self-pay | Admitting: Hematology

## 2018-07-06 ENCOUNTER — Inpatient Hospital Stay: Payer: 59 | Admitting: Hematology

## 2018-07-06 ENCOUNTER — Inpatient Hospital Stay: Payer: 59

## 2018-07-06 DIAGNOSIS — R922 Inconclusive mammogram: Secondary | ICD-10-CM | POA: Diagnosis not present

## 2018-07-08 ENCOUNTER — Telehealth: Payer: Self-pay

## 2018-07-08 NOTE — Telephone Encounter (Signed)
Faxed signed order to [facility]

## 2018-07-13 ENCOUNTER — Telehealth: Payer: Self-pay | Admitting: Hematology

## 2018-07-13 NOTE — Telephone Encounter (Signed)
Returned pts call to r/s appts   °

## 2018-07-14 ENCOUNTER — Other Ambulatory Visit: Payer: Self-pay | Admitting: Nurse Practitioner

## 2018-07-20 NOTE — Progress Notes (Signed)
[place]  [phone]) [phone] [phone]  Clinic Follow Up Note   Patient Care Team: Lujean Amel, MD as PCP - General (Family Medicine) Excell Seltzer, MD as Consulting Physician (General Surgery) Truitt Merle, MD as Consulting Physician (Hematology) Kyung Rudd, MD as Consulting Physician (Radiation Oncology)   Date of Service:  07/22/2018  CHIEF COMPLAINTS:  Follow up left breast cancer   Oncology History   Breast cancer of upper-outer quadrant of left female breast [facility])   Staging form: Breast, AJCC 7th Edition   - Clinical stage from 04/15/2016: Stage IA (T1b, N0, M0) - Unsigned         Staging comments: Staged at breast conference on 7.5.17   - Pathologic stage from 04/22/2016: Stage IA (T1c, N0, cM0) - Unsigned        Breast cancer of upper-outer quadrant of left female breast ([place])   04/02/2016 Mammogram    1 cm oval massin the upper outer quadrant of left breast, suspicious for malignancy.     04/08/2016 Initial Diagnosis    Breast cancer of upper-outer quadrant of left female breast ([place])    04/08/2016 Initial Biopsy    Left breast core needle biopsy showed invasive ductal carcinoma and DCIS, grade 1-2.    04/08/2016 Receptors her2    Your 100% positive, strong staining, PR 70% positive, strong staining, HER-2 negative, Ki-67 15%    04/22/2016 Surgery    Left breast lumpectomy and SLN biopsy ([place])    04/22/2016 Pathology Results    Left breast lumpectomy showed invasive ductal carcinoma, grade 1, 1.2 cm, low-grade DCIS, surgical margins were negative, 3 sentinel lymph nodes negative.    04/22/2016 Oncotype testing    RS 17, low risk, predicts 10 year distant recurrence risk of 11% with tamoxifen    05/26/2016 - 07/13/2016 Radiation Therapy    Adjuvant breast radiation [facility]): Left Breast/ 50.4 Gy in 28 fx.  Boost/ 10 Gy in 5 fx    07/2016 -  Anti-estrogen oral therapy    Letrozole 2.5 mg daily. Planned duration of therapy:  5-10 years.     08/27/2016 Imaging    DEXA scan: Osteopenia. (T-score -2.4).     11/22/2017 Pathology Results    Diagnosis 11/22/17 Breast, left, needle core biopsy - DENSE STROMAL FIBROSIS, CONSISTENT WITH PRIOR SURGICAL PROCEDURE. - THERE IS NO EVIDENCE OF MALIGNANCY. - SEE COMMENT.     HISTORY OF PRESENTING ILLNESS:  Tiffany Nash 57 y.o. female is here because of Her newly diagnosed left breast cancer. She is accompanied by her husband to our multidisciplinary breast clinic today.  She had routine screening mammogram in March and 17 which was negative. On her routine follow-up with her primary care physician a few weeks ago, breast examination reviewed a left breast mass. No skin change or nipple discharge. She feels well, no pain or other complains. She underwent diagnostic mammogram and ultrasound on 04/02/2016, which showed a 1 cm oval mass in the upper outer quadrant of left breast, core needle biopsy showed invasive ductal carcinoma and DCIS, ER/PR positive, HER-2 negative.   She is married, lives with her husband, who had head and neck cancer and was treated in our cancer center 5-6 years ago. She has no family history of breast cancer or other malignancy. 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   GYN HISTORY  Menarchal: 13 LMP: 2014 Contraceptive: 6 years  HRT: no G7P4: she did breast feeding for 6 weeks for each children,  ago of 28-19, 2 daughters   CURRENT THERAPY:  Letrozole 2.5 mg daily started in 07/2016  INTERIM HISTORY:  Tiffany Nash returns for follow-up for left breast cancer. She was last seen by me 7 months ago. She presents to the clinic today accompanied by her husband. She is doing well. She notes no new changes recently.   She notes she is taking magnesium consistently and exercising more so her joints feel better. She also takes Vitamin D. She plans to take calcium. She is taking Letrozole and experiences no side effects.  She notes she has  trouble sleeping with hard time falling a sleep and waking up in the middle of the night. She has tried Benadryl and Melatonin.  She notes tightness in her left shoulder post surgery.  She does not see her PCP that often. She does see her surgeon Dr. Excell Seltzer every 6 months.      MEDICAL HISTORY:  Past Medical History:  Diagnosis Date  . Abnormal Pap smear   . Breast cancer ([place])   . Elevated hemoglobin A1c June 2017   5.9  . GERD (gastroesophageal reflux disease)   . Migraine    uses imitrex as needed  . Schatzki's ring    and hiatal hernia on EGD  . Shingles rash 07/2015    shingles on right rib cage   . TMJ (temporomandibular joint syndrome)   . Vertigo     SURGICAL HISTORY: Past Surgical History:  Procedure Laterality Date  . BREAST BIOPSY  1/12   fibrocystic changes   . BREAST LUMPECTOMY WITH RADIOACTIVE SEED AND SENTINEL LYMPH NODE BIOPSY Left 04/22/2016   Procedure: BREAST LUMPECTOMY WITH RADIOACTIVE SEED AND SENTINEL LYMPH NODE BIOPSY;  Surgeon: Excell Seltzer, MD;  Location: [place];  Service: General;  Laterality: Left;  . DILATION AND CURETTAGE OF UTERUS  1991  . ESOPHAGEAL DILATION    . GYNECOLOGIC CRYOSURGERY  1980s  . LAPAROSCOPIC CHOLECYSTECTOMY  1992  . WISDOM TOOTH EXTRACTION      SOCIAL HISTORY: Social History   Socioeconomic History  . Marital status: Married    Spouse name: Not on file  . Number of children: Not on file  . Years of education: Not on file  . Highest education level: Not on file  Occupational History  . Not on file  Social Needs  . Financial resource strain: Not on file  . Food insecurity:    Worry: Not on file    Inability: Not on file  . Transportation needs:    Medical: Not on file    Non-medical: Not on file  Tobacco Use  . Smoking status: Never Smoker  . Smokeless tobacco: Never Used  Substance and Sexual Activity  . Alcohol use: Yes    Alcohol/week: 2.0 standard drinks    Types: 2 Standard  drinks or equivalent per week    Comment: weekends only   . Drug use: No  . Sexual activity: Yes    Partners: Male    Comment: husband vasectomy  Lifestyle  . Physical activity:    Days per week: Not on file    Minutes per session: Not on file  . Stress: Not on file  Relationships  . Social connections:    Talks on phone: Not on file    Gets together: Not on file    Attends religious service: Not on file    Active member of club or organization: Not on file    Attends meetings of  clubs or organizations: Not on file    Relationship status: Not on file  . Intimate partner violence:    Fear of current or ex partner: Not on file    Emotionally abused: Not on file    Physically abused: Not on file    Forced sexual activity: Not on file  Other Topics Concern  . Not on file  Social History Narrative  . Not on file   She is self employed, has a Air cabin crew for wedding etc   FAMILY HISTORY: Family History  Problem Relation Age of Onset  . Diabetes Father   . Goiter Mother   . Heart disease Unknown     ALLERGIES:  is allergic to penicillins and tylox [oxycodone-acetaminophen].  MEDICATIONS:  Current Outpatient Medications  Medication Sig Dispense Refill  . Acetaminophen (TYLENOL PO) Take 1 tablet by mouth as needed. Reported on 04/15/2016    . Ascorbic Acid (VITAMIN C) 100 MG tablet Take 100 mg by mouth daily.    Marland Kitchen CALCIUM PO Take by mouth daily.    . Cholecalciferol (VITAMIN D PO) Take 2,000 Units by mouth every other day.     . ergocalciferol (VITAMIN D2) 50000 units capsule Take 50,000 Units by mouth once a week.    . famotidine (PEPCID) 20 MG tablet Take 1 tablet (20 mg total) by mouth as needed. 30 tablet 11  . ibuprofen (ADVIL,MOTRIN) 800 MG tablet Take 400 mg by mouth as needed for pain.     . Magnesium 100 MG CAPS Take 2 capsules by mouth daily.    . Misc Natural Products (TART CHERRY ADVANCED) CAPS Take by mouth daily.    Marland Kitchen omeprazole (PRILOSEC) 20 MG capsule  Take 20 mg by mouth 2 (two) times daily before a meal.    . ASPIRIN PO Take 81 mg by mouth daily.    Marland Kitchen letrozole (FEMARA) 2.5 MG tablet TAKE 1 TABLET BY MOUTH DAILY 90 tablet 0  . NON FORMULARY 2 tablets daily. Juice Plus Vegetables    . NON FORMULARY 2 tablets daily. Juice Plus Fruits    . polyethylene glycol (MIRALAX / GLYCOLAX) packet Take 17 g by mouth daily. Pt takes 1/2 dose daily.    . SUMAtriptan (IMITREX) 100 MG tablet Take 1 tablet (100 mg total) by mouth once. May repeat in 2 hours if headache persists or recurs. 9 tablet 6   No current facility-administered medications for this visit.     REVIEW OF SYSTEMS:   Constitutional: Denies fevers, chills or abnormal night sweats (+) insomnia Eyes: Denies blurriness of vision, double vision or watery eyes Ears, nose, mouth, throat, and face: Denies mucositis or sore throat Respiratory: Denies cough, dyspnea or wheezes Cardiovascular: Denies palpitation, chest discomfort or lower extremity swelling Gastrointestinal:  Denies nausea, heartburn or change in bowel habits Skin: Denies abnormal skin rashes Lymphatics: Denies new lymphadenopathy or easy bruising Neurological:Denies numbness, tingling or new weaknesses MSK: (+) Joint aches and stiffness, improved (+) tightness in left shoulder  Behavioral/Psych: Mood is stable, no new changes  All other systems were reviewed with the patient and are negative.  PHYSICAL EXAMINATION: ECOG PERFORMANCE STATUS: 0 - Asymptomatic  Vitals:   07/22/18 0814  BP: 126/81  Pulse: 79  Resp: 18  Temp: 98.2 F (36.8 C)  SpO2: 100%   Filed Weights   07/22/18 0814  Weight: 149 lb (67.6 kg)    GENERAL:alert, no distress and comfortable SKIN: skin color, texture, turgor are normal, no rashes or significant lesions  EYES: normal, conjunctiva are pink and non-injected, sclera clear OROPHARYNX:no exudate, no erythema and lips, buccal mucosa, and tongue normal  NECK: supple, thyroid normal size,  non-tender, without nodularity LYMPH:  no palpable lymphadenopathy in the cervical, axillary or inguinal LUNGS: clear to auscultation and percussion with normal breathing effort HEART: regular rate & rhythm and no murmurs and no lower extremity edema ABDOMEN:abdomen soft, non-tender and normal bowel sounds Musculoskeletal:no cyanosis of digits and no clubbing    PSYCH: alert & oriented x 3 with fluent speech NEURO: no focal motor/sensory deficits Breasts: Breast inspection showed them to be symmetrical with no nipple discharge. Palpitation of both breasts and both axillas revealed no obvious mass that I could appreciate. S/p Left lumpectomy: (+) Surgical scar healed well. (+) mild scar tissue    LABORATORY DATA:  I have reviewed the data as listed CBC Latest Ref Rng & Units 07/22/2018 12/31/2017 07/01/2017  WBC 4.0 - 10.5 K/uL 5.9 4.1 5.5  Hemoglobin 12.0 - 15.0 g/dL 12.8 12.7 12.9  Hematocrit 36.0 - 46.0 % 39.2 37.5 38.0  Platelets 150 - 400 K/uL 274 277 320   CMP Latest Ref Rng & Units 07/22/2018 12/31/2017 07/01/2017  Glucose 70 - 99 mg/dL 111(H) 105 83  BUN 6 - 20 mg/dL 15 10 14   Creatinine 0.44 - 1.00 mg/dL 0.84 0.80 0.77  Sodium 135 - 145 mmol/L 141 137 144  Potassium 3.5 - 5.1 mmol/L 4.1 4.0 4.4  Chloride 98 - 111 mmol/L 103 102 101  CO2 22 - 32 mmol/L 28 25 28   Calcium 8.9 - 10.3 mg/dL 10.0 9.6 10.0  Total Protein 6.5 - 8.1 g/dL 7.3 7.4 7.4  Total Bilirubin 0.3 - 1.2 mg/dL 0.5 0.3 0.3  Alkaline Phos 38 - 126 U/L 77 88 67  AST 15 - 41 U/L 19 21 26   ALT 0 - 44 U/L 21 20 21    PATHOLOGY REPORT  Diagnosis 11/22/17 Breast, left, needle core biopsy - DENSE STROMAL FIBROSIS, CONSISTENT WITH PRIOR SURGICAL PROCEDURE. - THERE IS NO EVIDENCE OF MALIGNANCY. - SEE COMMENT.  Diagnosis 04/08/2016 Breast, left, needle core biopsy - INVASIVE DUCTAL CARCINOMA. - DUCTAL CARCINOMA IN SITU. - SEE COMMENT. Microscopic Comment Although definitive grading of breast carcinoma is best done on  excision, the features of the invasive tumor from the left breast needle core biopsy are compatible with a grade 1-2 breast carcinoma. Breast prognostic markers will be performed and reported in an addendum. Findings are called to [place] ([place]) on 04/09/2016. Dr. Vicente Males has seen this case in consultation with agreement. (RH:ecj 04/09/2016)   Results: IMMUNOHISTOCHEMICAL AND MORPHOMETRIC ANALYSIS PERFORMED MANUALLY Estrogen Receptor: 100%, POSITIVE, STRONG STAINING INTENSITY Progesterone Receptor: 70%, POSITIVE, STRONG STAINING INTENSITY Proliferation Marker Ki67: 15%  Results: HER2 - NEGATIVE RATIO OF HER2/CEP17 SIGNALS 1.06 AVERAGE HER2 COPY NUMBER PER CELL 1.85  Diagnosis 04/22/2016 1. Breast, lumpectomy, Left - INVASIVE DUCTAL CARCINOMA, GRADE I/III, SPANNING 1.2 CM. - DUCTAL CARCINOMA IN SITU, LOW GRADE. - THE SURGICAL RESECTION MARGINS ARE NEGATIVE FOR CARCINOMA. - SEE ONCOLOGY TABLE BELOW. 2. Lymph node, sentinel, biopsy, Left Axillary #1 - THERE IS NO EVIDENCE OF CARCINOMA IN 1 OF 1 LYMPH NODE (0/1). 3. Lymph node, sentinel, biopsy, Left Axillary #2 - THERE IS NO EVIDENCE OF CARCINOMA IN 1 OF 1 LYMPH NODE (0/1). 4. Lymph node, sentinel, biopsy, Left Axillary #3 - THERE IS NO EVIDENCE OF CARCINOMA IN 1 OF 1 LYMPH NODE (0/1). Microscopic Comment 1. BREAST, INVASIVE TUMOR,  WITH LYMPH NODES PRESENT Specimen, including laterality and lymph node sampling (sentinel, non-sentinel): Left breast and left axillary lymph nodes. Procedure: Lumpectomy and left axillary lymph node resection x 3. Histologic type: Ductal. Grade: I. Tubule formation: 2. Nuclear pleomorphism: 2. Mitotic: 1. Tumor size (gross measurement): 1.2 cm. Margins: Greater than 0.2 cm to all margins. Lymphovascular invasion: Not identified. Ductal carcinoma in situ: Present. Grade: Low grade. Extensive intraductal component: Not identified. Lobular neoplasia: Not  identified. Tumor focality: Unifocal. Treatment effect: N/A. 1 of 3 FINAL for [facility], [place] E ([id number]) Microscopic Comment(continued) Extent of tumor: Confined to breast parenchyma. Lymph nodes: Examined: 3 Sentinel 0 Non-sentinel 3 Total Lymph nodes with metastasis: 0. Breast prognostic profile: [phone]. Estrogen receptor: 100%, strong staining intensity. Progesterone receptor: 70%, strong staining intensity. Her 2 neu: No amplification was detected. The ratio was 1.06. Ki-67: 15%. Non-neoplastic breast: No significant findings. TNM: pT1c, pN0. (JBK:ds 04/27/16)  ONCOTYPE DX RS 17, predicts 10 year distant recurrence risk of 11% with tamoxifen   RADIOGRAPHIC STUDIES: I have personally reviewed the radiological images as listed and agreed with the findings in the report.  Diagnostic Mammogram 07/06/18 IMPRESSION: Benign There is no mammographic evidence of malignancy. A follow-up left mammogram in 6 months (12/2018)  Diagnostic mammogram from [place] on 04/01/2017 IMPRESSION Benign There is no mammographic evidence of malignancy. A follow-up mammogram in 12 months is recommended.   Diagnostic mammogram and ultrasound of the left breast 04/02/2016 Impression: Suspicious of malignancy The 1 cm oval mass in the left breast is suspicious of malignancy, in the left upper outer quadrant posterior depth. Breast composition category C  DEXA 08/27/2016   ASSESSMENT & PLAN:  57 y.o. Caucasian post menopause female, with palpable left breast mass, which was not detected by screening mammogram 3 months ago.  1. Breast cancer of lower-outer quadrant of left female breast, invasive and in situ ductal carcinoma, grade 1, pT1cN0M0, stage IA, ER 100% positive, PR 70% positive, HER-2 negative, RS 17 -I previously reviewed her surgical pathology findings with her and her husband in details, she had early stage breast cancer, was resected completely. --I previously discussed the  Oncotype Dx result was reviewed with her in details. She has low risk based on the recurrence score, which predicts 10 year distant recurrence after 5 years of tamoxifen 11%. She does not need adjuvant chemotherapy -She is on adjuvant letrozole, tolerating well so far, plan for total of 5-7 years  -She does have joint stiffness and mild arthralgia, related to letrozole. This is manageable. We previously discussed switching letrozole to alternative antiestrogen therapy, such as Exemestane or tamoxifen, if her arthralgia or bone density gets worse. -We previously discussed breast cancer surveillance, with annual screening mammogram in routine follow-up with labs and exams. -She had a [place] and biopsy of an small nodule at her incision site in Feb 2019, Biopsy revealed fibrosis, negative for malignancy.  -She is clinically doing well. Labs reviewed, CBC and CMP is WNL, except BG at 111. I encouraged her to watch her weight with exercise and healthy diet as weight gain is a side effect to Letrozole. Her Breast Exam and 06/2018 mammogram was unremarkable. There is no clinical concern for reoccurrence.  -I encouraged her to follow up with her PCP at least once a year to monitor her Glucose, A1c and cholesterol.  -Labs reviewed and adequate to continue with letrozole. -Next mammogram in 12/2018 and Bone Density Scan in 08/2018 -F/u in 8 months, she will see Dr. Excell Seltzer in 2-3  months    2. Osteopenia  -She had repeated DEXA scan in 08/2016, which showed osteopenia, with T score -2.4 at AP lumbar spine, no high risk features for fracture. -We previously discussed her aromatase inhibitor may increase her bone loss. She is very close to osteoporosis, she may likely need bisphosphonate in the near future. -We previously discussed that if her osteopenia gets worse, I may switch her letrozole to tamoxifen -She is taking VitD, I strongly encouraged her to take Calcium as well. She understands.  We previously discussed  the amount of calcium and Vitamin D that she should be taking due to her close level to osteoporosis.  -Due for [facility] D[facility] in 08/2018 [facility]    3. Joint Pain/Stiffness  -secondary to letrozole -She is concerned that this will not fully resolve after ended letrozole  -We previously discussed the other options of and S2 therapy such as tamoxifen, if her stiffness and arthralgia gets worse.  -Much improved now with Magnesium and exercise, will continue with Letrozole   4. Insomnia -She has tried benadryl and Melatonin -I reviewed sleep hygiene with patient -I suggest she try higher dose melatonin   Plan -Continue letrozole  -labs and f/u in 8 months, she will see Dr. Excell Seltzer in 3 months  -DEXA in 08/2018 at [facility], next mammogram in 12/2018     All questions were answered. The patient knows to call the clinic with any problems, questions or concerns. I spent 15 minutes counseling the patient face to face. The total time spent in the appointment was 20 minutes and more than 50% was on counseling.  Oneal Deputy, am acting as scribe for Truitt Merle, MD.   I have reviewed the above documentation for accuracy and completeness, and I agree with the above.     Truitt Merle, MD 07/22/2018 2:36 PM

## 2018-07-22 ENCOUNTER — Inpatient Hospital Stay: Payer: 59 | Attending: Hematology

## 2018-07-22 ENCOUNTER — Telehealth: Payer: Self-pay

## 2018-07-22 ENCOUNTER — Inpatient Hospital Stay (HOSPITAL_BASED_OUTPATIENT_CLINIC_OR_DEPARTMENT_OTHER): Payer: 59 | Admitting: Hematology

## 2018-07-22 ENCOUNTER — Encounter: Payer: Self-pay | Admitting: Hematology

## 2018-07-22 VITALS — BP 126/81 | HR 79 | Temp 98.2°F | Resp 18 | Ht 65.5 in | Wt 149.0 lb

## 2018-07-22 DIAGNOSIS — M8580 Other specified disorders of bone density and structure, unspecified site: Secondary | ICD-10-CM | POA: Diagnosis not present

## 2018-07-22 DIAGNOSIS — Z79899 Other long term (current) drug therapy: Secondary | ICD-10-CM | POA: Insufficient documentation

## 2018-07-22 DIAGNOSIS — C50412 Malignant neoplasm of upper-outer quadrant of left female breast: Secondary | ICD-10-CM | POA: Insufficient documentation

## 2018-07-22 DIAGNOSIS — M2550 Pain in unspecified joint: Secondary | ICD-10-CM | POA: Diagnosis not present

## 2018-07-22 DIAGNOSIS — Z79811 Long term (current) use of aromatase inhibitors: Secondary | ICD-10-CM

## 2018-07-22 DIAGNOSIS — Z923 Personal history of irradiation: Secondary | ICD-10-CM | POA: Diagnosis not present

## 2018-07-22 DIAGNOSIS — G4700 Insomnia, unspecified: Secondary | ICD-10-CM | POA: Insufficient documentation

## 2018-07-22 DIAGNOSIS — E2839 Other primary ovarian failure: Secondary | ICD-10-CM

## 2018-07-22 DIAGNOSIS — Z170 Estrogen receptor positive status [ER+]: Secondary | ICD-10-CM

## 2018-07-22 DIAGNOSIS — Z7982 Long term (current) use of aspirin: Secondary | ICD-10-CM

## 2018-07-22 LAB — CBC WITH DIFFERENTIAL/PLATELET
Abs Immature Granulocytes: 0.02 10*3/uL (ref 0.00–0.07)
BASOS ABS: 0.1 10*3/uL (ref 0.0–0.1)
BASOS PCT: 1 %
EOS ABS: 0.3 10*3/uL (ref 0.0–0.5)
Eosinophils Relative: 5 %
HCT: 39.2 % (ref 36.0–46.0)
Hemoglobin: 12.8 g/dL (ref 12.0–15.0)
Immature Granulocytes: 0 %
Lymphocytes Relative: 26 %
Lymphs Abs: 1.5 10*3/uL (ref 0.7–4.0)
MCH: 31.5 pg (ref 26.0–34.0)
MCHC: 32.7 g/dL (ref 30.0–36.0)
MCV: 96.6 fL (ref 80.0–100.0)
Monocytes Absolute: 0.6 10*3/uL (ref 0.1–1.0)
Monocytes Relative: 9 %
NRBC: 0 % (ref 0.0–0.2)
Neutro Abs: 3.4 10*3/uL (ref 1.7–7.7)
Neutrophils Relative %: 59 %
PLATELETS: 274 10*3/uL (ref 150–400)
RBC: 4.06 MIL/uL (ref 3.87–5.11)
RDW: 12.3 % (ref 11.5–15.5)
WBC: 5.9 10*3/uL (ref 4.0–10.5)

## 2018-07-22 LAB — COMPREHENSIVE METABOLIC PANEL
ALBUMIN: 4.1 g/dL (ref 3.5–5.0)
ALK PHOS: 77 U/L (ref 38–126)
ALT: 21 U/L (ref 0–44)
AST: 19 U/L (ref 15–41)
Anion gap: 10 (ref 5–15)
BUN: 15 mg/dL (ref 6–20)
CALCIUM: 10 mg/dL (ref 8.9–10.3)
CHLORIDE: 103 mmol/L (ref 98–111)
CO2: 28 mmol/L (ref 22–32)
Creatinine, Ser: 0.84 mg/dL (ref 0.44–1.00)
GFR calc non Af Amer: >60 mL/min (ref >60)
Glucose, Bld: 111 mg/dL — ABNORMAL HIGH (ref 70–99)
Potassium: 4.1 mmol/L (ref 3.5–5.1)
SODIUM: 141 mmol/L (ref 135–145)
Total Bilirubin: 0.5 mg/dL (ref 0.3–1.2)
Total Protein: 7.3 g/dL (ref 6.5–8.1)

## 2018-07-22 NOTE — Telephone Encounter (Signed)
Printed avs and calender of upcoming appointment. Per 10/11 los. Patient also had some concern about a NO SHOW ON 9/25. In which she canceled with the automatic system

## 2018-08-04 ENCOUNTER — Other Ambulatory Visit: Payer: Self-pay | Admitting: Obstetrics & Gynecology

## 2018-08-04 NOTE — Telephone Encounter (Signed)
Patient called to request a refill on her sumatriptan for migraines. She said her pharmacy on file told her we declined to authorize the refill. She said she really needs this medication before she comes in for her AEX on 09/02/18.  Pharmacy on file correct.

## 2018-08-05 NOTE — Telephone Encounter (Signed)
Medication refill request:  Imitrex Last AEX:  07/01/17 Next AEX: 111/22/19 Last MMG (if hormonal medication request): 07/06/18 Bi-rads 2 benign  Refill authorized: #6 1 RF

## 2018-08-07 MED ORDER — SUMATRIPTAN SUCCINATE 100 MG PO TABS: 100.0000 mg | ORAL_TABLET | Freq: Once | ORAL | 1 refills | Status: DC

## 2018-08-25 NOTE — Progress Notes (Signed)
57 y.o. [id number] Married White or Caucasian female here for annual exam.  Doing well.  Denies vaginal bleeding.  Exercising regularly.  On letrozole.  Tolerating this very well.  PCP:  Dr. Dorthy Cooler  Patient's last menstrual period was 05/12/2012.          Sexually active: Yes.    The current method of family planning is post menopausal status.    Exercising: Yes.    P90X, walking Smoker:  no  Health Maintenance: Pap:   01/11/15 negative, HR HPV negative, 01/05/14 negative  History of abnormal Pap:  yes MMG: 07/04/18 Colonoscopy:  2011 BMD:   08/27/16 Osteopenia, just had another one done 08/30/18 TDaP:  12/14/11  Pneumonia vaccine(s):  no Shingrix:   no Hep C testing: 03/24/16 Negative Screening Labs: will do it today   reports that she has never smoked. She has never used smokeless tobacco. She reports that she drinks about 4.0 standard drinks of alcohol per week. She reports that she does not use drugs.  Past Medical History:  Diagnosis Date  . Abnormal Pap smear   . Breast cancer ([place])   . Elevated hemoglobin A1c June 2017   5.9  . GERD (gastroesophageal reflux disease)   . Migraine    uses imitrex as needed  . Schatzki's ring    and hiatal hernia on EGD  . Shingles rash 07/2015    shingles on right rib cage   . TMJ (temporomandibular joint syndrome)   . Vertigo     Past Surgical History:  Procedure Laterality Date  . BREAST BIOPSY  1/12   fibrocystic changes   . BREAST LUMPECTOMY WITH RADIOACTIVE SEED AND SENTINEL LYMPH NODE BIOPSY Left 04/22/2016   Procedure: BREAST LUMPECTOMY WITH RADIOACTIVE SEED AND SENTINEL LYMPH NODE BIOPSY;  Surgeon: Excell Seltzer, MD;  Location: [facility];  Service: General;  Laterality: Left;  . DILATION AND CURETTAGE OF UTERUS  1991  . ESOPHAGEAL DILATION    . GYNECOLOGIC CRYOSURGERY  1980s  . LAPAROSCOPIC CHOLECYSTECTOMY  1992  . WISDOM TOOTH EXTRACTION      Current Outpatient Medications  Medication Sig Dispense Refill   . Acetaminophen (TYLENOL PO) Take 1 tablet by mouth as needed. Reported on 04/15/2016    . Ascorbic Acid (VITAMIN C) 100 MG tablet Take 100 mg by mouth daily.    Marland Kitchen CALCIUM PO Take by mouth daily.    . Cholecalciferol (VITAMIN D PO) Take 2,000 Units by mouth every other day.     . ibuprofen (ADVIL,MOTRIN) 800 MG tablet Take 400 mg by mouth as needed for pain.     Marland Kitchen letrozole (FEMARA) 2.5 MG tablet TAKE 1 TABLET BY MOUTH DAILY 90 tablet 0  . Magnesium 100 MG CAPS Take 2 capsules by mouth daily.    Marland Kitchen omeprazole (PRILOSEC) 20 MG capsule Take 20 mg by mouth 2 (two) times daily before a meal.    . Psyllium Husk POWD 1 Dose by Does not apply route daily.    . SUMAtriptan (IMITREX) 100 MG tablet Take 1 tablet (100 mg total) by mouth once for 1 dose. May repeat in 2 hours if headache persists or recurs. 9 tablet 1   No current facility-administered medications for this visit.     Family History  Problem Relation Age of Onset  . Diabetes Father   . Goiter Mother   . Heart disease Unknown     Review of Systems  All other systems reviewed and are negative.   Exam:  BP 120/72 (BP Location: Right Arm, Patient Position: Sitting, Cuff Size: Normal)   Pulse 70   Resp 14   Ht 5' 5.5" (1.664 m)   Wt 150 lb (68 kg)   LMP 05/12/2012   BMI 24.58 kg/m   Height:   Height: 5' 5.5" (166.4 cm)  Ht Readings from Last 3 Encounters:  09/02/18 5' 5.5" (1.664 m)  07/22/18 5' 5.5" (1.664 m)  12/31/17 5' 5.5" (1.664 m)    General appearance: alert, cooperative and appears stated age Head: Normocephalic, without obvious abnormality, atraumatic Neck: no adenopathy, supple, symmetrical, trachea midline and thyroid normal to inspection and palpation Lungs: clear to auscultation bilaterally Breasts: normal appearance, no masses or tenderness Heart: regular rate and rhythm Abdomen: soft, non-tender; bowel sounds normal; no masses,  no organomegaly Extremities: extremities normal, atraumatic, no cyanosis or  edema Skin: Skin color, texture, turgor normal. No rashes or lesions Lymph nodes: Cervical, supraclavicular, and axillary nodes normal. No abnormal inguinal nodes palpated Neurologic: Grossly normal   Pelvic: External genitalia:  no lesions              Urethra:  normal appearing urethra with no masses, tenderness or lesions              Bartholins and Skenes: normal                 Vagina: normal appearing vagina with normal color and discharge, no lesions              Cervix: no lesions              Pap taken: Yes.   Bimanual Exam:  Uterus:  normal size, contour, position, consistency, mobility, non-tender              Adnexa: normal adnexa and no mass, fullness, tenderness               Rectovaginal: Confirms               Anus:  normal sphincter tone, no lesions  Chaperone was present for exam.  A:  Well Woman with normal exam PMP, no HRT H/o breast cancer, ER/PR positive, her-2 negative, Stage IA, on Letrozole for 5-7 H/o migraines, improved since menopause  P:   Mammogram guidelines reviewed.  Doing 3D. pap smear with HR HPV obtained today TSH, Vit D, Lipids Shingrix vaccination discussed Will get BMD result from [place] return annually or prn

## 2018-08-30 ENCOUNTER — Encounter: Payer: Self-pay | Admitting: Hematology

## 2018-08-30 DIAGNOSIS — Z8262 Family history of osteoporosis: Secondary | ICD-10-CM | POA: Diagnosis not present

## 2018-08-30 DIAGNOSIS — M810 Age-related osteoporosis without current pathological fracture: Secondary | ICD-10-CM | POA: Diagnosis not present

## 2018-08-30 DIAGNOSIS — Z853 Personal history of malignant neoplasm of breast: Secondary | ICD-10-CM | POA: Diagnosis not present

## 2018-09-02 ENCOUNTER — Other Ambulatory Visit (HOSPITAL_COMMUNITY)
Admission: RE | Admit: 2018-09-02 | Discharge: 2018-09-02 | Disposition: A | Discharge status: Home / Self Care | Payer: 59 | Source: Ambulatory Visit | Attending: Obstetrics & Gynecology | Admitting: Obstetrics & Gynecology

## 2018-09-02 ENCOUNTER — Telehealth: Payer: Self-pay | Admitting: Nurse Practitioner

## 2018-09-02 ENCOUNTER — Ambulatory Visit (INDEPENDENT_AMBULATORY_CARE_PROVIDER_SITE_OTHER): Payer: 59 | Admitting: Obstetrics & Gynecology

## 2018-09-02 ENCOUNTER — Other Ambulatory Visit: Payer: Self-pay

## 2018-09-02 ENCOUNTER — Encounter: Payer: Self-pay | Admitting: Obstetrics & Gynecology

## 2018-09-02 VITALS — BP 120/72 | HR 70 | Resp 14 | Ht 65.5 in | Wt 150.0 lb

## 2018-09-02 DIAGNOSIS — Z0000 Encounter for general adult medical examination without abnormal findings: Secondary | ICD-10-CM | POA: Diagnosis not present

## 2018-09-02 DIAGNOSIS — Z01419 Encounter for gynecological examination (general) (routine) without abnormal findings: Secondary | ICD-10-CM

## 2018-09-02 DIAGNOSIS — Z124 Encounter for screening for malignant neoplasm of cervix: Secondary | ICD-10-CM

## 2018-09-02 NOTE — Telephone Encounter (Signed)
I called patient to discuss recent DEXA which shows T score -2.8 at the spine, she now has osteoporosis. She was previously taking calcium and vitamin D but was "not very disciplined." I discussed pro/cons of bisphosphonates such as fosamax. She had to end the call but plans to call back next week to discuss further. She appreciated the call.

## 2018-09-03 LAB — LIPID PANEL
Chol/HDL Ratio: 2.6 ratio (ref 0.0–4.4)
Cholesterol, Total: 202 mg/dL — ABNORMAL HIGH (ref 100–199)
HDL: 77 mg/dL (ref >39)
LDL Calculated: 103 mg/dL — ABNORMAL HIGH (ref 0–99)
Triglycerides: 109 mg/dL (ref 0–149)
VLDL Cholesterol Cal: 22 mg/dL (ref 5–40)

## 2018-09-03 LAB — TSH: TSH: 1.14 u[IU]/mL (ref 0.450–4.500)

## 2018-09-03 LAB — VITAMIN D 25 HYDROXY (VIT D DEFICIENCY, FRACTURES): VIT D 25 HYDROXY: 43.6 ng/mL (ref 30.0–100.0)

## 2018-09-06 LAB — CYTOLOGY - PAP
DIAGNOSIS: NEGATIVE
HPV: NOT DETECTED

## 2018-09-07 DIAGNOSIS — Z0000 Encounter for general adult medical examination without abnormal findings: Secondary | ICD-10-CM | POA: Diagnosis not present

## 2018-09-14 ENCOUNTER — Telehealth: Payer: Self-pay | Admitting: Nurse Practitioner

## 2018-09-14 ENCOUNTER — Telehealth: Payer: Self-pay

## 2018-09-14 MED ORDER — TAMOXIFEN CITRATE 20 MG PO TABS: 20.0000 mg | ORAL_TABLET | Freq: Every day | ORAL | 2 refills | Status: DC

## 2018-09-14 NOTE — Telephone Encounter (Signed)
Patient calls stating she received a phone call that she has osteoporosis. Has been taking Letrozole for a while.  Wants advice.   [phone]

## 2018-09-14 NOTE — Telephone Encounter (Signed)
Called patient to resume conversation about her DEXA which shows osteoporosis at the spine, T score -2.8. Her previous DEXA showed osteopenia. She is active and takes calcium and vitamin D. We discussed her options, which include changing to tamoxifen and starting bisphosphonate. I reviewed potential side effects and benefit of tamoxifen. She elects to dc letrozole and start tamoxifen. She prefers to hold on starting bisphosphonate and would rather continue exercises, calcium, and vitamin D. Will send new Rx to pharmacy. She will f/u in 2-3 months after starting tamoxifen to monitor side effects. I will send schedule message.

## 2018-09-15 ENCOUNTER — Telehealth: Payer: Self-pay | Admitting: Hematology

## 2018-09-15 NOTE — Telephone Encounter (Signed)
Scheduled appt per 12/4 sch message - pt is aware of appt - reminder letter sent in the mail .

## 2018-10-25 ENCOUNTER — Encounter: Payer: Self-pay | Admitting: Psychiatry

## 2018-10-25 ENCOUNTER — Ambulatory Visit (INDEPENDENT_AMBULATORY_CARE_PROVIDER_SITE_OTHER): Payer: 59 | Admitting: Psychiatry

## 2018-10-25 DIAGNOSIS — F4323 Adjustment disorder with mixed anxiety and depressed mood: Secondary | ICD-10-CM

## 2018-10-25 NOTE — Progress Notes (Signed)
      [facility] Counselor/Therapist Progress Note  Patient ID: Tiffany Nash, MRN: [id number],    Date: 10/25/2018  Time Spent: 52 minutes   Treatment Type: Individual Therapy  Reported Symptoms: Anxious Mood and Irritability  Mental Status Exam:  Appearance:   Casual and Well Groomed     Behavior:  Appropriate  Motor:  Normal  Speech/Language:   Clear and Coherent  Affect:  Appropriate  Mood:  anxious and sad  Thought process:  normal  Thought content:    WNL  Sensory/Perceptual disturbances:    WNL  Orientation:  oriented to person, place, time/date and situation  Attention:  Good  Concentration:  Good  Memory:  WNL  Fund of knowledge:   Good  Insight:    Good  Judgment:   Good  Impulse Control:  Good   Risk Assessment: Danger to Self:  No Self-injurious Behavior: No Danger to Others: No Duty to Warn:no Physical Aggression / Violence:No  Access to Firearms a concern: No  Gang Involvement:No   Subjective: The client states she is having continued issues with her husband.  Her husband has an Web designer in his office that the client feels he has to into bit of a relationship with her.  She feels it is more but it should be and possibly verges on an emotional affair.  She is worried about what is going on with him and he tells her that she is making too much of it.  The client related different episodes that have sent up red flags for her.  The client is recovering from breast cancer.  She is currently on tamoxifen.  The client is unsure if she wants to hire a Games developer to figure things out.  We discussed the thinking area called mind reading..  I explained to the client that she is reading into things without really knowing truly what is going on.  Trying to make decisions based on that information can lead to bad outcomes.  The client understands but Tiffany Nash pursue that anyway for her peace of mind.  She agrees to take this up at next  session.  Interventions: Assertiveness/Communication, Solution-Oriented/Positive Psychology and Insight-Oriented  Diagnosis:   ICD-10-CM   1. Adjustment disorder with mixed anxiety and depressed mood F43.23     Plan: Assertiveness, boundaries.  Tiffany Nash, [place]

## 2018-11-04 ENCOUNTER — Telehealth: Payer: Self-pay

## 2018-11-04 NOTE — Telephone Encounter (Signed)
Sent signed order to [facility], sent to HIM for scanning to chart.

## 2018-11-16 ENCOUNTER — Ambulatory Visit (INDEPENDENT_AMBULATORY_CARE_PROVIDER_SITE_OTHER): Payer: 59 | Admitting: Psychiatry

## 2018-11-16 ENCOUNTER — Encounter: Payer: Self-pay | Admitting: Psychiatry

## 2018-11-16 DIAGNOSIS — F4323 Adjustment disorder with mixed anxiety and depressed mood: Secondary | ICD-10-CM | POA: Diagnosis not present

## 2018-11-16 NOTE — Progress Notes (Signed)
      [facility] Counselor/Therapist Progress Note  Patient ID: Tiffany Nash, MRN: [id number],    Date: 11/16/2018  Time Spent: 52 minutes   Treatment Type: Individual Therapy  Reported Symptoms: Anxious Mood  Mental Status Exam:  Appearance:   Casual and Well Groomed     Behavior:  Appropriate  Motor:  Normal  Speech/Language:   Clear and Coherent  Affect:  Appropriate  Mood:  anxious and sad  Thought process:  normal  Thought content:    WNL  Sensory/Perceptual disturbances:    WNL  Orientation:  oriented to person, place, time/date and situation  Attention:  Good  Concentration:  Good  Memory:  WNL  Fund of knowledge:   Good  Insight:    Good  Judgment:   Good  Impulse Control:  Good   Risk Assessment: Danger to Self:  No Self-injurious Behavior: No Danger to Others: No Duty to Warn:no Physical Aggression / Violence:No  Access to Firearms a concern: No  Gang Involvement:No   Subjective: The client states that she has not hired a Games developer to follow her husband.  She is concerned that he is having an emotional affair with his Web designer.  Over the years she has had many reasons not to trust him.  He has never stepped out on the marriage but he is been less than honest about his behavior. We discussed the thinking errors that are going on between both of them.  I have asked the client to follow-up with Jacqualine Mau, LPC to enter into some couples work.  I believe working on their communication would be the best thing for them.  It seems when she asks him about what is going on he interprets that as he is done something wrong and then becomes defensive.  She then reports she gets angry and things devolve from there.  The client agrees to call Mr. White.  Interventions: Assertiveness/Communication, Solution-Oriented/Positive Psychology and Insight-Oriented  Diagnosis:   ICD-10-CM   1. Adjustment disorder with mixed anxiety and depressed mood  F43.23     Plan: Couples work, review thinking errors, assertiveness.  Tiffany Nash, [place]

## 2018-12-02 DIAGNOSIS — J0100 Acute maxillary sinusitis, unspecified: Secondary | ICD-10-CM | POA: Diagnosis not present

## 2018-12-08 ENCOUNTER — Telehealth: Payer: Self-pay

## 2018-12-08 ENCOUNTER — Telehealth: Payer: Self-pay | Admitting: Hematology

## 2018-12-08 ENCOUNTER — Other Ambulatory Visit: Payer: Self-pay | Admitting: Hematology

## 2018-12-08 MED ORDER — VENLAFAXINE HCL ER 37.5 MG PO CP24: 37.5000 mg | ORAL_CAPSULE | Freq: Every day | ORAL | 2 refills | Status: DC

## 2018-12-08 NOTE — Telephone Encounter (Signed)
Patient calls stating having a lot of sleep interruption and anxiety.  Feels like it got worse with Tamoxifen.  She had been taking two Benadryl at night to help with sleep but it isn't working.  A friend of hers suggested a low dose of Zoloft which might help.  Forwarded to Dr. Burr Medico  Her [phone]

## 2018-12-08 NOTE — Telephone Encounter (Signed)
I called patient back today, regarding her insomnia.  She noticed wake up in the middle of night or early morning often in the past 2 to 3 months.  No significant night sweats, hot flashes, or mood swing.  We changed her letrozole to tamoxifen in November 2019, due to osteoporosis.  Her insomnia could be related to tamoxifen.  We discussed sleep hygiene again, she has been using Benadryl as needed.  I recommend her try Effexor low-dose at 37.5 mg daily, to see if that helps her sleep.  Potential benefit and side effects discussed, she agrees.  Prescription was called in today.  Truitt Merle  12/08/2018

## 2018-12-08 NOTE — Telephone Encounter (Signed)
Chart review.

## 2018-12-13 ENCOUNTER — Ambulatory Visit: Payer: 59 | Admitting: Psychiatry

## 2018-12-14 NOTE — Progress Notes (Signed)
[facility]   [phone] [phone]   Clinic Follow up Note   Patient Care Team: Lujean Amel, MD as PCP - General (Family Medicine) Excell Seltzer, MD as Consulting Physician (General Surgery) Truitt Merle, MD as Consulting Physician (Hematology) Kyung Rudd, MD as Consulting Physician (Radiation Oncology)  Date of Service:  12/16/2018  CHIEF COMPLAINT: Follow up left breast cancer   SUMMARY OF ONCOLOGIC HISTORY: Oncology History   Breast cancer of upper-outer quadrant of left female breast [facility])   Staging form: Breast, AJCC 7th Edition   - Clinical stage from 04/15/2016: Stage IA (T1b, N0, M0) - Unsigned         Staging comments: Staged at breast conference on 7.5.17   - Pathologic stage from 04/22/2016: Stage IA (T1c, N0, cM0) - Unsigned        Breast cancer of upper-outer quadrant of left female breast ([place])   04/02/2016 Mammogram    1 cm oval massin the upper outer quadrant of left breast, suspicious for malignancy.     04/08/2016 Initial Diagnosis    Breast cancer of upper-outer quadrant of left female breast ([place])    04/08/2016 Initial Biopsy    Left breast core needle biopsy showed invasive ductal carcinoma and DCIS, grade 1-2.    04/08/2016 Receptors her2    Your 100% positive, strong staining, PR 70% positive, strong staining, HER-2 negative, Ki-67 15%    04/22/2016 Surgery    Left breast lumpectomy and SLN biopsy ([facility])    04/22/2016 Pathology Results    Left breast lumpectomy showed invasive ductal carcinoma, grade 1, 1.2 cm, low-grade DCIS, surgical margins were negative, 3 sentinel lymph nodes negative.    04/22/2016 Oncotype testing    RS 17, low risk, predicts 10 year distant recurrence risk of 11% with tamoxifen    05/26/2016 - 07/13/2016 Radiation Therapy    Adjuvant breast radiation [facility]): Left Breast/ 50.4 Gy in 28 fx.  Boost/ 10 Gy in 5 fx    07/2016 -  Anti-estrogen oral therapy    Letrozole 2.5 mg daily.  Planned duration of therapy: 5-10 years.  Switched to Tamoxifen from 09/2018-12/2018 due to osteporosis, but due to poor tolerance we switched her to Letrozole again in 12/2018.     08/27/2016 Imaging    DEXA scan: Osteopenia. (T-score -2.4).     11/22/2017 Pathology Results    Diagnosis 11/22/17 Breast, left, needle core biopsy - DENSE STROMAL FIBROSIS, CONSISTENT WITH PRIOR SURGICAL PROCEDURE. - THERE IS NO EVIDENCE OF MALIGNANCY. - SEE COMMENT.      CURRENT THERAPY:  Letrozole 2.5 mg daily started in 07/2016. Switched to Tamoxifen from 09/2018-12/2018 due to osteoporosis, but due to poor tolerance we switched her to Letrozole again in 12/2018.   INTERVAL HISTORY:  Tiffany Nash is here for a follow up of left breast cancer. She was last seen by me 5 months ago. She presents to the clinic today with her husband. She notes she has not tried Effexor but has the pills. She wanted to try CBD gummies first but that did not work for her. She still is not sleeping well. She has tried melatonin, other OTC sleep aides and benadryl. Sometimes she has trouble falling asleep of frequently wake up at night. This will lead to needing a nap during the day.  With Letrozole, she notes vaginal discharge and dryness no blood. She denies any other vaginal symptoms.    REVIEW OF SYSTEMS:   Constitutional: Denies fevers, chills or  abnormal weight loss (+) insomnia  Eyes: Denies blurriness of vision Ears, nose, mouth, throat, and face: Denies mucositis or sore throat Respiratory: Denies cough, dyspnea or wheezes Cardiovascular: Denies palpitation, chest discomfort or lower extremity swelling Gastrointestinal:  Denies nausea, heartburn or change in bowel habits Skin: Denies abnormal skin rashes UA: (+) Vaginal dryness and discharge  Lymphatics: Denies new lymphadenopathy or easy bruising Neurological:Denies numbness, tingling or new weaknesses Behavioral/Psych: Mood is stable, no new changes  All other  systems were reviewed with the patient and are negative.  MEDICAL HISTORY:  Past Medical History:  Diagnosis Date  . Abnormal Pap smear   . Breast cancer ([place])   . Elevated hemoglobin A1c June 2017   5.9  . GERD (gastroesophageal reflux disease)   . Migraine    uses imitrex as needed  . Schatzki's ring    and hiatal hernia on EGD  . Shingles rash 07/2015    shingles on right rib cage   . TMJ (temporomandibular joint syndrome)   . Vertigo     SURGICAL HISTORY: Past Surgical History:  Procedure Laterality Date  . BREAST BIOPSY  1/12   fibrocystic changes   . BREAST LUMPECTOMY WITH RADIOACTIVE SEED AND SENTINEL LYMPH NODE BIOPSY Left 04/22/2016   Procedure: BREAST LUMPECTOMY WITH RADIOACTIVE SEED AND SENTINEL LYMPH NODE BIOPSY;  Surgeon: Excell Seltzer, MD;  Location: [facility];  Service: General;  Laterality: Left;  . DILATION AND CURETTAGE OF UTERUS  1991  . ESOPHAGEAL DILATION    . GYNECOLOGIC CRYOSURGERY  1980s  . LAPAROSCOPIC CHOLECYSTECTOMY  1992  . WISDOM TOOTH EXTRACTION      I have reviewed the social history and family history with the patient and they are unchanged from previous note.  ALLERGIES:  is allergic to penicillins and tylox [oxycodone-acetaminophen].  MEDICATIONS:  Current Outpatient Medications  Medication Sig Dispense Refill  . tamoxifen (NOLVADEX) 20 MG tablet Take 1 tablet (20 mg total) by mouth daily. 60 tablet 2  . venlafaxine XR (EFFEXOR XR) 37.5 MG 24 hr capsule Take 1 capsule (37.5 mg total) by mouth daily. 30 capsule 2  . Acetaminophen (TYLENOL PO) Take 1 tablet by mouth as needed. Reported on 04/15/2016    . Ascorbic Acid (VITAMIN C) 100 MG tablet Take 100 mg by mouth daily.    Marland Kitchen CALCIUM PO Take by mouth daily.    . Cholecalciferol (VITAMIN D PO) Take 2,000 Units by mouth every other day.     . ibuprofen (ADVIL,MOTRIN) 800 MG tablet Take 400 mg by mouth as needed for pain.     Marland Kitchen letrozole (FEMARA) 2.5 MG tablet Take 1 tablet  (2.5 mg total) by mouth daily. 30 tablet 5  . Magnesium 100 MG CAPS Take 2 capsules by mouth daily.    Marland Kitchen omeprazole (PRILOSEC) 20 MG capsule Take 20 mg by mouth 2 (two) times daily before a meal.    . Psyllium Husk POWD 1 Dose by Does not apply route daily.    . SUMAtriptan (IMITREX) 100 MG tablet Take 1 tablet (100 mg total) by mouth once for 1 dose. May repeat in 2 hours if headache persists or recurs. 9 tablet 1   No current facility-administered medications for this visit.     PHYSICAL EXAMINATION: ECOG PERFORMANCE STATUS: 0 - Asymptomatic  Vitals:   12/16/18 0920  BP: 117/78  Pulse: 86  Resp: 18  Temp: 98.7 F (37.1 C)  SpO2: 100%   Filed Weights   12/16/18 0920  Weight: 149 lb 14.4 oz (68 kg)    GENERAL:alert, no distress and comfortable SKIN: skin color, texture, turgor are normal, no rashes or significant lesions EYES: normal, Conjunctiva are pink and non-injected, sclera clear OROPHARYNX:no exudate, no erythema and lips, buccal mucosa, and tongue normal  NECK: supple, thyroid normal size, non-tender, without nodularity LYMPH:  no palpable lymphadenopathy in the cervical, axillary or inguinal LUNGS: clear to auscultation and percussion with normal breathing effort HEART: regular rate & rhythm and no murmurs and no lower extremity edema ABDOMEN:abdomen soft, non-tender and normal bowel sounds Musculoskeletal:no cyanosis of digits and no clubbing  NEURO: alert & oriented x 3 with fluent speech, no focal motor/sensory deficits BREAST: S/p left breast lumpectomy: Surgical incision healed well (+) No palpable mass or adenopathy in either breast   LABORATORY DATA:  I have reviewed the data as listed CBC Latest Ref Rng & Units 12/16/2018 07/22/2018 12/31/2017  WBC 4.0 - 10.5 K/uL 5.6 5.9 4.1  Hemoglobin 12.0 - 15.0 g/dL 12.9 12.8 12.7  Hematocrit 36.0 - 46.0 % 40.0 39.2 37.5  Platelets 150 - 400 K/uL 287 274 277     CMP Latest Ref Rng & Units 12/16/2018 07/22/2018  12/31/2017  Glucose 70 - 99 mg/dL 92 111(H) 105  BUN 6 - 20 mg/dL _0 Creatinine 0.44 - 1.00 mg/dL 0.83 0.84 0.80  Sodium 135 - 145 mmol/L 141 141 137  Potassium 3.5 - 5.1 mmol/L 4.0 4.1 4.0  Chloride 98 - 111 mmol/L 108 103 102  CO2 22 - 32 mmol/L _1 Calcium 8.9 - 10.3 mg/dL 9.4 10.0 9.6  Total Protein 6.5 - 8.1 g/dL 7.3 7.3 7.4  Total Bilirubin 0.3 - 1.2 mg/dL 0.3 0.5 0.3  Alkaline Phos 38 - 126 U/L 55 77 88  AST 15 - 41 U/L _2 ALT 0 - 44 U/L _3 RADIOGRAPHIC STUDIES: I have personally reviewed the radiological images as listed and agreed with the findings in the report. No results found.   ASSESSMENT & PLAN:  Tiffany Nash is a 58 y.o. female with   1. Breast cancer of lower-outer quadrant of left female breast, invasive and in situ ductal carcinoma, grade 1, pT1cN0M0, stage IA, ER 100% positive, PR 70% positive, HER-2 negative, RS 17 -She was diagnosed in 03/2016. She is s/p left breast lumpectomy and adjuvant radiation.  -She has low risk based on the recurrence score, which predicts 10 year distant recurrence after 5 years of tamoxifen 11%. She does not need adjuvant chemotherapy -She started anti-estrogen therapy with letrozole in 07/2016. Due to Osteoporosis I switched her to Tamoxifen in 09/2018. She has not tolerated well due to insomnia and vaginal discharge.  -We again discussed the different benefit and side effects between Tamoxifen and Letrozole.  After lengthy discussion, especially the side effects she has from tamoxifen, she agrees to switch back to the letrozole. -She is clinically doing well. Lab reviewed, her CBC WNL and CMP WNL. Her physical exam and her 06/2018 mammogram were unremarkable. There is no clinical concern for recurrence. -I will refill Letrozole today and start Zometa in 2 weeks  -F/u in 6 months   2. Osteoporosis -Her 08/2018 DEXA from [place] shows osteoporosis with Lowest T-score of -2.8 at [facility].  -We switched  her Letrozole to Tamoxifen in 09/2018, but has not been tolerating very well. She is considering restarting Letrozole.  -I discussed bisphosphonate to help strengthen  her bone.  I recommend Zometa infusion every 6 months for 2 years.  I reviewed benefits and side effects, especially low calcium and jaw necrosis. I gave her reading material on this. She is interested.  -I strongly encouraged her to maintain dental health and dentist visits.  -continue exercises and daily calcium and vitamin D -Plan to start Zometa in 2 weeks and continue every 6 months for 2 years  3. Joint Pain/Stiffness  -secondary to letrozole -Eventually resolved after ending letrozole. Will monitor with restarting Letrozole   4. Insomnia, Vaginal discharge  -likely secondary to Tamoxifen  -She has tried benadryl and Melatonin and OTC sleep aides. I reviewed sleep hygiene with patient -I previously prescribed her Effexor but she did not try it.  -She has been having recent vaginal dryness and discharge  -I discussed if this does not improve, we can switch back to Letrozole. She opted to restart Letrozole.    Plan Zometa infusion in 2 weeks Lab, f/u and Zometa in fusion in 6 months  Restart letrozole, refilled today     No problem-specific Assessment & Plan notes found for this encounter.   No orders of the defined types were placed in this encounter.  All questions were answered. The patient knows to call the clinic with any problems, questions or concerns. No barriers to learning was detected. I spent 25 minutes counseling the patient face to face. The total time spent in the appointment was 30 minutes and more than 50% was on counseling and review of test results     Truitt Merle, MD 12/16/2018   I, Joslyn Devon, am acting as scribe for Truitt Merle, MD.   I have reviewed the above documentation for accuracy and completeness, and I agree with the above.

## 2018-12-16 ENCOUNTER — Inpatient Hospital Stay: Payer: 59 | Attending: Hematology | Admitting: Hematology

## 2018-12-16 ENCOUNTER — Telehealth: Payer: Self-pay | Admitting: Hematology

## 2018-12-16 ENCOUNTER — Inpatient Hospital Stay: Payer: 59

## 2018-12-16 ENCOUNTER — Encounter: Payer: Self-pay | Admitting: Hematology

## 2018-12-16 VITALS — BP 117/78 | HR 86 | Temp 98.7°F | Resp 18 | Ht 65.0 in | Wt 149.9 lb

## 2018-12-16 DIAGNOSIS — G4700 Insomnia, unspecified: Secondary | ICD-10-CM | POA: Insufficient documentation

## 2018-12-16 DIAGNOSIS — Z79899 Other long term (current) drug therapy: Secondary | ICD-10-CM | POA: Insufficient documentation

## 2018-12-16 DIAGNOSIS — M816 Localized osteoporosis [Lequesne]: Secondary | ICD-10-CM

## 2018-12-16 DIAGNOSIS — Z170 Estrogen receptor positive status [ER+]: Secondary | ICD-10-CM | POA: Insufficient documentation

## 2018-12-16 DIAGNOSIS — C50512 Malignant neoplasm of lower-outer quadrant of left female breast: Secondary | ICD-10-CM | POA: Insufficient documentation

## 2018-12-16 DIAGNOSIS — K219 Gastro-esophageal reflux disease without esophagitis: Secondary | ICD-10-CM | POA: Diagnosis not present

## 2018-12-16 DIAGNOSIS — Z791 Long term (current) use of non-steroidal anti-inflammatories (NSAID): Secondary | ICD-10-CM | POA: Insufficient documentation

## 2018-12-16 DIAGNOSIS — N898 Other specified noninflammatory disorders of vagina: Secondary | ICD-10-CM | POA: Diagnosis not present

## 2018-12-16 DIAGNOSIS — M810 Age-related osteoporosis without current pathological fracture: Secondary | ICD-10-CM | POA: Insufficient documentation

## 2018-12-16 DIAGNOSIS — Z79811 Long term (current) use of aromatase inhibitors: Secondary | ICD-10-CM | POA: Diagnosis not present

## 2018-12-16 DIAGNOSIS — C50412 Malignant neoplasm of upper-outer quadrant of left female breast: Secondary | ICD-10-CM

## 2018-12-16 DIAGNOSIS — M2550 Pain in unspecified joint: Secondary | ICD-10-CM | POA: Diagnosis not present

## 2018-12-16 LAB — COMPREHENSIVE METABOLIC PANEL
ALBUMIN: 4.3 g/dL (ref 3.5–5.0)
ALK PHOS: 55 U/L (ref 38–126)
ALT: 17 U/L (ref 0–44)
AST: 20 U/L (ref 15–41)
Anion gap: 9 (ref 5–15)
BILIRUBIN TOTAL: 0.3 mg/dL (ref 0.3–1.2)
BUN: 15 mg/dL (ref 6–20)
CALCIUM: 9.4 mg/dL (ref 8.9–10.3)
CO2: 24 mmol/L (ref 22–32)
CREATININE: 0.83 mg/dL (ref 0.44–1.00)
Chloride: 108 mmol/L (ref 98–111)
GFR calc Af Amer: >60 mL/min (ref >60)
GFR calc non Af Amer: >60 mL/min (ref >60)
GLUCOSE: 92 mg/dL (ref 70–99)
Potassium: 4 mmol/L (ref 3.5–5.1)
SODIUM: 141 mmol/L (ref 135–145)
TOTAL PROTEIN: 7.3 g/dL (ref 6.5–8.1)

## 2018-12-16 LAB — CBC WITH DIFFERENTIAL/PLATELET
Abs Immature Granulocytes: 0.01 10*3/uL (ref 0.00–0.07)
BASOS ABS: 0.1 10*3/uL (ref 0.0–0.1)
BASOS PCT: 1 %
Eosinophils Absolute: 0.3 10*3/uL (ref 0.0–0.5)
Eosinophils Relative: 5 %
HEMATOCRIT: 40 % (ref 36.0–46.0)
Hemoglobin: 12.9 g/dL (ref 12.0–15.0)
IMMATURE GRANULOCYTES: 0 %
LYMPHS ABS: 1.4 10*3/uL (ref 0.7–4.0)
Lymphocytes Relative: 25 %
MCH: 31.8 pg (ref 26.0–34.0)
MCHC: 32.3 g/dL (ref 30.0–36.0)
MCV: 98.5 fL (ref 80.0–100.0)
Monocytes Absolute: 0.4 10*3/uL (ref 0.1–1.0)
Monocytes Relative: 8 %
NEUTROS PCT: 61 %
NRBC: 0 % (ref 0.0–0.2)
Neutro Abs: 3.4 10*3/uL (ref 1.7–7.7)
Platelets: 287 10*3/uL (ref 150–400)
RBC: 4.06 MIL/uL (ref 3.87–5.11)
RDW: 12.7 % (ref 11.5–15.5)
WBC: 5.6 10*3/uL (ref 4.0–10.5)

## 2018-12-16 MED ORDER — LETROZOLE 2.5 MG PO TABS: 2.5000 mg | ORAL_TABLET | Freq: Every day | ORAL | 5 refills | Status: DC

## 2018-12-16 NOTE — Telephone Encounter (Signed)
Scheduled appt per 3/6 los.  Patient my chart active and did not need a calendar and avs.

## 2018-12-19 ENCOUNTER — Encounter: Payer: Self-pay | Admitting: Hematology

## 2018-12-27 ENCOUNTER — Encounter: Payer: Self-pay | Admitting: Psychiatry

## 2018-12-27 ENCOUNTER — Ambulatory Visit (INDEPENDENT_AMBULATORY_CARE_PROVIDER_SITE_OTHER): Payer: 59 | Admitting: Psychiatry

## 2018-12-27 ENCOUNTER — Other Ambulatory Visit: Payer: Self-pay

## 2018-12-27 DIAGNOSIS — F4323 Adjustment disorder with mixed anxiety and depressed mood: Secondary | ICD-10-CM

## 2018-12-27 NOTE — Progress Notes (Signed)
      [facility] Counselor/Therapist Progress Note  Patient ID: Tiffany Nash, MRN: [id number],    Date: 12/27/2018   Time Spent: 50 minutes   Treatment Type: Individual Therapy  Reported Symptoms: anxiety, irritable   Mental Status Exam:  Appearance:   Casual and Well Groomed     Behavior:  Appropriate  Motor:  Normal  Speech/Language:   Clear and Coherent  Affect:  Appropriate  Mood:  anxious and irritable  Thought process:  normal  Thought content:    WNL  Sensory/Perceptual disturbances:    WNL  Orientation:  oriented to person, place, time/date and situation  Attention:  Good  Concentration:  Good  Memory:  WNL  Fund of knowledge:   Good  Insight:    Good  Judgment:   Good  Impulse Control:  Good   Risk Assessment: Danger to Self:  No Self-injurious Behavior: No Danger to Others: No Duty to Warn:no Physical Aggression / Violence:No  Access to Firearms a concern: No  Gang Involvement:No   Subjective: Client states that she and her husband did meet with Dr. Agustina Caroli for some couples counseling.  The initial session went well.  Dr. Sima Matas then met with the client individually and this next week will meet with the husband.  Client states that her husband is very negative about the couples counseling process.  He states that, "this is not going to go well."  The client notes that she has been so irritated and frustrated with her husband. Today we focused on what the clients actual goals would be for her marriage counseling. 1) transparency 2) honesty 3) respect. The client feels that if she can get these that their relationship will be in a much better for him.  The client has done well in being assertive in her communication.  She is also been more effective in setting boundaries even when her husband has been dismissive of her concerns.  She still sticks to her guns and makes her point.  Interventions: Dialectical Behavioral Therapy,  Solution-Oriented/Positive Psychology and Insight-Oriented  Diagnosis:   ICD-10-CM   1. Adjustment disorder with mixed anxiety and depressed mood F43.23     Plan: Boundaries, assertiveness, self-care, couples counseling.  This record has been created using [employer].  Chart creation errors have been sought, but Princella Jaskiewicz not always have been located and corrected. Such creation errors do not reflect on the standard of medical care.   Clinton Dragone, [place]

## 2019-01-02 ENCOUNTER — Inpatient Hospital Stay: Payer: 59

## 2019-01-02 ENCOUNTER — Other Ambulatory Visit: Payer: Self-pay

## 2019-01-02 DIAGNOSIS — M816 Localized osteoporosis [Lequesne]: Secondary | ICD-10-CM

## 2019-01-02 DIAGNOSIS — M810 Age-related osteoporosis without current pathological fracture: Secondary | ICD-10-CM | POA: Diagnosis not present

## 2019-01-02 MED ORDER — SODIUM CHLORIDE 0.9 % IV SOLN
Freq: Once | INTRAVENOUS | Status: AC
  Administered 2019-01-02: 09:00:00 via INTRAVENOUS
  Filled 2019-01-02: qty 250

## 2019-01-02 MED ORDER — ZOLEDRONIC ACID 4 MG/100ML IV SOLN
4.0000 mg | Freq: Once | INTRAVENOUS | Status: AC
  Administered 2019-01-02: 4 mg via INTRAVENOUS
  Filled 2019-01-02: qty 100

## 2019-01-02 NOTE — Patient Instructions (Signed)

## 2019-01-03 ENCOUNTER — Telehealth: Payer: Self-pay | Admitting: *Deleted

## 2019-01-03 NOTE — Telephone Encounter (Signed)
I agree with the assesement and plan, thanks Benjamine Mola.  Truitt Merle MD

## 2019-01-03 NOTE — Telephone Encounter (Signed)
Received VM message from patient stating she thinks she is having some side effects from the Zometa she received yesterday. TCT patient and spoke with her. She states she tolerated the infusion well yesterday but today she is having chills, aches all over. She thinks she may have a fever but she does not have a functional thermometer at this time. Denies SOB, cough. Informed patient this is likely a side effect of the zometa and to take tylenol or ibuprofen for symptomatic relief. Encouraged pt to rest, pusdh fluids and take the tylenol or ibuprofen as needed.  Advised to call back if symptoms do not improve over the next few days.  Pt voiced understanding

## 2019-01-05 ENCOUNTER — Encounter: Payer: Self-pay | Admitting: Hematology

## 2019-01-05 DIAGNOSIS — Z853 Personal history of malignant neoplasm of breast: Secondary | ICD-10-CM | POA: Diagnosis not present

## 2019-01-10 ENCOUNTER — Encounter: Payer: Self-pay | Admitting: Hematology

## 2019-01-10 ENCOUNTER — Ambulatory Visit: Payer: 59 | Admitting: Psychiatry

## 2019-01-10 ENCOUNTER — Other Ambulatory Visit: Payer: Self-pay | Admitting: Obstetrics & Gynecology

## 2019-01-10 NOTE — Telephone Encounter (Signed)
Medication refill request: Imitrex Last AEX:  09/02/18 SM Next AEX: 01/09/20 SM Last MMG (if hormonal medication request): 2019 Refill authorized: 08/07/18 #[id number]. Today please advise.

## 2019-01-13 ENCOUNTER — Telehealth: Payer: Self-pay | Admitting: Hematology

## 2019-01-13 NOTE — Telephone Encounter (Signed)
Pt called regarding her fever and muscular and body pain since her zometa infusion last Monday 3/23. She denies sick contact and no family member being sick also. She has been taking tylenol as needed, still had low grade fever in the past few days, no fever this morning, overall feels better but not back to normal. No significant cough, dyspnea or other symptoms. Her reaction is likely related to Zometa infusion, I encourage her to drink more fluids, and use tylenol as needed for fever or pain. Her symptoms will likely improve and resolve within 2 weeks. She did call her PCP yesterday, has not decided about if she wants to be tested for COVID 19. I encouraged her to call us back if she has concerns.  Truitt Merle  01/13/2019

## 2019-01-16 ENCOUNTER — Encounter: Payer: Self-pay | Admitting: Hematology

## 2019-01-24 ENCOUNTER — Other Ambulatory Visit: Payer: Self-pay

## 2019-01-24 ENCOUNTER — Ambulatory Visit (INDEPENDENT_AMBULATORY_CARE_PROVIDER_SITE_OTHER): Payer: 59 | Admitting: Psychiatry

## 2019-01-24 ENCOUNTER — Encounter: Payer: Self-pay | Admitting: Psychiatry

## 2019-01-24 DIAGNOSIS — F4323 Adjustment disorder with mixed anxiety and depressed mood: Secondary | ICD-10-CM

## 2019-01-24 NOTE — Progress Notes (Signed)
[facility] Counselor/Therapist Progress Note  Patient ID: Tiffany Nash, MRN: [id number],    Date: 01/24/2019  Time Spent: 49 minutes   Treatment Type: Individual Therapy  Reported Symptoms: anxiety, frustration  Mental Status Exam:  Appearance:   NA     Behavior:  Appropriate  Motor:  Normal  Speech/Language:   Clear and Coherent  Affect:  Appropriate  Mood:  anxious and irritable  Thought process:  normal  Thought content:    WNL  Sensory/Perceptual disturbances:    WNL  Orientation:  oriented to person, place, time/date and situation  Attention:  Good  Concentration:  Good  Memory:  WNL  Fund of knowledge:   Good  Insight:    Good  Judgment:   Good  Impulse Control:  Good   Risk Assessment: Danger to Self:  No Self-injurious Behavior: No Danger to Others: No Duty to Warn:no Physical Aggression / Violence:No  Access to Firearms a concern: No  Gang Involvement:No   Subjective: I connected with patient by a video enabled telemedicine application or telephone, with their informed consent, and verified patient privacy and that I am speaking with the correct person using two identifiers.  I was located at [place] and patient at home. The client states that the sheltering in place order has not significantly impacted them at their home.  Her husband's work is considered essential so he is not working from home but in the office.  She does note that he comes home early. They have started seeing Dr. Agustina Caroli remotely.  She gave them a homework together that her husband would initiate.  He was to look into the client's eyes for 3 minutes.  "He never did."  The client was surprised that Dr. Sima Matas did not press him about why he did not do that homework.  She did have them do it in the office.  After 1 of their sessions she states "he lectured me, I cannot go into the past to be more.  I will not let go of Tiffany Nash she is a good Environmental consultant.  I  will not fire her." The client is frustrated as she is not getting more traction in the marriage counseling.  It has generated more conversations between her and her husband though.  I asked the client to make a list of the events that have caused her to feel insecure.  Submit these to Dr. Sima Matas via email and give a copy to her husband Tiffany Nash.  He denies that he remembers any of those events and is puzzled as to why his wife would be insecure.  I also asked the client to address at the next session the issue she has with her husband lying to her.  She states he had admitted at one point to lying to her thousands of times.  "Because it was just easier."  I asked the client if they could explore that topic with Dr. Sima Matas since that seems to be at the core of a lot of their issues.  She agreed.     Interventions: Assertiveness/Communication, Solution-Oriented/Positive Psychology and Insight-Oriented  Diagnosis:   ICD-10-CM   1. Adjustment disorder with mixed anxiety and depressed mood F43.23     Plan: assertiveness, boundaries, self care.  This record has been created using [employer].  Chart creation errors have been sought, but Tiffany Nash not always have been located and corrected. Such creation errors do not reflect on the standard  of medical care.   Tiffany Nash, [facility]

## 2019-02-07 ENCOUNTER — Ambulatory Visit: Payer: 59 | Admitting: Psychiatry

## 2019-03-23 ENCOUNTER — Ambulatory Visit: Payer: 59 | Admitting: Hematology

## 2019-03-23 ENCOUNTER — Other Ambulatory Visit: Payer: 59

## 2019-05-15 ENCOUNTER — Other Ambulatory Visit: Payer: Self-pay

## 2019-05-15 MED ORDER — SUMATRIPTAN SUCCINATE 100 MG PO TABS: ORAL_TABLET | ORAL | 1 refills | Status: DC

## 2019-05-15 NOTE — Telephone Encounter (Signed)
Medication refill request: sumatriptan 100mg  Last AEX:  09-02-18 Next AEX: 01-09-2020 Last MMG (if hormonal medication request): n/a Refill authorized: please approve if appropriate.

## 2019-06-10 ENCOUNTER — Other Ambulatory Visit: Payer: Self-pay | Admitting: Hematology

## 2019-06-16 NOTE — Progress Notes (Signed)
[place]   [phone] [phone]   Clinic Follow up Note   Patient Care Team: Lujean Amel, MD as PCP - General (Family Medicine) Excell Seltzer, MD as Consulting Physician (General Surgery) Truitt Merle, MD as Consulting Physician (Hematology) Kyung Rudd, MD as Consulting Physician (Radiation Oncology)  Date of Service:  06/22/2019  CHIEF COMPLAINT: Follow up left breast cancer  SUMMARY OF ONCOLOGIC HISTORY: Oncology History Overview Note  Breast cancer of upper-outer quadrant of left female breast [facility])   Staging form: Breast, AJCC 7th Edition   - Clinical stage from 04/15/2016: Stage IA (T1b, N0, M0) - Unsigned         Staging comments: Staged at breast conference on 7.5.17   - Pathologic stage from 04/22/2016: Stage IA (T1c, N0, cM0) - Unsigned      Breast cancer of upper-outer quadrant of left female breast ([place])  04/02/2016 Mammogram   1 cm oval massin the upper outer quadrant of left breast, suspicious for malignancy.    04/08/2016 Initial Diagnosis   Breast cancer of upper-outer quadrant of left female breast ([place])   04/08/2016 Initial Biopsy   Left breast core needle biopsy showed invasive ductal carcinoma and DCIS, grade 1-2.   04/08/2016 Receptors her2   Your 100% positive, strong staining, PR 70% positive, strong staining, HER-2 negative, Ki-67 15%   04/22/2016 Surgery   Left breast lumpectomy and SLN biopsy ([place])   04/22/2016 Pathology Results   Left breast lumpectomy showed invasive ductal carcinoma, grade 1, 1.2 cm, low-grade DCIS, surgical margins were negative, 3 sentinel lymph nodes negative.   04/22/2016 Oncotype testing   RS 17, low risk, predicts 10 year distant recurrence risk of 11% with tamoxifen   05/26/2016 - 07/13/2016 Radiation Therapy   Adjuvant breast radiation [facility]): Left Breast/ 50.4 Gy in 28 fx.  Boost/ 10 Gy in 5 fx   07/2016 -  Anti-estrogen oral therapy   Letrozole 2.5 mg daily. Planned  duration of therapy: 5-10 years.  Switched to Tamoxifen from 09/2018-12/2018 due to osteporosis, but due to poor tolerance we switched her to Letrozole again in 12/2018.    08/27/2016 Imaging   DEXA scan: Osteopenia. (T-score -2.4).    11/22/2017 Pathology Results   Diagnosis 11/22/17 Breast, left, needle core biopsy - DENSE STROMAL FIBROSIS, CONSISTENT WITH PRIOR SURGICAL PROCEDURE. - THERE IS NO EVIDENCE OF MALIGNANCY. - SEE COMMENT.      CURRENT THERAPY:  Letrozole 2.5 mg daily started in 07/2016. Switched to Tamoxifen from 09/2018-12/2018 due to osteoporosis, but due to poor tolerance we switched her to Letrozole again in 12/2018.   INTERVAL HISTORY:  Tiffany Nash is here for a follow up of left breast cancer. She was last seen by me 6 months ago. She presents to the clinic alone. She notes after her Zometa infusion it took her 2 weeks to recover. Since then she has no residual effects. She takes calcium, Vitamin and Magnesium. She notes she walks often but is starting to run. She notes he is back on letrozole and tolerating with little to no effect.     REVIEW OF SYSTEMS:   Constitutional: Denies fevers, chills or abnormal weight loss (+) trouble sleeping Eyes: Denies blurriness of vision Ears, nose, mouth, throat, and face: Denies mucositis or sore throat Respiratory: Denies cough, dyspnea or wheezes Cardiovascular: Denies palpitation, chest discomfort or lower extremity swelling Gastrointestinal:  Denies nausea, heartburn or change in bowel habits Skin: Denies abnormal skin rashes Lymphatics: Denies new lymphadenopathy  or easy bruising Neurological:Denies numbness, tingling or new weaknesses Behavioral/Psych: Mood is stable, no new changes  All other systems were reviewed with the patient and are negative.  MEDICAL HISTORY:  Past Medical History:  Diagnosis Date  . Abnormal Pap smear   . Breast cancer ([place])   . Elevated hemoglobin A1c June 2017   5.9  . GERD  (gastroesophageal reflux disease)   . Migraine    uses imitrex as needed  . Schatzki's ring    and hiatal hernia on EGD  . Shingles rash 07/2015    shingles on right rib cage   . TMJ (temporomandibular joint syndrome)   . Vertigo     SURGICAL HISTORY: Past Surgical History:  Procedure Laterality Date  . BREAST BIOPSY  1/12   fibrocystic changes   . BREAST LUMPECTOMY WITH RADIOACTIVE SEED AND SENTINEL LYMPH NODE BIOPSY Left 04/22/2016   Procedure: BREAST LUMPECTOMY WITH RADIOACTIVE SEED AND SENTINEL LYMPH NODE BIOPSY;  Surgeon: Excell Seltzer, MD;  Location: [place];  Service: General;  Laterality: Left;  . DILATION AND CURETTAGE OF UTERUS  1991  . ESOPHAGEAL DILATION    . GYNECOLOGIC CRYOSURGERY  1980s  . LAPAROSCOPIC CHOLECYSTECTOMY  1992  . WISDOM TOOTH EXTRACTION      I have reviewed the social history and family history with the patient and they are unchanged from previous note.  ALLERGIES:  is allergic to penicillins and tylox [oxycodone-acetaminophen].  MEDICATIONS:  Current Outpatient Medications  Medication Sig Dispense Refill  . Acetaminophen (TYLENOL PO) Take 1 tablet by mouth as needed. Reported on 04/15/2016    . Ascorbic Acid (VITAMIN C) 100 MG tablet Take 100 mg by mouth daily.    Marland Kitchen CALCIUM PO Take by mouth daily.    . Cholecalciferol (VITAMIN D PO) Take 2,000 Units by mouth every other day.     . ibuprofen (ADVIL,MOTRIN) 800 MG tablet Take 400 mg by mouth as needed for pain.     Marland Kitchen letrozole (FEMARA) 2.5 MG tablet TAKE 1 TABLET(2.5 MG) BY MOUTH DAILY 30 tablet 5  . Magnesium 100 MG CAPS Take 2 capsules by mouth daily.    . Psyllium Husk POWD 1 Dose by Does not apply route daily.    . SUMAtriptan (IMITREX) 100 MG tablet TAKE 1 TABLET(100 MG) BY MOUTH 1 TIME FOR 1 DOSE. MAY REPEAT IN 2 HOURS IF HEADACHE PERSISTS OR RECURS 9 tablet 1   No current facility-administered medications for this visit.     PHYSICAL EXAMINATION: ECOG PERFORMANCE  STATUS: 0 - Asymptomatic  Vitals:   06/22/19 1429  BP: 127/78  Pulse: 88  Resp: 18  Temp: 98.4 F (36.9 C)  SpO2: 99%   Filed Weights   06/22/19 1429  Weight: 151 lb 11.2 oz (68.8 kg)    GENERAL:alert, no distress and comfortable SKIN: skin color, texture, turgor are normal, no rashes or significant lesions EYES: normal, Conjunctiva are pink and non-injected, sclera clear  NECK: supple, thyroid normal size, non-tender, without nodularity LYMPH:  no palpable lymphadenopathy in the cervical, axillary  LUNGS: clear to auscultation and percussion with normal breathing effort HEART: regular rate & rhythm and no murmurs and no lower extremity edema ABDOMEN:abdomen soft, non-tender and normal bowel sounds Musculoskeletal:no cyanosis of digits and no clubbing  NEURO: alert & oriented x 3 with fluent speech, no focal motor/sensory deficits BREAST: S/p left lumpecomty: Surgical incision healed well. No palpable mass, nodules or adenopathy bilaterally. Breast exam benign.   LABORATORY DATA:  I have reviewed the data as listed CBC Latest Ref Rng & Units 06/22/2019 12/16/2018 07/22/2018  WBC 4.0 - 10.5 K/uL 9.0 5.6 5.9  Hemoglobin 12.0 - 15.0 g/dL 12.7 12.9 12.8  Hematocrit 36.0 - 46.0 % 38.3 40.0 39.2  Platelets 150 - 400 K/uL 315 287 274     CMP Latest Ref Rng & Units 06/22/2019 12/16/2018 07/22/2018  Glucose 70 - 99 mg/dL 115(H) 92 111(H)  BUN 6 - 20 mg/dL '13 15 15  '$ Creatinine 0.44 - 1.00 mg/dL 0.90 0.83 0.84  Sodium 135 - 145 mmol/L 142 141 141  Potassium 3.5 - 5.1 mmol/L 4.2 4.0 4.1  Chloride 98 - 111 mmol/L 106 108 103  CO2 22 - 32 mmol/L '27 24 28  '$ Calcium 8.9 - 10.3 mg/dL 9.7 9.4 10.0  Total Protein 6.5 - 8.1 g/dL 7.3 7.3 7.3  Total Bilirubin 0.3 - 1.2 mg/dL 0.3 0.3 0.5  Alkaline Phos 38 - 126 U/L 66 55 77  AST 15 - 41 U/L '20 20 19  '$ ALT 0 - 44 U/L '21 17 21      '$ RADIOGRAPHIC STUDIES: I have personally reviewed the radiological images as listed and agreed with the findings  in the report. No results found.   ASSESSMENT & PLAN:  Tiffany Nash is a 58 y.o. female with   1. Breast cancer of lower-outer quadrant of left female breast, invasive and in situ ductal carcinoma, grade 1, pT1cN0M0, stage IA, ER 100% positive, PR 70% positive, HER-2 negative, RS 17 -She was diagnosed in 03/2016. She is s/p left breast lumpectomy and adjuvant radiation.  -She has low risk based on the recurrence score, which predicts 10 year distant recurrence after 5 years of tamoxifen 11%. She does not need adjuvant chemotherapy -She started anti-estrogen therapy with letrozole in 07/2016. Due to Osteoporosis I switched her to Tamoxifen in 09/2018 but did not tolerate well due to insomnia and vaginal discharge. I switched her back to Letrozole in 12/2018.  -She is clinically doing well. Lab reviewed, her CBC and CMP are within normal limits except BG 115. Her physical exam and her 12/2018 left breast mammogram were unremarkable. There is no clinical concern for recurrence. -Continue surveillance. Next b/l mammogram in 06/2019. -Continue Letrozole for 5-7 years  -F/u in 6 months   2. Osteoporosis -Her 08/2018 DEXA from [facility] shows osteoporosis with Lowest T-score of -2.8 at [facility].  -She tried Zometa on 01/02/19 but had severe flu like reaction which lasted about 2 weeks. Will stop.  -continue weight bearing exercises and daily calcium and vitamin D -Next DEXA in 2021, if BMD gets worse, will consider Prolia   3. Joint Pain/Stiffness -secondary to letrozole -mild and tolerable    Plan -Lab and F/u in 6 months  -Continue Letrozole  -Mammogram in 06/2019   No problem-specific Assessment & Plan notes found for this encounter.   No orders of the defined types were placed in this encounter.  All questions were answered. The patient knows to call the clinic with any problems, questions or concerns. No barriers to learning was detected. I spent 15 minutes counseling the patient  face to face. The total time spent in the appointment was 20 minutes and more than 50% was on counseling and review of test results     Truitt Merle, MD 06/22/2019   I, Joslyn Devon, am acting as scribe for Truitt Merle, MD.   I have reviewed the above documentation for accuracy and completeness, and I agree  with the above.

## 2019-06-21 ENCOUNTER — Telehealth: Payer: Self-pay | Admitting: Hematology

## 2019-06-21 NOTE — Telephone Encounter (Signed)
Canceled appt per 9/9 sch message - unable to reach pt- left message to let pt know appt was cancelled .

## 2019-06-22 ENCOUNTER — Encounter: Payer: Self-pay | Admitting: Hematology

## 2019-06-22 ENCOUNTER — Inpatient Hospital Stay: Payer: 59 | Attending: Hematology

## 2019-06-22 ENCOUNTER — Other Ambulatory Visit: Payer: Self-pay

## 2019-06-22 ENCOUNTER — Inpatient Hospital Stay (HOSPITAL_BASED_OUTPATIENT_CLINIC_OR_DEPARTMENT_OTHER): Payer: 59 | Admitting: Hematology

## 2019-06-22 ENCOUNTER — Telehealth: Payer: Self-pay | Admitting: Hematology

## 2019-06-22 ENCOUNTER — Inpatient Hospital Stay: Payer: 59

## 2019-06-22 VITALS — BP 127/78 | HR 88 | Temp 98.4°F | Resp 18 | Ht 65.0 in | Wt 151.7 lb

## 2019-06-22 DIAGNOSIS — Z79811 Long term (current) use of aromatase inhibitors: Secondary | ICD-10-CM | POA: Insufficient documentation

## 2019-06-22 DIAGNOSIS — G4700 Insomnia, unspecified: Secondary | ICD-10-CM | POA: Insufficient documentation

## 2019-06-22 DIAGNOSIS — Z170 Estrogen receptor positive status [ER+]: Secondary | ICD-10-CM | POA: Diagnosis not present

## 2019-06-22 DIAGNOSIS — M816 Localized osteoporosis [Lequesne]: Secondary | ICD-10-CM | POA: Diagnosis not present

## 2019-06-22 DIAGNOSIS — N898 Other specified noninflammatory disorders of vagina: Secondary | ICD-10-CM | POA: Diagnosis not present

## 2019-06-22 DIAGNOSIS — C50412 Malignant neoplasm of upper-outer quadrant of left female breast: Secondary | ICD-10-CM

## 2019-06-22 DIAGNOSIS — Z880 Allergy status to penicillin: Secondary | ICD-10-CM | POA: Insufficient documentation

## 2019-06-22 DIAGNOSIS — M810 Age-related osteoporosis without current pathological fracture: Secondary | ICD-10-CM | POA: Diagnosis not present

## 2019-06-22 DIAGNOSIS — Z923 Personal history of irradiation: Secondary | ICD-10-CM | POA: Diagnosis not present

## 2019-06-22 DIAGNOSIS — Z79899 Other long term (current) drug therapy: Secondary | ICD-10-CM | POA: Insufficient documentation

## 2019-06-22 DIAGNOSIS — M2550 Pain in unspecified joint: Secondary | ICD-10-CM | POA: Diagnosis not present

## 2019-06-22 LAB — CBC WITH DIFFERENTIAL/PLATELET
Abs Immature Granulocytes: 0.01 10*3/uL (ref 0.00–0.07)
Basophils Absolute: 0 10*3/uL (ref 0.0–0.1)
Basophils Relative: 0 %
Eosinophils Absolute: 0.3 10*3/uL (ref 0.0–0.5)
Eosinophils Relative: 3 %
HCT: 38.3 % (ref 36.0–46.0)
Hemoglobin: 12.7 g/dL (ref 12.0–15.0)
Immature Granulocytes: 0 %
Lymphocytes Relative: 19 %
Lymphs Abs: 1.7 10*3/uL (ref 0.7–4.0)
MCH: 32.1 pg (ref 26.0–34.0)
MCHC: 33.2 g/dL (ref 30.0–36.0)
MCV: 96.7 fL (ref 80.0–100.0)
Monocytes Absolute: 0.6 10*3/uL (ref 0.1–1.0)
Monocytes Relative: 6 %
Neutro Abs: 6.4 10*3/uL (ref 1.7–7.7)
Neutrophils Relative %: 72 %
Platelets: 315 10*3/uL (ref 150–400)
RBC: 3.96 MIL/uL (ref 3.87–5.11)
RDW: 12.7 % (ref 11.5–15.5)
WBC: 9 10*3/uL (ref 4.0–10.5)
nRBC: 0 % (ref 0.0–0.2)

## 2019-06-22 LAB — COMPREHENSIVE METABOLIC PANEL
ALT: 21 U/L (ref 0–44)
AST: 20 U/L (ref 15–41)
Albumin: 4.5 g/dL (ref 3.5–5.0)
Alkaline Phosphatase: 66 U/L (ref 38–126)
Anion gap: 9 (ref 5–15)
BUN: 13 mg/dL (ref 6–20)
CO2: 27 mmol/L (ref 22–32)
Calcium: 9.7 mg/dL (ref 8.9–10.3)
Chloride: 106 mmol/L (ref 98–111)
Creatinine, Ser: 0.9 mg/dL (ref 0.44–1.00)
GFR calc Af Amer: >60 mL/min (ref >60)
GFR calc non Af Amer: >60 mL/min (ref >60)
Glucose, Bld: 115 mg/dL — ABNORMAL HIGH (ref 70–99)
Potassium: 4.2 mmol/L (ref 3.5–5.1)
Sodium: 142 mmol/L (ref 135–145)
Total Bilirubin: 0.3 mg/dL (ref 0.3–1.2)
Total Protein: 7.3 g/dL (ref 6.5–8.1)

## 2019-06-22 NOTE — Telephone Encounter (Signed)
Scheduled appt per 9/10 los - pt aware of appt - per pt no print out needed - my chart active

## 2019-07-17 ENCOUNTER — Other Ambulatory Visit: Payer: Self-pay | Admitting: Obstetrics & Gynecology

## 2019-07-17 NOTE — Telephone Encounter (Signed)
Medication refill request: Imitrex Last AEX:  09/02/2018 SM Next AEX: 01/09/2020 Last MMG (if hormonal medication request): 01/05/2019 BIRADS 2 Benign Denisty C Refill authorized: Pending #9 with 4 refills if appropriate. Pt would like refills to last until her next AEX if possible. Please advise.

## 2019-07-17 NOTE — Telephone Encounter (Signed)
This is a duplicate.  Please see previous note.

## 2019-07-17 NOTE — Telephone Encounter (Addendum)
Patient request that she get refills of this medication until her next aex 01/09/20.

## 2019-07-20 ENCOUNTER — Encounter: Payer: Self-pay | Admitting: Obstetrics & Gynecology

## 2019-10-20 ENCOUNTER — Ambulatory Visit: Payer: 59 | Attending: Internal Medicine

## 2019-10-20 DIAGNOSIS — Z20822 Contact with and (suspected) exposure to covid-19: Secondary | ICD-10-CM

## 2019-10-21 LAB — NOVEL CORONAVIRUS, NAA: SARS-CoV-2, NAA: NOT DETECTED

## 2019-10-25 ENCOUNTER — Telehealth: Payer: Self-pay | Admitting: *Deleted

## 2019-10-25 NOTE — Telephone Encounter (Signed)
Message left to return call to [facility] at [phone] to review BMD results.

## 2019-10-25 NOTE — Telephone Encounter (Signed)
Patient returned call. BMD results reviewed with patient and she verbalized understanding.  Encounter closed.

## 2019-11-03 ENCOUNTER — Ambulatory Visit (INDEPENDENT_AMBULATORY_CARE_PROVIDER_SITE_OTHER): Payer: 59 | Admitting: Certified Nurse Midwife

## 2019-11-03 ENCOUNTER — Other Ambulatory Visit: Payer: Self-pay

## 2019-11-03 ENCOUNTER — Telehealth: Payer: Self-pay | Admitting: Obstetrics & Gynecology

## 2019-11-03 ENCOUNTER — Encounter: Payer: Self-pay | Admitting: Certified Nurse Midwife

## 2019-11-03 VITALS — BP 120/70 | HR 68 | Temp 97.0°F | Resp 16 | Wt 155.0 lb

## 2019-11-03 DIAGNOSIS — N390 Urinary tract infection, site not specified: Secondary | ICD-10-CM

## 2019-11-03 DIAGNOSIS — N951 Menopausal and female climacteric states: Secondary | ICD-10-CM

## 2019-11-03 DIAGNOSIS — R319 Hematuria, unspecified: Secondary | ICD-10-CM | POA: Diagnosis not present

## 2019-11-03 LAB — POCT URINALYSIS DIPSTICK
Bilirubin, UA: NEGATIVE
Glucose, UA: NEGATIVE
Ketones, UA: NEGATIVE
Nitrite, UA: NEGATIVE
Protein, UA: NEGATIVE
Urobilinogen, UA: NEGATIVE E.U./dL — AB
pH, UA: 5 (ref 5.0–8.0)

## 2019-11-03 MED ORDER — NITROFURANTOIN MONOHYD MACRO 100 MG PO CAPS: 100.0000 mg | ORAL_CAPSULE | Freq: Two times a day (BID) | ORAL | 0 refills | Status: DC

## 2019-11-03 MED ORDER — PHENAZOPYRIDINE HCL 100 MG PO TABS: 100.0000 mg | ORAL_TABLET | Freq: Three times a day (TID) | ORAL | 0 refills | Status: DC | PRN

## 2019-11-03 NOTE — Progress Notes (Signed)
59 y.o. Married Caucasian female [phone] here with complaint of UTI, with onset  on this am.. Patient complaining of urinary frequency/urgency/ and pain with urination at end of stream only.. Patient denies fever, chills, nausea or back pain. No new personal products. Patient feels could be related to sexual activity. Denies any vaginal symptoms other than vaginal dryness.  Menopausal no vaginal bleeding.. Patient trying to drink adequate water intake. No other health issues today.  Review of Systems  Constitutional: Negative.   HENT: Negative.   Eyes: Negative.   Respiratory: Negative.   Cardiovascular: Negative.   Gastrointestinal: Negative.   Genitourinary: Positive for dysuria and frequency.  Musculoskeletal: Negative.   Skin: Negative.   Neurological: Negative.   Endo/Heme/Allergies: Negative.   Psychiatric/Behavioral: Negative.     O: Healthy female WDWN Affect: Normal, orientation x 3 Skin : warm and dry CVAT: negative bilateral Abdomen: positive for suprapubic tenderness  Pelvic exam: External genital area: normal, no lesions Bladder,Urethra tender, Urethral meatus: tender, red Vagina: scant vaginal discharge,vaginal dryness appearance noted Cervix: normal, non tender Uterus:normal,non tender Adnexa: normal non tender, no fullness or masses  poct urine-rbc tr, wbc tr  A: UTI Normal pelvic exam  P: Reviewed findings of UTI and need for treatment. [id number] 100 mg bid x 7 [id number] micro, culture  Reviewed warning signs and symptoms of UTI and need to advise if occurring. Encouraged to limit soda, tea, and coffee. Encouraged to increase water intake. Questions answered. Discussed emptying bladder before and after sexual activity to help prevent reoccurrence. Also using vaginal moisture for sexual activity can help in addition to moisture to vagina 2-3 times weekly. Questions addressed..   RV prn

## 2019-11-03 NOTE — Telephone Encounter (Signed)
Spoke with patient. Patient reports urinary frequency, urgency, dysuria, pressure and voiding small amounts. Symptoms started last night, increased fluids and cranberry juice, no change. Denies flank pain, fever/chills, N/V, blood in urine, vag d/c or odor. [id number] prescreen negative, precautions reviewed. OV scheduled for today at 4pm with Melvia Heaps, CNM.   Last AEX 09/02/18 with Dr. Sabra Heck  Routing to provider for final review. Patient is agreeable to disposition. Will close encounter.  Cc: Dr. Sabra Heck

## 2019-11-03 NOTE — Telephone Encounter (Signed)
Patient is having uti symptoms. 

## 2019-11-04 LAB — URINALYSIS, MICROSCOPIC ONLY
Bacteria, UA: NONE SEEN
Casts: NONE SEEN /lpf
Epithelial Cells (non renal): NONE SEEN /hpf (ref 0–10)
WBC, UA: >30 /hpf — AB (ref 0–5)

## 2019-11-04 NOTE — Patient Instructions (Signed)
Urinary Tract Infection, Adult A urinary tract infection (UTI) is an infection of any part of the urinary tract. The urinary tract includes:  The kidneys.  The ureters.  The bladder.  The urethra. These organs make, store, and get rid of pee (urine) in the body. What are the causes? This is caused by germs (bacteria) in your genital area. These germs grow and cause swelling (inflammation) of your urinary tract. What increases the risk? You are more likely to develop this condition if:  You have a small, thin tube (catheter) to drain pee.  You cannot control when you pee or poop (incontinence).  You are female, and: ? You use these methods to prevent pregnancy:  A medicine that kills sperm (spermicide).  A device that blocks sperm (diaphragm). ? You have low levels of a female hormone (estrogen). ? You are pregnant.  You have genes that add to your risk.  You are sexually active.  You take antibiotic medicines.  You have trouble peeing because of: ? A prostate that is bigger than normal, if you are female. ? A blockage in the part of your body that drains pee from the bladder (urethra). ? A kidney stone. ? A nerve condition that affects your bladder (neurogenic bladder). ? Not getting enough to drink. ? Not peeing often enough.  You have other conditions, such as: ? Diabetes. ? A weak disease-fighting system (immune system). ? Sickle cell disease. ? Gout. ? Injury of the spine. What are the signs or symptoms? Symptoms of this condition include:  Needing to pee right away (urgently).  Peeing often.  Peeing small amounts often.  Pain or burning when peeing.  Blood in the pee.  Pee that smells bad or not like normal.  Trouble peeing.  Pee that is cloudy.  Fluid coming from the vagina, if you are female.  Pain in the belly or lower back. Other symptoms include:  Throwing up (vomiting).  No urge to eat.  Feeling mixed up (confused).  Being  tired and grouchy (irritable).  A fever.  Watery poop (diarrhea). How is this treated? This condition may be treated with:  Antibiotic medicine.  Other medicines.  Drinking enough water. Follow these instructions at home:  Medicines  Take over-the-counter and prescription medicines only as told by your doctor.  If you were prescribed an antibiotic medicine, take it as told by your doctor. Do not stop taking it even if you start to feel better. General instructions  Make sure you: ? Pee until your bladder is empty. ? Do not hold pee for a long time. ? Empty your bladder after sex. ? Wipe from front to back after pooping if you are a female. Use each tissue one time when you wipe.  Drink enough fluid to keep your pee pale yellow.  Keep all follow-up visits as told by your doctor. This is important. Contact a doctor if:  You do not get better after 1-2 days.  Your symptoms go away and then come back. Get help right away if:  You have very bad back pain.  You have very bad pain in your lower belly.  You have a fever.  You are sick to your stomach (nauseous).  You are throwing up. Summary  A urinary tract infection (UTI) is an infection of any part of the urinary tract.  This condition is caused by germs in your genital area.  There are many risk factors for a UTI. These include having a small,  thin tube to drain pee and not being able to control when you pee or poop.  Treatment includes antibiotic medicines for germs.  Drink enough fluid to keep your pee pale yellow. This information is not intended to replace advice given to you by your health care provider. Make sure you discuss any questions you have with your health care provider. Document Revised: 09/15/2018 Document Reviewed: 04/07/2018 Elsevier Patient Education  2020 [place] [employer].  Atrophic Vaginitis Atrophic vaginitis is a condition in which the tissues that line the vagina become dry and thin.  This condition occurs in women who have stopped having their period. It is caused by a drop in a female hormone (estrogen). This hormone helps:  To keep the vagina moist.  To make a clear fluid. This clear fluid helps: ? To make the vagina ready for sex. ? To protect the vagina from infection. If the lining of the vagina is dry and thin, it may cause irritation, burning, or itchiness. It may also:  Make sex painful.  Make an exam of your vagina painful.  Cause bleeding.  Make you lose interest in sex.  Cause a burning feeling when you pee (urinate).  Cause a brown or yellow fluid to come from your vagina. Some women do not have symptoms. Follow these instructions at home: Medicines  Take over-the-counter and prescription medicines only as told by your doctor.  Do not use herbs or other medicines unless your doctor says it is okay.  Use medicines for for dryness. These include: ? Oils to make the vagina soft. ? Creams. ? Moisturizers. General instructions  Do not douche.  Do not use products that can make your vagina dry. These include: ? Scented sprays. ? Scented tampons. ? Scented soaps.  Sex can help increase blood flow and soften the tissue in the vagina. If it hurts to have sex: ? Tell your partner. ? Use products to make sex more comfortable. Use these only as told by your doctor. Contact a doctor if you:  Have discharge from the vagina that is different than usual.  Have a bad smell coming from your vagina.  Have new symptoms.  Do not get better.  Get worse. Summary  Atrophic vaginitis is a condition in which the lining of the vagina becomes dry and thin.  This condition affects women who have stopped having their periods.  Treatment may include using products that help make the vagina soft.  Call a doctor if do not get better with treatment. This information is not intended to replace advice given to you by your health care provider. Make sure  you discuss any questions you have with your health care provider. Document Revised: 10/11/2017 Document Reviewed: 10/11/2017 Elsevier Patient Education  2020 [employer].

## 2019-11-05 LAB — URINE CULTURE: Organism ID, Bacteria: NO GROWTH

## 2019-12-14 ENCOUNTER — Telehealth: Payer: Self-pay | Admitting: Hematology

## 2019-12-14 NOTE — Telephone Encounter (Signed)
Called patient to inform pt, that he appt has been moved due to MD being on call.  Left a voice message of the new appt date and time.

## 2019-12-18 ENCOUNTER — Other Ambulatory Visit: Payer: Self-pay | Admitting: Hematology

## 2019-12-20 ENCOUNTER — Inpatient Hospital Stay: Payer: 59 | Admitting: Hematology

## 2019-12-20 ENCOUNTER — Inpatient Hospital Stay: Payer: 59

## 2019-12-20 NOTE — Progress Notes (Addendum)
[place]   [phone] [phone]   Clinic Follow up Note   Patient Care Team: Lujean Amel, MD as PCP - General (Family Medicine) Excell Seltzer, MD (Inactive) as Consulting Physician (General Surgery) Truitt Merle, MD as Consulting Physician (Hematology) Kyung Rudd, MD as Consulting Physician (Radiation Oncology) 12/21/2019  CHIEF COMPLAINT: F/u left breast cancer   SUMMARY OF ONCOLOGIC HISTORY: Oncology History Overview Note  Breast cancer of upper-outer quadrant of left female breast [name])   Staging form: Breast, AJCC 7th Edition   - Clinical stage from 04/15/2016: Stage IA (T1b, N0, M0) - Unsigned         Staging comments: Staged at breast conference on 7.5.17   - Pathologic stage from 04/22/2016: Stage IA (T1c, N0, cM0) - Unsigned      Breast cancer of upper-outer quadrant of left female breast ([place])  04/02/2016 Mammogram   1 cm oval massin the upper outer quadrant of left breast, suspicious for malignancy.    04/08/2016 Initial Diagnosis   Breast cancer of upper-outer quadrant of left female breast ([place])   04/08/2016 Initial Biopsy   Left breast core needle biopsy showed invasive ductal carcinoma and DCIS, grade 1-2.   04/08/2016 Receptors her2   Your 100% positive, strong staining, PR 70% positive, strong staining, HER-2 negative, Ki-67 15%   04/22/2016 Surgery   Left breast lumpectomy and SLN biopsy ([place])   04/22/2016 Pathology Results   Left breast lumpectomy showed invasive ductal carcinoma, grade 1, 1.2 cm, low-grade DCIS, surgical margins were negative, 3 sentinel lymph nodes negative.   04/22/2016 Oncotype testing   RS 17, low risk, predicts 10 year distant recurrence risk of 11% with tamoxifen   05/26/2016 - 07/13/2016 Radiation Therapy   Adjuvant breast radiation [facility]): Left Breast/ 50.4 Gy in 28 fx.  Boost/ 10 Gy in 5 fx   07/2016 -  Anti-estrogen oral therapy   Letrozole 2.5 mg daily. Planned duration of  therapy: 5-10 years.  Switched to Tamoxifen from 09/2018-12/2018 due to osteporosis, but due to poor tolerance we switched her to Letrozole again in 12/2018.    08/27/2016 Imaging   DEXA scan: Osteopenia. (T-score -2.4).    11/22/2017 Pathology Results   Diagnosis 11/22/17 Breast, left, needle core biopsy - DENSE STROMAL FIBROSIS, CONSISTENT WITH PRIOR SURGICAL PROCEDURE. - THERE IS NO EVIDENCE OF MALIGNANCY. - SEE COMMENT.     CURRENT THERAPY:  Letrozole 2.5 mg daily started in 07/2016. Switched to Tamoxifen from 09/2018-12/2018 due to osteoporosis, but due to poor tolerance we switched her to Letrozole again in 12/2018.  INTERVAL HISTORY:Tiffany Nash returns for f/u as scheduled. She was last seen 06/22/2019. She had a mammogram on 07/20/19 that was negative. She is doing well. Denies changes in her health in the interim. Energy and appetite are normal for her. She had UTI a month ago. Denies changes in bowel habits. Denies rectal of GYN bleeding. Denies recent fever, chills, cough, chest pain, dyspnea, or edema. Denies concerns in her breast such as new lump/mass or nipple discharge. She continues letrozole. Denies significant hot flashes or joint pain.    MEDICAL HISTORY:  Past Medical History:  Diagnosis Date  . Abnormal Pap smear   . Breast cancer ([place])   . Elevated hemoglobin A1c June 2017   5.9  . GERD (gastroesophageal reflux disease)   . Migraine    uses imitrex as needed  . Osteopenia   . Osteoporosis   . Schatzki's ring    and hiatal  hernia on EGD  . Shingles rash 07/2015    shingles on right rib cage   . TMJ (temporomandibular joint syndrome)   . Vertigo     SURGICAL HISTORY: Past Surgical History:  Procedure Laterality Date  . BREAST BIOPSY  1/12   fibrocystic changes   . BREAST LUMPECTOMY WITH RADIOACTIVE SEED AND SENTINEL LYMPH NODE BIOPSY Left 04/22/2016   Procedure: BREAST LUMPECTOMY WITH RADIOACTIVE SEED AND SENTINEL LYMPH NODE BIOPSY;  Surgeon: Excell Seltzer, MD;  Location: [facility];  Service: General;  Laterality: Left;  . DILATION AND CURETTAGE OF UTERUS  1991  . ESOPHAGEAL DILATION    . GYNECOLOGIC CRYOSURGERY  1980s  . LAPAROSCOPIC CHOLECYSTECTOMY  1992  . WISDOM TOOTH EXTRACTION      I have reviewed the social history and family history with the patient and they are unchanged from previous note.  ALLERGIES:  is allergic to penicillins and tylox [oxycodone-acetaminophen].  MEDICATIONS:  Current Outpatient Medications  Medication Sig Dispense Refill  . Ascorbic Acid (VITAMIN C) 100 MG tablet Take 100 mg by mouth daily.    Marland Kitchen CALCIUM PO Take by mouth daily.    . Cholecalciferol (VITAMIN D PO) Take 2,000 Units by mouth every other day.     . ibuprofen (ADVIL,MOTRIN) 800 MG tablet Take 400 mg by mouth as needed for pain.     Marland Kitchen letrozole (FEMARA) 2.5 MG tablet TAKE 1 TABLET(2.5 MG) BY MOUTH DAILY 30 tablet 5  . Magnesium 100 MG CAPS Take 2 capsules by mouth daily.    . Multiple Vitamin (MULTIVITAMIN PO) Take by mouth.    . nitrofurantoin, macrocrystal-monohydrate, (MACROBID) 100 MG capsule Take 1 capsule (100 mg total) by mouth 2 (two) times daily. Take one capsule BID x 10 days 14 capsule 0  . phenazopyridine (PYRIDIUM) 100 MG tablet Take 1 tablet (100 mg total) by mouth 3 (three) times daily as needed for pain. 10 tablet 0  . SUMAtriptan (IMITREX) 100 MG tablet TAKE 1 TABLET BY MOUTH ONE TIME FOR 1 DOSE,(MAY REPEAT IN 2 HOURS IF HEADACHE PERSISTS OR RECURS) 9 tablet 4   No current facility-administered medications for this visit.    PHYSICAL EXAMINATION: ECOG PERFORMANCE STATUS: 0 - Asymptomatic  Vitals:   12/21/19 1143  BP: 115/69  Pulse: 73  Resp: 18  Temp: 98 F (36.7 C)  SpO2: 99%   Filed Weights   12/21/19 1143  Weight: 151 lb 4.8 oz (68.6 kg)    GENERAL:alert, no distress and comfortable SKIN: no rash  EYES: sclera clear NECK: without mass  LYMPH:  no palpable cervical or supraclavicular  lymphadenopathy  LUNGS: clear with normal breathing effort HEART: regular rate & rhythm, no lower extremity edema ABDOMEN: abdomen soft, non-tender and normal bowel sounds NEURO: alert & oriented x 3 with fluent speech Breast: s/p left lumpectomy and radiation. Incisions completely healed. No nipple discharge or inversion. No palpable mass in either breast or axilla that I could appreciate   LABORATORY DATA:  I have reviewed the data as listed CBC Latest Ref Rng & Units 12/21/2019 06/22/2019 12/16/2018  WBC 4.0 - 10.5 K/uL 6.5 9.0 5.6  Hemoglobin 12.0 - 15.0 g/dL 12.4 12.7 12.9  Hematocrit 36.0 - 46.0 % 37.6 38.3 40.0  Platelets 150 - 400 K/uL 297 315 287     CMP Latest Ref Rng & Units 12/21/2019 06/22/2019 12/16/2018  Glucose 70 - 99 mg/dL 119(H) 115(H) 92  BUN 6 - 20 mg/dL 16 13 15   Creatinine  0.44 - 1.00 mg/dL 0.82 0.90 0.83  Sodium 135 - 145 mmol/L 140 142 141  Potassium 3.5 - 5.1 mmol/L 4.4 4.2 4.0  Chloride 98 - 111 mmol/L 104 106 108  CO2 22 - 32 mmol/L 27 27 24   Calcium 8.9 - 10.3 mg/dL 9.1 9.7 9.4  Total Protein 6.5 - 8.1 g/dL 7.0 7.3 7.3  Total Bilirubin 0.3 - 1.2 mg/dL 0.4 0.3 0.3  Alkaline Phos 38 - 126 U/L 68 66 55  AST 15 - 41 U/L 22 20 20   ALT 0 - 44 U/L 22 21 17       RADIOGRAPHIC STUDIES: I have personally reviewed the radiological images as listed and agreed with the findings in the report. No results found.   ASSESSMENT & PLAN: Tiffany Nash is a 59 y.o. female with   1. Breast cancer of lower-outer quadrant of left female breast, invasive and in situ ductal carcinoma, grade 1, pT1cN0M0, stage IA, 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 S/p left breast lumpectomy and adjuvantradiation.  -Oncotype RS showed low risk, 17, which predicts 10 year distant recurrence after 5 years of tamoxifen 11%. Chemotherapy was not recommended  -She started anti-estrogen therapy with letrozole in 07/2016.Due to Osteoporosis she was switched  to Tamoxifen in 09/2018 but did not tolerate well due to insomnia and vaginal discharge.Switched back to Letrozole in 12/2018.  -Tiffany Nash is clinically doing well. CBC and CMP WNL. Physical exam is unremarkable. Mammogram from 07/2019 is negative. No clinical concern for recurrence.  -She is interested in screening breast MRI. Her breast density is category C. Also, her initial breast cancer was not detected on mammogram 3 months prior to her diagnosis. I have placed the order, will see if insurance approves.  -she is tolerating letrozole well, without significant toxicities, continue for 5-7 years  -Continue surveillance -return for lab and f/u in 6 months   2. Osteoporosis -Her 08/2018 DEXA from [place] T-score of -2.8 at [facility]. -She tried Zometa on 01/02/19 but had severe flu like reaction with fever lasting 2 weeks. This was discontinued   -continue weight bearing exercisesand dailycalcium and vitamin D -repeat DEXA in 10/17/19 showed improved T score at [facility] to -2.4  3. Family history  -she updated me today that one of her brothers and MGF had pancreatic cancer, and one of her sisters had a pancreatic mass that was benign.  -I have discussed with our genetic counselor, she meets criteria for testing and is interested. She has 4 children, and she is 1 of 5 siblings.  -I referred her   PLAN: -Labs, mammogram, DEXA reviewed -Continue letrozole, surveillance -Annual mammogram due 07/2020 -screening breast MRI if insurance approves, to be done in 01/2020  -Referral to genetics for personal h/o breast cancer and family history of pancreas cancer  -If MRI and genetics negative, f/u in 6 months or sooner if needed   Orders Placed This Encounter  Procedures  . MR BREAST BILATERAL W WO CONTRAST INC CAD    Standing Status:   Future    Standing Expiration Date:   02/19/2021    Order Specific Question:   ** REASON FOR EXAM (FREE TEXT)    Answer:    breast cancer screening, breast density cat C, h/o left breast cancer 2017    Order Specific Question:   If indicated for the ordered procedure, I authorize the administration of contrast media per Radiology protocol    Answer:  Yes    Order Specific Question:   What is the patient's sedation requirement?    Answer:   No Sedation    Order Specific Question:   Does the patient have a pacemaker or implanted devices?    Answer:   No    Order Specific Question:   Radiology Contrast Protocol - do NOT remove file path    Answer:   [email or web address]    Order Specific Question:   Preferred imaging location?    Answer:   [facility] (table limit-550lbs)  . Ambulatory referral to Genetics    Referral Priority:   Routine    Referral Type:   Consultation    Referral Reason:   Specialty Services Required    Number of Visits Requested:   1   All questions were answered. The patient knows to call the clinic with any problems, questions or concerns. No barriers to learning was detected.     Tiffany Feeling, Tiffany Nash 12/21/19

## 2019-12-21 ENCOUNTER — Encounter: Payer: Self-pay | Admitting: Nurse Practitioner

## 2019-12-21 ENCOUNTER — Other Ambulatory Visit: Payer: Self-pay

## 2019-12-21 ENCOUNTER — Inpatient Hospital Stay: Payer: 59 | Attending: Hematology

## 2019-12-21 ENCOUNTER — Inpatient Hospital Stay (HOSPITAL_BASED_OUTPATIENT_CLINIC_OR_DEPARTMENT_OTHER): Payer: 59 | Admitting: Nurse Practitioner

## 2019-12-21 VITALS — BP 115/69 | HR 73 | Temp 98.0°F | Resp 18 | Ht 65.0 in | Wt 151.3 lb

## 2019-12-21 DIAGNOSIS — M818 Other osteoporosis without current pathological fracture: Secondary | ICD-10-CM | POA: Diagnosis not present

## 2019-12-21 DIAGNOSIS — Z923 Personal history of irradiation: Secondary | ICD-10-CM | POA: Diagnosis not present

## 2019-12-21 DIAGNOSIS — C50412 Malignant neoplasm of upper-outer quadrant of left female breast: Secondary | ICD-10-CM | POA: Insufficient documentation

## 2019-12-21 DIAGNOSIS — Z880 Allergy status to penicillin: Secondary | ICD-10-CM | POA: Diagnosis not present

## 2019-12-21 DIAGNOSIS — Z170 Estrogen receptor positive status [ER+]: Secondary | ICD-10-CM

## 2019-12-21 DIAGNOSIS — Z79899 Other long term (current) drug therapy: Secondary | ICD-10-CM | POA: Diagnosis not present

## 2019-12-21 DIAGNOSIS — T451X5A Adverse effect of antineoplastic and immunosuppressive drugs, initial encounter: Secondary | ICD-10-CM | POA: Diagnosis not present

## 2019-12-21 DIAGNOSIS — Z800 Family history of malignant neoplasm of digestive organs: Secondary | ICD-10-CM | POA: Diagnosis not present

## 2019-12-21 DIAGNOSIS — Z79811 Long term (current) use of aromatase inhibitors: Secondary | ICD-10-CM | POA: Insufficient documentation

## 2019-12-21 DIAGNOSIS — M816 Localized osteoporosis [Lequesne]: Secondary | ICD-10-CM

## 2019-12-21 LAB — CBC WITH DIFFERENTIAL/PLATELET
Abs Immature Granulocytes: 0.01 10*3/uL (ref 0.00–0.07)
Basophils Absolute: 0.1 10*3/uL (ref 0.0–0.1)
Basophils Relative: 1 %
Eosinophils Absolute: 0.3 10*3/uL (ref 0.0–0.5)
Eosinophils Relative: 4 %
HCT: 37.6 % (ref 36.0–46.0)
Hemoglobin: 12.4 g/dL (ref 12.0–15.0)
Immature Granulocytes: 0 %
Lymphocytes Relative: 26 %
Lymphs Abs: 1.7 10*3/uL (ref 0.7–4.0)
MCH: 31.9 pg (ref 26.0–34.0)
MCHC: 33 g/dL (ref 30.0–36.0)
MCV: 96.7 fL (ref 80.0–100.0)
Monocytes Absolute: 0.5 10*3/uL (ref 0.1–1.0)
Monocytes Relative: 8 %
Neutro Abs: 4 10*3/uL (ref 1.7–7.7)
Neutrophils Relative %: 61 %
Platelets: 297 10*3/uL (ref 150–400)
RBC: 3.89 MIL/uL (ref 3.87–5.11)
RDW: 13 % (ref 11.5–15.5)
WBC: 6.5 10*3/uL (ref 4.0–10.5)
nRBC: 0 % (ref 0.0–0.2)

## 2019-12-21 LAB — VITAMIN D 25 HYDROXY (VIT D DEFICIENCY, FRACTURES): Vit D, 25-Hydroxy: 34.76 ng/mL (ref 30–100)

## 2019-12-21 LAB — COMPREHENSIVE METABOLIC PANEL
ALT: 22 U/L (ref 0–44)
AST: 22 U/L (ref 15–41)
Albumin: 4.2 g/dL (ref 3.5–5.0)
Alkaline Phosphatase: 68 U/L (ref 38–126)
Anion gap: 9 (ref 5–15)
BUN: 16 mg/dL (ref 6–20)
CO2: 27 mmol/L (ref 22–32)
Calcium: 9.1 mg/dL (ref 8.9–10.3)
Chloride: 104 mmol/L (ref 98–111)
Creatinine, Ser: 0.82 mg/dL (ref 0.44–1.00)
GFR calc Af Amer: >60 mL/min (ref >60)
GFR calc non Af Amer: >60 mL/min (ref >60)
Glucose, Bld: 119 mg/dL — ABNORMAL HIGH (ref 70–99)
Potassium: 4.4 mmol/L (ref 3.5–5.1)
Sodium: 140 mmol/L (ref 135–145)
Total Bilirubin: 0.4 mg/dL (ref 0.3–1.2)
Total Protein: 7 g/dL (ref 6.5–8.1)

## 2019-12-21 NOTE — Addendum Note (Signed)
Addended by: Alla Feeling on: 12/21/2019 03:06 PM   Modules accepted: Orders

## 2019-12-22 ENCOUNTER — Encounter: Payer: Self-pay | Admitting: Obstetrics & Gynecology

## 2019-12-22 ENCOUNTER — Ambulatory Visit (INDEPENDENT_AMBULATORY_CARE_PROVIDER_SITE_OTHER): Payer: 59 | Admitting: Obstetrics & Gynecology

## 2019-12-22 ENCOUNTER — Telehealth: Payer: Self-pay | Admitting: Nurse Practitioner

## 2019-12-22 VITALS — BP 112/68 | HR 76 | Temp 97.2°F | Resp 10 | Ht 65.75 in | Wt 151.0 lb

## 2019-12-22 DIAGNOSIS — Z01419 Encounter for gynecological examination (general) (routine) without abnormal findings: Secondary | ICD-10-CM

## 2019-12-22 DIAGNOSIS — Z1211 Encounter for screening for malignant neoplasm of colon: Secondary | ICD-10-CM | POA: Diagnosis not present

## 2019-12-22 NOTE — Progress Notes (Signed)
59 y.o. S8P1031 Married White or Caucasian female here for annual exam.  Doing well.    She is going to have genetic testing with [facility].  Brother was diagnosed with pancreatic cancer.  He is one year older than pt.    Discussed with pt Covid vaccination.  She's not sure she is going to have this done.    Denies vaginal bleeding.    Patient's last menstrual period was 05/12/2012.          Sexually active: Yes.    The current method of family planning is vasectomy.    Exercising: Yes.    [employer] Smoker:  no  Health Maintenance: Pap:   09/02/18 Neg:Neg HR HPV  01/11/15 Neg:Neg HR HPV  01/05/14 negative History of abnormal Pap:  yes MMG:  07/20/19 BIRADS 2 benign/density c Colonoscopy:  2011, Dr. Cristina Gong BMD:   10/17/19 Osteopenia, -2.4  TDaP:  12/14/11 Pneumonia vaccine(s):  n/a Shingrix:   no Hep C testing: 03/24/16 Neg Screening Labs: Oncology   reports that she has never smoked. She has never used smokeless tobacco. She reports current alcohol use of about 4.0 standard drinks of alcohol per week. She reports that she does not use drugs.  Past Medical History:  Diagnosis Date  . Abnormal Pap smear   . Breast cancer ([place])   . Elevated hemoglobin A1c June 2017   5.9  . GERD (gastroesophageal reflux disease)   . Migraine    uses imitrex as needed  . Osteopenia   . Osteoporosis   . Schatzki's ring    and hiatal hernia on EGD  . Shingles rash 07/2015    shingles on right rib cage   . TMJ (temporomandibular joint syndrome)   . Vertigo     Past Surgical History:  Procedure Laterality Date  . BREAST BIOPSY  1/12   fibrocystic changes   . BREAST LUMPECTOMY WITH RADIOACTIVE SEED AND SENTINEL LYMPH NODE BIOPSY Left 04/22/2016   Procedure: BREAST LUMPECTOMY WITH RADIOACTIVE SEED AND SENTINEL LYMPH NODE BIOPSY;  Surgeon: Excell Seltzer, MD;  Location: [place];  Service: General;  Laterality: Left;  . DILATION AND CURETTAGE OF UTERUS  1991  .  ESOPHAGEAL DILATION    . GYNECOLOGIC CRYOSURGERY  1980s  . LAPAROSCOPIC CHOLECYSTECTOMY  1992  . WISDOM TOOTH EXTRACTION      Current Outpatient Medications  Medication Sig Dispense Refill  . Ascorbic Acid (VITAMIN C) 100 MG tablet Take 100 mg by mouth daily.    Marland Kitchen CALCIUM PO Take by mouth daily.    . Cholecalciferol (VITAMIN D PO) Take 2,000 Units by mouth every other day.     . COLLAGEN PO Take by mouth.    Marland Kitchen ibuprofen (ADVIL,MOTRIN) 800 MG tablet Take 400 mg by mouth as needed for pain.     Marland Kitchen letrozole (FEMARA) 2.5 MG tablet TAKE 1 TABLET(2.5 MG) BY MOUTH DAILY 30 tablet 5  . Magnesium 100 MG CAPS Take 2 capsules by mouth daily.    . Multiple Vitamin (MULTIVITAMIN PO) Take by mouth.    . SUMAtriptan (IMITREX) 100 MG tablet TAKE 1 TABLET BY MOUTH ONE TIME FOR 1 DOSE,(MAY REPEAT IN 2 HOURS IF HEADACHE PERSISTS OR RECURS) 9 tablet 4   No current facility-administered medications for this visit.    Family History  Problem Relation Age of Onset  . Diabetes Father   . Goiter Mother   . Heart disease Other   . Cancer Brother  pancreas  . Cancer Maternal Grandfather        pancreas    Review of Systems  All other systems reviewed and are negative.   Exam:   BP 112/68 (BP Location: Right Arm, Patient Position: Sitting, Cuff Size: Normal)   Pulse 76   Temp (!) 97.2 F (36.2 C) (Temporal)   Resp 10   Ht 5' 5.75" (1.67 m)   Wt 151 lb (68.5 kg)   LMP 05/12/2012   BMI 24.56 kg/m      Height: 5' 5.75" (167 cm)  Ht Readings from Last 3 Encounters:  12/22/19 5' 5.75" (1.67 m)  12/21/19 _0  (1.651 m)  06/22/19 _1  (1.651 m)    General appearance: alert, cooperative and appears stated age Head: Normocephalic, without obvious abnormality, atraumatic Neck: no adenopathy, supple, symmetrical, trachea midline and thyroid normal to inspection and palpation Lungs: clear to auscultation bilaterally Breasts: normal appearance, no masses or tenderness,  Heart: regular rate  and rhythm Abdomen: soft, non-tender; bowel sounds normal; no masses,  no organomegaly Extremities: extremities normal, atraumatic, no cyanosis or edema Skin: Skin color, texture, turgor normal. No rashes or lesions Lymph nodes: Cervical, supraclavicular, and axillary nodes normal. No abnormal inguinal nodes palpated Neurologic: Grossly normal   Pelvic: External genitalia:  no lesions              Urethra:  normal appearing urethra with no masses, tenderness or lesions              Bartholins and Skenes: normal                 Vagina: normal appearing vagina with normal color and discharge, no lesions              Cervix: no lesions              Pap taken: No. Bimanual Exam:  Uterus:  normal size, contour, position, consistency, mobility, non-tender              Adnexa: normal adnexa and no mass, fullness, tenderness               Rectovaginal: Confirms               Anus:  normal sphincter tone, no lesions  Chaperone, Terence Lux, CMA, was present for exam.  A:  Well Woman with normal exam PMP, no HRT H/o breast cancer, ER/PR positive, her-2 neu negative, Stage IA, on Letrozole H/o migraines  Family hx of pancreatic cancer, scheduled for genetic testing  P:   Mammogram guidelines reviewed.  She is trying to have MRI done this year pap smear with Neg HR HPV 11/19 Lab work UTD Vaccines reviewed Colonoscopy referral placed today Return annually or prn

## 2019-12-22 NOTE — Telephone Encounter (Signed)
Scheduled appt per 3/11 los.  Sent a message to HIM pool to get a calendar mailed out.

## 2019-12-26 ENCOUNTER — Telehealth: Payer: Self-pay | Admitting: Obstetrics & Gynecology

## 2019-12-26 NOTE — Telephone Encounter (Signed)
Made in error

## 2020-01-02 ENCOUNTER — Encounter: Payer: Self-pay | Admitting: Certified Nurse Midwife

## 2020-01-09 ENCOUNTER — Ambulatory Visit: Payer: 59 | Admitting: Obstetrics & Gynecology

## 2020-01-23 ENCOUNTER — Inpatient Hospital Stay: Admission: RE | Admit: 2020-01-23 | Payer: 59 | Source: Ambulatory Visit

## 2020-02-06 ENCOUNTER — Telehealth: Payer: Self-pay | Admitting: *Deleted

## 2020-02-06 NOTE — Telephone Encounter (Signed)
Called pt to discuss scheduling for breast MRI. Left message to call office. Awaiting callback

## 2020-02-06 NOTE — Telephone Encounter (Signed)
-----   Message from Alla Feeling, NP sent at 02/06/2020 10:55 AM EDT ----- Her screening breast MRI is authorized by her insurance, if she is still interested in having this done please schedule for her in the next few weeks, not urgent.   Thanks, Regan Rakers

## 2020-02-07 ENCOUNTER — Telehealth: Payer: Self-pay | Admitting: *Deleted

## 2020-02-07 NOTE — Telephone Encounter (Signed)
2nd attempt calling patient. Message was left, again, to return call to office

## 2020-02-14 ENCOUNTER — Other Ambulatory Visit: Payer: Self-pay | Admitting: Gastroenterology

## 2020-02-14 DIAGNOSIS — R1310 Dysphagia, unspecified: Secondary | ICD-10-CM

## 2020-02-14 DIAGNOSIS — R1319 Other dysphagia: Secondary | ICD-10-CM

## 2020-02-20 ENCOUNTER — Ambulatory Visit
Admission: RE | Admit: 2020-02-20 | Discharge: 2020-02-20 | Disposition: A | Discharge status: Home / Self Care | Payer: 59 | Source: Ambulatory Visit | Attending: Gastroenterology | Admitting: Gastroenterology

## 2020-02-20 DIAGNOSIS — R1319 Other dysphagia: Secondary | ICD-10-CM

## 2020-02-20 DIAGNOSIS — R1310 Dysphagia, unspecified: Secondary | ICD-10-CM

## 2020-02-27 ENCOUNTER — Telehealth: Payer: Self-pay

## 2020-02-27 NOTE — Telephone Encounter (Signed)
Tiffany Nash left vm stating that the benadryl has not helped her sleep.  She is requesting Lunesta.  Request given to Cira Rue , NP

## 2020-03-01 ENCOUNTER — Telehealth: Payer: Self-pay | Admitting: Hematology

## 2020-03-01 ENCOUNTER — Telehealth: Payer: Self-pay

## 2020-03-01 ENCOUNTER — Other Ambulatory Visit: Payer: Self-pay | Admitting: Hematology

## 2020-03-01 MED ORDER — ESZOPICLONE 1 MG PO TABS: 1.0000 mg | ORAL_TABLET | Freq: Every evening | ORAL | 0 refills | Status: DC | PRN

## 2020-03-01 NOTE — Telephone Encounter (Signed)
I called pt and discussed her insomnia issue. She has chronic intermittent insomnia before cancer diagnosis, slightly worse after cancer diagnosis and antiestrogen therapy, she has tried benadryl and melatonin. She has never tried any prescription sleep medicine, and has talked to her friends about Ambien and Lunesta.  She would like to try Lunesta.  I discussed drug dependence, and encouraged her use only as needed.  I will call in Lunesta 1 mg at bedtime as needed for insomnia, and she can try half tablets first.  Per her request, I will call into [facility] in [place], where she is now.  Truitt Merle  03/01/2020

## 2020-03-01 NOTE — Telephone Encounter (Signed)
Pt called with 2nd request for Lunesta.  Request given to Dr. Burr Medico

## 2020-03-22 ENCOUNTER — Other Ambulatory Visit: Payer: Self-pay | Admitting: Obstetrics & Gynecology

## 2020-03-22 NOTE — Telephone Encounter (Signed)
Medication refill request: Sumatriptan Last AEX:  12/22/19 SM Next AEX: 12/30/20 Last MMG (if hormonal medication request): 07/20/19 BIRADS 2 benign/density c Refill authorized: Please advise

## 2020-04-22 ENCOUNTER — Encounter: Payer: 59 | Admitting: Genetic Counselor

## 2020-05-14 ENCOUNTER — Other Ambulatory Visit: Payer: Self-pay

## 2020-05-14 ENCOUNTER — Inpatient Hospital Stay: Payer: Self-pay

## 2020-05-14 ENCOUNTER — Other Ambulatory Visit: Payer: Self-pay | Admitting: Genetic Counselor

## 2020-05-14 ENCOUNTER — Inpatient Hospital Stay: Payer: Self-pay | Attending: Genetic Counselor | Admitting: Genetic Counselor

## 2020-05-14 ENCOUNTER — Encounter: Payer: Self-pay | Admitting: Genetic Counselor

## 2020-05-14 DIAGNOSIS — C50412 Malignant neoplasm of upper-outer quadrant of left female breast: Secondary | ICD-10-CM

## 2020-05-14 DIAGNOSIS — Z7289 Other problems related to lifestyle: Secondary | ICD-10-CM | POA: Insufficient documentation

## 2020-05-14 DIAGNOSIS — Z79899 Other long term (current) drug therapy: Secondary | ICD-10-CM | POA: Insufficient documentation

## 2020-05-14 DIAGNOSIS — Z8349 Family history of other endocrine, nutritional and metabolic diseases: Secondary | ICD-10-CM | POA: Insufficient documentation

## 2020-05-14 DIAGNOSIS — Z820 Family history of epilepsy and other diseases of the nervous system: Secondary | ICD-10-CM | POA: Insufficient documentation

## 2020-05-14 DIAGNOSIS — Z800 Family history of malignant neoplasm of digestive organs: Secondary | ICD-10-CM | POA: Insufficient documentation

## 2020-05-14 DIAGNOSIS — Z803 Family history of malignant neoplasm of breast: Secondary | ICD-10-CM | POA: Insufficient documentation

## 2020-05-14 DIAGNOSIS — M810 Age-related osteoporosis without current pathological fracture: Secondary | ICD-10-CM | POA: Insufficient documentation

## 2020-05-14 DIAGNOSIS — Z170 Estrogen receptor positive status [ER+]: Secondary | ICD-10-CM | POA: Insufficient documentation

## 2020-05-14 DIAGNOSIS — M8580 Other specified disorders of bone density and structure, unspecified site: Secondary | ICD-10-CM | POA: Insufficient documentation

## 2020-05-14 DIAGNOSIS — Z833 Family history of diabetes mellitus: Secondary | ICD-10-CM | POA: Insufficient documentation

## 2020-05-14 DIAGNOSIS — Z8249 Family history of ischemic heart disease and other diseases of the circulatory system: Secondary | ICD-10-CM | POA: Insufficient documentation

## 2020-05-14 LAB — CBC WITH DIFFERENTIAL/PLATELET
Abs Immature Granulocytes: 0.02 10*3/uL (ref 0.00–0.07)
Basophils Absolute: 0.1 10*3/uL (ref 0.0–0.1)
Basophils Relative: 1 %
Eosinophils Absolute: 0.5 10*3/uL (ref 0.0–0.5)
Eosinophils Relative: 9 %
HCT: 40.7 % (ref 36.0–46.0)
Hemoglobin: 13.5 g/dL (ref 12.0–15.0)
Immature Granulocytes: 0 %
Lymphocytes Relative: 28 %
Lymphs Abs: 1.8 10*3/uL (ref 0.7–4.0)
MCH: 32.7 pg (ref 26.0–34.0)
MCHC: 33.2 g/dL (ref 30.0–36.0)
MCV: 98.5 fL (ref 80.0–100.0)
Monocytes Absolute: 0.5 10*3/uL (ref 0.1–1.0)
Monocytes Relative: 8 %
Neutro Abs: 3.4 10*3/uL (ref 1.7–7.7)
Neutrophils Relative %: 54 %
Platelets: 308 10*3/uL (ref 150–400)
RBC: 4.13 MIL/uL (ref 3.87–5.11)
RDW: 13 % (ref 11.5–15.5)
WBC: 6.2 10*3/uL (ref 4.0–10.5)
nRBC: 0 % (ref 0.0–0.2)

## 2020-05-14 LAB — COMPREHENSIVE METABOLIC PANEL
ALT: 21 U/L (ref 0–44)
AST: 21 U/L (ref 15–41)
Albumin: 4.6 g/dL (ref 3.5–5.0)
Alkaline Phosphatase: 65 U/L (ref 38–126)
Anion gap: 8 (ref 5–15)
BUN: 16 mg/dL (ref 6–20)
CO2: 28 mmol/L (ref 22–32)
Calcium: 10.6 mg/dL — ABNORMAL HIGH (ref 8.9–10.3)
Chloride: 103 mmol/L (ref 98–111)
Creatinine, Ser: 0.87 mg/dL (ref 0.44–1.00)
GFR calc Af Amer: >60 mL/min (ref >60)
GFR calc non Af Amer: >60 mL/min (ref >60)
Glucose, Bld: 107 mg/dL — ABNORMAL HIGH (ref 70–99)
Potassium: 4.1 mmol/L (ref 3.5–5.1)
Sodium: 139 mmol/L (ref 135–145)
Total Bilirubin: 0.4 mg/dL (ref 0.3–1.2)
Total Protein: 8.1 g/dL (ref 6.5–8.1)

## 2020-05-14 NOTE — Progress Notes (Signed)
REFERRING PROVIDER: Alla Feeling, NP [place],  [place] [address]  PRIMARY PROVIDER:  Lujean Amel, MD  PRIMARY REASON FOR VISIT:  1. Malignant neoplasm of upper-outer quadrant of left breast in female, estrogen receptor positive ([place])   2. Family history of pancreatic cancer      HISTORY OF PRESENT ILLNESS:   Ms. Loughmiller, a 59 y.o. female, was seen for a [facility] cancer genetics consultation at the request of Hendricks Limes, due to a personal and family history of cancer.  Ms. Gongaware presents to clinic today to discuss the possibility of a hereditary predisposition to cancer, genetic testing, and to further clarify her future cancer risks, as well as potential cancer risks for family members.   In June 2017, at the age of 44, Ms. Melland was diagnosed with invasive ductal carcinoma and ductal carcinoma in situ, ER+/PR+/Her2-, of the left breast. The treatment plan included surgery (lumpectomy completed 04/22/2016), radiation therapy, oncotype, and anti-estrogen therapy with letrozole (current).    CANCER HISTORY:  Oncology History Overview Note  Breast cancer of upper-outer quadrant of left female breast [facility])   Staging form: Breast, AJCC 7th Edition   - Clinical stage from 04/15/2016: Stage IA (T1b, N0, M0) - Unsigned         Staging comments: Staged at breast conference on 7.5.17   - Pathologic stage from 04/22/2016: Stage IA (T1c, N0, cM0) - Unsigned      Breast cancer of upper-outer quadrant of left female breast ([place])  04/02/2016 Mammogram   1 cm oval massin the upper outer quadrant of left breast, suspicious for malignancy.    04/08/2016 Initial Diagnosis   Breast cancer of upper-outer quadrant of left female breast ([place])   04/08/2016 Initial Biopsy   Left breast core needle biopsy showed invasive ductal carcinoma and DCIS, grade 1-2.   04/08/2016 Receptors her2   Your 100% positive, strong staining, PR 70% positive, strong staining, HER-2 negative,  Ki-67 15%   04/22/2016 Surgery   Left breast lumpectomy and SLN biopsy ([place])   04/22/2016 Pathology Results   Left breast lumpectomy showed invasive ductal carcinoma, grade 1, 1.2 cm, low-grade DCIS, surgical margins were negative, 3 sentinel lymph nodes negative.   04/22/2016 Oncotype testing   RS 17, low risk, predicts 10 year distant recurrence risk of 11% with tamoxifen   05/26/2016 - 07/13/2016 Radiation Therapy   Adjuvant breast radiation [facility]): Left Breast/ 50.4 Gy in 28 fx.  Boost/ 10 Gy in 5 fx   07/2016 -  Anti-estrogen oral therapy   Letrozole 2.5 mg daily. Planned duration of therapy: 5-10 years.  Switched to Tamoxifen from 09/2018-12/2018 due to osteporosis, but due to poor tolerance we switched her to Letrozole again in 12/2018.    08/27/2016 Imaging   DEXA scan: Osteopenia. (T-score -2.4).    11/22/2017 Pathology Results   Diagnosis 11/22/17 Breast, left, needle core biopsy - DENSE STROMAL FIBROSIS, CONSISTENT WITH PRIOR SURGICAL PROCEDURE. - THERE IS NO EVIDENCE OF MALIGNANCY. - SEE COMMENT.     RISK FACTORS:  Menarche was at age 68.  First live birth at age 31.  OCP use for approximately 6 years.  Ovaries intact: yes.  Hysterectomy: no.  Menopausal status: postmenopausal.  HRT use: 0 years - progesterone taken during three pregnancies. Colonoscopy: yes; 2011. Mammogram within the last year: yes. Number of breast biopsies: 2. Any excessive radiation exposure in the past: radiation treatment for left breast cancer (2017). Per patient, many x-rays during first breast  biopsy (2012).   Past Medical History:  Diagnosis Date  . Abnormal Pap smear   . Breast cancer ([place])   . Elevated hemoglobin A1c June 2017   5.9  . Family history of pancreatic cancer   . GERD (gastroesophageal reflux disease)   . Migraine    uses imitrex as needed  . Osteopenia   . Osteoporosis   . Schatzki's ring    and hiatal hernia on EGD  . Shingles rash 07/2015    shingles on  right rib cage   . TMJ (temporomandibular joint syndrome)   . Vertigo     Past Surgical History:  Procedure Laterality Date  . BREAST BIOPSY  1/12   fibrocystic changes   . BREAST LUMPECTOMY WITH RADIOACTIVE SEED AND SENTINEL LYMPH NODE BIOPSY Left 04/22/2016   Procedure: BREAST LUMPECTOMY WITH RADIOACTIVE SEED AND SENTINEL LYMPH NODE BIOPSY;  Surgeon: Excell Seltzer, MD;  Location: [facility];  Service: General;  Laterality: Left;  . DILATION AND CURETTAGE OF UTERUS  1991  . ESOPHAGEAL DILATION    . GYNECOLOGIC CRYOSURGERY  1980s  . LAPAROSCOPIC CHOLECYSTECTOMY  1992  . WISDOM TOOTH EXTRACTION      Social History   Socioeconomic History  . Marital status: Married    Spouse name: Not on file  . Number of children: Not on file  . Years of education: Not on file  . Highest education level: Not on file  Occupational History  . Not on file  Tobacco Use  . Smoking status: Never Smoker  . Smokeless tobacco: Never Used  Vaping Use  . Vaping Use: Never used  Substance and Sexual Activity  . Alcohol use: Yes    Alcohol/week: 4.0 standard drinks    Types: 4 Standard drinks or equivalent per week    Comment: weekends only   . Drug use: No  . Sexual activity: Yes    Partners: Male    Comment: husband vasectomy  Other Topics Concern  . Not on file  Social History Narrative  . Not on file   Social Determinants of Health   Financial Resource Strain:   . Difficulty of Paying Living Expenses:   Food Insecurity:   . Worried About Charity fundraiser in the Last Year:   . Arboriculturist in the Last Year:   Transportation Needs:   . Film/video editor (Medical):   Marland Kitchen Lack of Transportation (Non-Medical):   Physical Activity:   . Days of Exercise per Week:   . Minutes of Exercise per Session:   Stress:   . Feeling of Stress :   Social Connections:   . Frequency of Communication with Friends and Family:   . Frequency of Social Gatherings with Friends and  Family:   . Attends Religious Services:   . Active Member of Clubs or Organizations:   . Attends Archivist Meetings:   Marland Kitchen Marital Status:      FAMILY HISTORY:  We obtained a detailed, 4-generation family history.  Significant diagnoses are listed below: Family History  Problem Relation Age of Onset  . Diabetes Father   . Goiter Mother   . Heart disease Other   . Pancreatic cancer Brother 39       no genetic testing  . Pancreatic cancer Maternal Grandfather        dx. early 74s  . ALS Maternal Aunt   . Heart Problems Maternal Grandmother   . Breast cancer Other  dx. 72s; maternal second cousin  . ALS Cousin    Ms. Amerman has two sons and two daughters (ages 40 to 70) and three grandchildren. She has two sisters and two brothers. One brother died at the age of 68 (not related to cancer). Her other brother is 91 and was diagnosed with stage IV pancreatic cancer in November 2020. Her oldest sister has had a pancreatic mass that was benign.  Ms. Dykman mother is 56 and has not had cancer. Ms. Benedick had three maternal uncles and one maternal aunt. Her aunt had a mastectomy in her 78s, although this was reportedly not due to cancer. Her aunt also had ALS, and had a son who died from [place]. Ms. Rathje maternal grandmother died at the age of 35 with heart problems, and her maternal grandfather died in his early 92s from pancreatic cancer. She has a maternal second cousin who was recently diagnosed with breast cancer in her 70s.   Ms. Urbanik father is 65 and has not had cancer. She has no paternal aunts or uncles. Her paternal grandmother died at the age of 44 and her paternal grandfather died at the age of 73 - neither had cancer.  Ms. Delgadillo is unaware of previous family history of genetic testing for hereditary cancer risks. Patient's maternal ancestors are of [place] descent, and paternal ancestors are of [place] descent. There is no reported Ashkenazi Jewish  ancestry. There is no known consanguinity.  GENETIC COUNSELING ASSESSMENT: Ms. Ludolph is a 59 y.o. female with a personal history of breast cancer and a family history of pancreatic cancer, which is somewhat suggestive of a hereditary cancer syndrome and predisposition to cancer. We, therefore, discussed and recommended the following at today's visit.   DISCUSSION: We discussed that approximately 5-10% of breast cancer is hereditary, with most cases associated with the BRCA1 and BRCA2 genes. There are other genes that can be associated with hereditary breast cancer syndromes. These include ATM, CHEK2, PALB2, etc. We discussed that testing is beneficial for several reasons, including knowing about other cancer risks, identifying potential screening and risk-reduction options that may be appropriate, and to understand if other family members could be at risk for cancer and allow them to undergo genetic testing.  We reviewed the characteristics, features and inheritance patterns of hereditary cancer syndromes. We also discussed genetic testing, including the appropriate family members to test, the process of testing, insurance coverage and turn-around-time for results. We discussed the implications of a negative, positive and/or variant of uncertain significant result. We recommended Ms. Colina pursue genetic testing for the [facility] Common Hereditary Cancers panel.   The Common Hereditary Cancers Panel offered by [facility] includes sequencing and/or deletion duplication testing of the following 48 genes: APC, ATM, AXIN2, BARD1, BMPR1A, BRCA1, BRCA2, BRIP1, CDH1, CDK4, CDKN2A (p14ARF), CDKN2A (p16INK4a), CHEK2, CTNNA1, DICER1, EPCAM (Deletion/duplication testing only), GREM1 (promoter region deletion/duplication testing only), KIT, MEN1, MLH1, MSH2, MSH3, MSH6, MUTYH, NBN, NF1, NTHL1, PALB2, PDGFRA, PMS2, POLD1, POLE, PTEN, RAD50, RAD51C, RAD51D, RNF43, SDHB, SDHC, SDHD, SMAD4, SMARCA4. STK11, TP53, TSC1,  TSC2, and VHL.  The following genes were evaluated for sequence changes only: SDHA and HOXB13 c.251G>A variant only.   Based on Ms. Dalal's personal and family history of cancer, she meets medical criteria for genetic testing. Despite that she meets criteria, she may still have an out of pocket cost. We discussed that if her out of pocket cost for testing is over $100, the laboratory will reach out to let her know. If  the out of pocket cost of testing is less than $100 she will be billed by the genetic testing laboratory.   Ms. Gaetano has a family history of pancreatic cancer in one first degree relative (brother) and one second degree relative (maternal grandfather). Per the International Cancer of the Pancreas Screening (CAPS) Consortium, individuals with a family history of pancreatic cancer in at least one first degree relative and one second degree relative should consider annual pancreatic cancer screening. We therefore recommended that Ms. Recendiz consider pancreatic cancer screening, regardless of her genetic test results.  PLAN: After considering the risks, benefits, and limitations, Ms. Jobst provided informed consent to pursue genetic testing and the blood sample was sent to [facility] for analysis of the Common Hereditary Cancers Panel. Results should be available within approximately two-three weeks' time, at which point they will be disclosed by telephone to Ms. Turlington, as will any additional recommendations warranted by these results. Ms. Guzy will receive a summary of her genetic counseling visit and a copy of her results once available. This information will also be available in Epic.   Based on Ms. Chiong's family history, we recommended her brother, who was diagnosed with pancreatic cancer at age 22, have genetic counseling and testing. Ms. Soulliere will let us know if we can be of any assistance in coordinating genetic counseling and/or testing for this family  member.   Ms. Larmore questions were answered to her satisfaction today. Our contact information was provided should additional questions or concerns arise. Thank you for the referral and allowing [place] to share in the care of your patient.   Clint Guy, [place], [facility] Licensed, Certified Dispensing [email or web address] Phone: [phone]  The patient was seen for a total of 50 minutes in face-to-face genetic counseling.  This patient was discussed with Drs. Magrinat, Lindi Adie and/or Burr Medico who agrees with the above.    _______________________________________________________________________ For Office Staff:  Number of people involved in session: 1 Was an Intern/ student involved with case: no

## 2020-05-15 LAB — GENETIC SCREENING ORDER

## 2020-05-28 ENCOUNTER — Encounter: Payer: Self-pay | Admitting: Genetic Counselor

## 2020-05-28 ENCOUNTER — Ambulatory Visit: Payer: Self-pay | Admitting: Genetic Counselor

## 2020-05-28 ENCOUNTER — Telehealth: Payer: Self-pay | Admitting: Genetic Counselor

## 2020-05-28 DIAGNOSIS — Z1379 Encounter for other screening for genetic and chromosomal anomalies: Secondary | ICD-10-CM | POA: Insufficient documentation

## 2020-05-28 DIAGNOSIS — Z1211 Encounter for screening for malignant neoplasm of colon: Secondary | ICD-10-CM | POA: Insufficient documentation

## 2020-05-28 NOTE — Progress Notes (Signed)
HPI:  [name] was previously seen in the [facility] clinic due to a personal and family history of cancer and concerns regarding a hereditary predisposition to cancer. Please refer to our prior cancer genetics clinic note for more information regarding our discussion, assessment and recommendations, at the time. [name] recent genetic test results were disclosed to her, as were recommendations warranted by these results. These results and recommendations are discussed in more detail below.  CANCER HISTORY:  Oncology History Overview Note  Breast cancer of upper-outer quadrant of left female breast [facility])   Staging form: Breast, AJCC 7th Edition   - Clinical stage from 04/15/2016: Stage IA (T1b, N0, M0) - Unsigned         Staging comments: Staged at breast conference on 7.5.17   - Pathologic stage from 04/22/2016: Stage IA (T1c, N0, cM0) - Unsigned      Breast cancer of upper-outer quadrant of left female breast ([place])  04/02/2016 Mammogram   1 cm oval massin the upper outer quadrant of left breast, suspicious for malignancy.    04/08/2016 Initial Diagnosis   Breast cancer of upper-outer quadrant of left female breast ([place])   04/08/2016 Initial Biopsy   Left breast core needle biopsy showed invasive ductal carcinoma and DCIS, grade 1-2.   04/08/2016 Receptors her2   Your 100% positive, strong staining, PR 70% positive, strong staining, HER-2 negative, Ki-67 15%   04/22/2016 Surgery   Left breast lumpectomy and SLN biopsy ([facility])   04/22/2016 Pathology Results   Left breast lumpectomy showed invasive ductal carcinoma, grade 1, 1.2 cm, low-grade DCIS, surgical margins were negative, 3 sentinel lymph nodes negative.   04/22/2016 Oncotype testing   RS 17, low risk, predicts 10 year distant recurrence risk of 11% with tamoxifen   05/26/2016 - 07/13/2016 Radiation Therapy   Adjuvant breast radiation [facility]): Left Breast/ 50.4 Gy in 28 fx.  Boost/ 10 Gy in 5 fx    07/2016 -  Anti-estrogen oral therapy   Letrozole 2.5 mg daily. Planned duration of therapy: 5-10 years.  Switched to Tamoxifen from 09/2018-12/2018 due to osteporosis, but due to poor tolerance we switched her to Letrozole again in 12/2018.    08/27/2016 Imaging   DEXA scan: Osteopenia. (T-score -2.4).    11/22/2017 Pathology Results   Diagnosis 11/22/17 Breast, left, needle core biopsy - DENSE STROMAL FIBROSIS, CONSISTENT WITH PRIOR SURGICAL PROCEDURE. - THERE IS NO EVIDENCE OF MALIGNANCY. - SEE COMMENT.   05/24/2020 Genetic Testing   Negative genetic testing:  No pathogenic variants detected on the [facility] Common Hereditary Cancers Panel. The report date is 05/24/2020.   The Common Hereditary Cancers Panel offered by [facility] includes sequencing and/or deletion duplication testing of the following 48 genes: APC, ATM, AXIN2, BARD1, BMPR1A, BRCA1, BRCA2, BRIP1, CDH1, CDK4, CDKN2A (p14ARF), CDKN2A (p16INK4a), CHEK2, CTNNA1, DICER1, EPCAM (Deletion/duplication testing only), GREM1 (promoter region deletion/duplication testing only), KIT, MEN1, MLH1, MSH2, MSH3, MSH6, MUTYH, NBN, NF1, NTHL1, PALB2, PDGFRA, PMS2, POLD1, POLE, PTEN, RAD50, RAD51C, RAD51D, RNF43, SDHB, SDHC, SDHD, SMAD4, SMARCA4. STK11, TP53, TSC1, TSC2, and VHL.  The following genes were evaluated for sequence changes only: SDHA and HOXB13 c.251G>A variant only.     FAMILY HISTORY:  We obtained a detailed, 4-generation family history.  Significant diagnoses are listed below: Family History  Problem Relation Age of Onset  . Diabetes Father   . Goiter Mother   . Heart disease Other   . Pancreatic cancer Brother 50  no genetic testing  . Pancreatic cancer Maternal Grandfather        dx. early 62s  . ALS Maternal Aunt   . Heart Problems Maternal Grandmother   . Breast cancer Other        dx. 58s; maternal second cousin  . ALS Cousin     [name] has two sons and two daughters (ages 74 to 57) and three  grandchildren. She has two sisters and two brothers. One brother died at the age of 59 (not related to cancer). Her other brother is 59 and was diagnosed with stage IV pancreatic cancer in November 2020. Her oldest sister has had a pancreatic mass that was benign.  [name] mother is 40 and has not had cancer. [name] had three maternal uncles and one maternal aunt. Her aunt had a mastectomy in her 75s, although this was reportedly not due to cancer. Her aunt also had ALS, and had a son who died from [place]. [name] maternal grandmother died at the age of 10 with heart problems, and her maternal grandfather died in his early 38s from pancreatic cancer. She has a maternal second cousin who was recently diagnosed with breast cancer in her 39s.   [name] father is 54 and has not had cancer. She has no paternal aunts or uncles. Her paternal grandmother died at the age of 73 and her paternal grandfather died at the age of 64 - neither had cancer.  [name] is unaware of previous family history of genetic testing for hereditary cancer risks. Patient's maternal ancestors are of [place] descent, and paternal ancestors are of English descent. There is no reported Ashkenazi Jewish ancestry. There is no known consanguinity.  GENETIC TEST RESULTS: Genetic testing reported out on 05/24/2020 through the [facility] Common Hereditary Cancers panel. No pathogenic variants were detected.   The Common Hereditary Cancers Panel offered by [facility] includes sequencing and/or deletion duplication testing of the following 48 genes: APC, ATM, AXIN2, BARD1, BMPR1A, BRCA1, BRCA2, BRIP1, CDH1, CDK4, CDKN2A (p14ARF), CDKN2A (p16INK4a), CHEK2, CTNNA1, DICER1, EPCAM (Deletion/duplication testing only), GREM1 (promoter region deletion/duplication testing only), KIT, MEN1, MLH1, MSH2, MSH3, MSH6, MUTYH, NBN, NF1, NTHL1, PALB2, PDGFRA, PMS2, POLD1, POLE, PTEN, RAD50, RAD51C, RAD51D, RNF43, SDHB, SDHC, SDHD, SMAD4,  SMARCA4. STK11, TP53, TSC1, TSC2, and VHL.  The following genes were evaluated for sequence changes only: SDHA and HOXB13 c.251G>A variant only. The test report will be scanned into EPIC and located under the Molecular Pathology section of the Results Review tab.  A portion of the result report is included below for reference.     We discussed with [name] that because current genetic testing is not perfect, it is possible there may be a gene mutation in one of these genes that current testing cannot detect, but that chance is small.  We also discussed that there could be another gene that has not yet been discovered, or that we have not yet tested, that is responsible for the cancer diagnoses in the family. It is also possible there is a hereditary cause for the cancer in the family that [name] did not inherit and therefore was not identified in her testing.  Therefore, it is important to remain in touch with cancer genetics in the future so that we can continue to offer [name] the most up to date genetic testing.   CANCER SCREENING RECOMMENDATIONS: [name] test result is considered negative (normal).  This means that we have not identified a  hereditary cause for her personal and family history of cancer at this time. While reassuring, this does not definitively rule out a hereditary predisposition to cancer. It is still possible that there could be genetic mutations that are undetectable by current technology. There could be genetic mutations in genes that have not been tested or identified to increase cancer risk.  Therefore, it is recommended she continue to follow the cancer management and screening guidelines provided by her oncology and primary healthcare provider.   An individual's cancer risk and medical management are not determined by genetic test results alone. Overall cancer risk assessment incorporates additional factors, including personal medical history, family  history, and any available genetic information that may result in a personalized plan for cancer prevention and surveillance.  [name] has a family history of pancreatic cancer in one first degree relative (brother) and one second degree relative (maternal grandfather). Per the International Cancer of the Pancreas Screening (CAPS) Consortium, individuals with a family history of pancreatic cancer in at least one first degree relative and one second degree relative should consider annual pancreatic cancer screening. We therefore recommended that [name] consider pancreatic cancer screening. [name] does have a GI specialist with whom she is currently established, Dr. Cristina Tiffany Nash. She will plan to see Dr. Cristina Tiffany Nash for follow up and discussion of appropriate screening options given her family history of pancreatic cancer.   RECOMMENDATIONS FOR FAMILY MEMBERS:  Individuals in this family might be at some increased risk of developing cancer, over the general population risk, simply due to the family history of cancer.  We recommended women in this family have a yearly mammogram beginning at age 10, or 74 years younger than the earliest onset of cancer, an annual clinical breast exam, and perform monthly breast self-exams. Women in this family should also have a gynecological exam as recommended by their primary provider. All family members should be referred for colonoscopy starting at age 57.  It is also possible there is a hereditary cause for the cancer in [name]'s family that she did not inherit and therefore was not identified in her.  Based on [name]'s family history, we recommended her brother, who was diagnosed with pancreatic cancer at age 52, have genetic counseling and testing. [name] will let us know if we can be of any assistance in coordinating genetic counseling and/or testing for this family member.   FOLLOW-UP: Lastly, we discussed with [name] that cancer  genetics is a rapidly advancing field and it is possible that new genetic tests will be appropriate for her and/or her family members in the future. We encouraged her to remain in contact with cancer genetics on an annual basis so we can update her personal and family histories and let her know of advances in cancer genetics that may benefit this family.   Our contact number was provided. [name] questions were answered to her satisfaction, and she knows she is welcome to call us at anytime with additional questions or concerns.   Clint Guy, MS, [facility] Genetic Counselor [email or web address] Phone: [phone]

## 2020-05-28 NOTE — Telephone Encounter (Signed)
Revealed negative genetic testing. Discussed that we do not know why she has had breast cancer or why there is cancer in the family. There could be a genetic mutation in the family that Tiffany Nash did not inherit. There could also be a mutation in a different gene that we are not testing, or our current technology may not be able detect certain mutations. It will therefore be important for her to stay in contact with genetics to keep up with whether additional testing may be appropriate in the future.

## 2020-06-19 ENCOUNTER — Other Ambulatory Visit: Payer: Self-pay | Admitting: Hematology

## 2020-06-21 ENCOUNTER — Other Ambulatory Visit: Payer: Self-pay | Admitting: Hematology

## 2020-06-21 DIAGNOSIS — C50412 Malignant neoplasm of upper-outer quadrant of left female breast: Secondary | ICD-10-CM

## 2020-06-21 DIAGNOSIS — M816 Localized osteoporosis [Lequesne]: Secondary | ICD-10-CM

## 2020-06-24 ENCOUNTER — Inpatient Hospital Stay: Payer: Self-pay | Admitting: Hematology

## 2020-06-24 ENCOUNTER — Inpatient Hospital Stay: Payer: Self-pay

## 2020-06-24 ENCOUNTER — Telehealth: Payer: Self-pay | Admitting: Hematology

## 2020-06-24 NOTE — Telephone Encounter (Signed)
R/s appt per 9/12 sch msg - pt is aware of new appt date and time

## 2020-07-02 ENCOUNTER — Inpatient Hospital Stay: Payer: Self-pay | Attending: Genetic Counselor

## 2020-07-02 ENCOUNTER — Other Ambulatory Visit: Payer: Self-pay

## 2020-07-02 ENCOUNTER — Telehealth: Payer: Self-pay | Admitting: Hematology

## 2020-07-02 ENCOUNTER — Encounter: Payer: Self-pay | Admitting: Nurse Practitioner

## 2020-07-02 ENCOUNTER — Inpatient Hospital Stay (HOSPITAL_BASED_OUTPATIENT_CLINIC_OR_DEPARTMENT_OTHER): Payer: Self-pay | Admitting: Nurse Practitioner

## 2020-07-02 VITALS — BP 116/73 | HR 63 | Temp 97.8°F | Resp 18 | Ht 65.75 in | Wt 151.4 lb

## 2020-07-02 DIAGNOSIS — C50412 Malignant neoplasm of upper-outer quadrant of left female breast: Secondary | ICD-10-CM

## 2020-07-02 DIAGNOSIS — M8580 Other specified disorders of bone density and structure, unspecified site: Secondary | ICD-10-CM | POA: Insufficient documentation

## 2020-07-02 DIAGNOSIS — Z880 Allergy status to penicillin: Secondary | ICD-10-CM | POA: Insufficient documentation

## 2020-07-02 DIAGNOSIS — Z170 Estrogen receptor positive status [ER+]: Secondary | ICD-10-CM

## 2020-07-02 DIAGNOSIS — G4700 Insomnia, unspecified: Secondary | ICD-10-CM | POA: Insufficient documentation

## 2020-07-02 DIAGNOSIS — Z9049 Acquired absence of other specified parts of digestive tract: Secondary | ICD-10-CM | POA: Insufficient documentation

## 2020-07-02 DIAGNOSIS — Z800 Family history of malignant neoplasm of digestive organs: Secondary | ICD-10-CM | POA: Insufficient documentation

## 2020-07-02 DIAGNOSIS — R21 Rash and other nonspecific skin eruption: Secondary | ICD-10-CM | POA: Insufficient documentation

## 2020-07-02 DIAGNOSIS — M816 Localized osteoporosis [Lequesne]: Secondary | ICD-10-CM

## 2020-07-02 DIAGNOSIS — Z79899 Other long term (current) drug therapy: Secondary | ICD-10-CM | POA: Insufficient documentation

## 2020-07-02 LAB — CBC WITH DIFFERENTIAL (CANCER CENTER ONLY)
Abs Immature Granulocytes: 0.02 10*3/uL (ref 0.00–0.07)
Basophils Absolute: 0 10*3/uL (ref 0.0–0.1)
Basophils Relative: 1 %
Eosinophils Absolute: 0.6 10*3/uL — ABNORMAL HIGH (ref 0.0–0.5)
Eosinophils Relative: 9 %
HCT: 37.8 % (ref 36.0–46.0)
Hemoglobin: 12.2 g/dL (ref 12.0–15.0)
Immature Granulocytes: 0 %
Lymphocytes Relative: 26 %
Lymphs Abs: 1.7 10*3/uL (ref 0.7–4.0)
MCH: 31.7 pg (ref 26.0–34.0)
MCHC: 32.3 g/dL (ref 30.0–36.0)
MCV: 98.2 fL (ref 80.0–100.0)
Monocytes Absolute: 0.5 10*3/uL (ref 0.1–1.0)
Monocytes Relative: 8 %
Neutro Abs: 3.6 10*3/uL (ref 1.7–7.7)
Neutrophils Relative %: 56 %
Platelet Count: 278 10*3/uL (ref 150–400)
RBC: 3.85 MIL/uL — ABNORMAL LOW (ref 3.87–5.11)
RDW: 12.9 % (ref 11.5–15.5)
WBC Count: 6.3 10*3/uL (ref 4.0–10.5)
nRBC: 0 % (ref 0.0–0.2)

## 2020-07-02 LAB — CMP (CANCER CENTER ONLY)
ALT: 21 U/L (ref 0–44)
AST: 21 U/L (ref 15–41)
Albumin: 4.2 g/dL (ref 3.5–5.0)
Alkaline Phosphatase: 61 U/L (ref 38–126)
Anion gap: 5 (ref 5–15)
BUN: 17 mg/dL (ref 6–20)
CO2: 29 mmol/L (ref 22–32)
Calcium: 9.8 mg/dL (ref 8.9–10.3)
Chloride: 104 mmol/L (ref 98–111)
Creatinine: 0.83 mg/dL (ref 0.44–1.00)
GFR, Est AFR Am: >60 mL/min (ref >60)
GFR, Estimated: >60 mL/min (ref >60)
Glucose, Bld: 107 mg/dL — ABNORMAL HIGH (ref 70–99)
Potassium: 4.7 mmol/L (ref 3.5–5.1)
Sodium: 138 mmol/L (ref 135–145)
Total Bilirubin: 0.5 mg/dL (ref 0.3–1.2)
Total Protein: 7.4 g/dL (ref 6.5–8.1)

## 2020-07-02 LAB — VITAMIN D 25 HYDROXY (VIT D DEFICIENCY, FRACTURES): Vit D, 25-Hydroxy: 39.81 ng/mL (ref 30–100)

## 2020-07-02 NOTE — Progress Notes (Addendum)
[place]   Telephone:([phone] Fax:([phone]   Clinic Follow up Note   Patient Care Team: Lujean Amel, MD as PCP - General (Family Medicine) Excell Seltzer, MD (Inactive) as Consulting Physician (General Surgery) Truitt Merle, MD as Consulting Physician (Hematology) Kyung Rudd, MD as Consulting Physician (Radiation Oncology) 07/02/2020  CHIEF COMPLAINT: Follow-up left breast cancer  SUMMARY OF ONCOLOGIC HISTORY: Oncology History Overview Note  Breast cancer of upper-outer quadrant of left female breast [facility])   Staging form: Breast, AJCC 7th Edition   - Clinical stage from 04/15/2016: Stage IA (T1b, N0, M0) - Unsigned         Staging comments: Staged at breast conference on 7.5.17   - Pathologic stage from 04/22/2016: Stage IA (T1c, N0, cM0) - Unsigned      Breast cancer of upper-outer quadrant of left female breast ([place])  04/02/2016 Mammogram   1 cm oval massin the upper outer quadrant of left breast, suspicious for malignancy.    04/08/2016 Initial Diagnosis   Breast cancer of upper-outer quadrant of left female breast ([place])   04/08/2016 Initial Biopsy   Left breast core needle biopsy showed invasive ductal carcinoma and DCIS, grade 1-2.   04/08/2016 Receptors her2   Your 100% positive, strong staining, PR 70% positive, strong staining, HER-2 negative, Ki-67 15%   04/22/2016 Surgery   Left breast lumpectomy and SLN biopsy ([place])   04/22/2016 Pathology Results   Left breast lumpectomy showed invasive ductal carcinoma, grade 1, 1.2 cm, low-grade DCIS, surgical margins were negative, 3 sentinel lymph nodes negative.   04/22/2016 Oncotype testing   RS 17, low risk, predicts 10 year distant recurrence risk of 11% with tamoxifen   05/26/2016 - 07/13/2016 Radiation Therapy   Adjuvant breast radiation [facility]): Left Breast/ 50.4 Gy in 28 fx.  Boost/ 10 Gy in 5 fx   07/2016 -  Anti-estrogen oral therapy   Letrozole 2.5 mg daily. Planned duration of  therapy: 5-10 years.  Switched to Tamoxifen from 09/2018-12/2018 due to osteporosis, but due to poor tolerance we switched her to Letrozole again in 12/2018.    08/27/2016 Imaging   DEXA scan: Osteopenia. (T-score -2.4).    11/22/2017 Pathology Results   Diagnosis 11/22/17 Breast, left, needle core biopsy - DENSE STROMAL FIBROSIS, CONSISTENT WITH PRIOR SURGICAL PROCEDURE. - THERE IS NO EVIDENCE OF MALIGNANCY. - SEE COMMENT.   05/24/2020 Genetic Testing   Negative genetic testing:  No pathogenic variants detected on the [facility] Common Hereditary Cancers Panel. The report date is 05/24/2020.   The Common Hereditary Cancers Panel offered by [facility] includes sequencing and/or deletion duplication testing of the following 48 genes: APC, ATM, AXIN2, BARD1, BMPR1A, BRCA1, BRCA2, BRIP1, CDH1, CDK4, CDKN2A (p14ARF), CDKN2A (p16INK4a), CHEK2, CTNNA1, DICER1, EPCAM (Deletion/duplication testing only), GREM1 (promoter region deletion/duplication testing only), KIT, MEN1, MLH1, MSH2, MSH3, MSH6, MUTYH, NBN, NF1, NTHL1, PALB2, PDGFRA, PMS2, POLD1, POLE, PTEN, RAD50, RAD51C, RAD51D, RNF43, SDHB, SDHC, SDHD, SMAD4, SMARCA4. STK11, TP53, TSC1, TSC2, and VHL.  The following genes were evaluated for sequence changes only: SDHA and HOXB13 c.251G>A variant only.     CURRENT THERAPY:   Letrozole 2.5 mg daily started in 07/2016. Switched to Tamoxifen from 09/2018-12/2018 due to osteoporosis, but due to poor tolerance we switched her to Letrozole again in 12/2018.  INTERVAL HISTORY: Tiffany Nash returns for follow-up as scheduled.  She was last seen for routine surveillance visit on 12/21/2019.  She is doing well overall.  She does self breast exams the first of  every month, notes soreness in the left axilla on palpation but otherwise does not notice it.  This is stable since surgery.  Denies new lump/mass, skin change or nipple discharge.  She continues letrozole, denies bone change or hot flash.  She has stable vaginal  dryness that she is able to manage.  She is considering the [facility] procedure.  Denies any GYN/rectal bleeding or changes in her bowel habits.  She will be due for colonoscopy in December and plans to get an endoscopy for history of heartburn per Dr. Cristina Gong.  She also plans to discuss screening for pancreatic cancer.  Her genetic result was negative.  No signs of thrombosis.  Energy and appetite are adequate.  She took Lunesta twice but did make her feel great the next day.  She is taking Benadryl for a skin rash on her neck, back, arms and legs which helped her sleep and topical triamcinolone which is helping.  She thinks this may be related to insect bites from the beach.  Denies new detergents, changes in her hygiene, or new exposures.  She has a "heaviness" in the chest since her skin rash erupted.  No cough, dyspnea, fever, chills, sore throat.  She has not decided on the Covid vaccine yet, still doing research.    MEDICAL HISTORY:  Past Medical History:  Diagnosis Date  . Abnormal Pap smear   . Breast cancer ([place])   . Elevated hemoglobin A1c June 2017   5.9  . Family history of pancreatic cancer   . GERD (gastroesophageal reflux disease)   . Migraine    uses imitrex as needed  . Osteopenia   . Osteoporosis   . Schatzki's ring    and hiatal hernia on EGD  . Shingles rash 07/2015    shingles on right rib cage   . TMJ (temporomandibular joint syndrome)   . Vertigo     SURGICAL HISTORY: Past Surgical History:  Procedure Laterality Date  . BREAST BIOPSY  1/12   fibrocystic changes   . BREAST LUMPECTOMY WITH RADIOACTIVE SEED AND SENTINEL LYMPH NODE BIOPSY Left 04/22/2016   Procedure: BREAST LUMPECTOMY WITH RADIOACTIVE SEED AND SENTINEL LYMPH NODE BIOPSY;  Surgeon: Excell Seltzer, MD;  Location: [facility];  Service: General;  Laterality: Left;  . DILATION AND CURETTAGE OF UTERUS  1991  . ESOPHAGEAL DILATION    . GYNECOLOGIC CRYOSURGERY  1980s  . LAPAROSCOPIC  CHOLECYSTECTOMY  1992  . WISDOM TOOTH EXTRACTION      I have reviewed the social history and family history with the patient and they are unchanged from previous note.  ALLERGIES:  is allergic to penicillins and tylox [oxycodone-acetaminophen].  MEDICATIONS:  Current Outpatient Medications  Medication Sig Dispense Refill  . CALCIUM PO Take by mouth daily.    . Cholecalciferol (VITAMIN D PO) Take 2,000 Units by mouth every other day.     . COLLAGEN PO Take by mouth.    . eszopiclone (LUNESTA) 1 MG TABS tablet Take 1 tablet (1 mg total) by mouth at bedtime as needed for sleep. Take immediately before bedtime 30 tablet 0  . ibuprofen (ADVIL,MOTRIN) 800 MG tablet Take 400 mg by mouth as needed for pain.     Marland Kitchen letrozole (FEMARA) 2.5 MG tablet TAKE 1 TABLET(2.5 MG) BY MOUTH DAILY 30 tablet 5  . Magnesium 100 MG CAPS Take 2 capsules by mouth daily.    . Multiple Vitamin (MULTIVITAMIN PO) Take by mouth.    . Ascorbic Acid (VITAMIN  C) 100 MG tablet Take 100 mg by mouth daily.    . SUMAtriptan (IMITREX) 100 MG tablet TAKE 1 TABLET BY MOUTH 1 TIME FOR 1 DOSE. MAY REPEAT IN 2 HOURS IF HEADACHE PERSISTS OR RECURS 9 tablet 4   No current facility-administered medications for this visit.    PHYSICAL EXAMINATION: ECOG PERFORMANCE STATUS: 0 - Asymptomatic  Vitals:   07/02/20 1011  BP: 116/73  Pulse: 63  Resp: 18  Temp: 97.8 F (36.6 C)  SpO2: 99%   Filed Weights   07/02/20 1011  Weight: 151 lb 6.4 oz (68.7 kg)    GENERAL:alert, no distress and comfortable SKIN: Scattered maculopapular eruptions to the posterior neck and few areas on the arms and legs EYES: sclera clear NECK: Without mass LYMPH:  no palpable cervical or supraclavicular lymphadenopathy  LUNGS: clear with normal breathing effort HEART: regular rate & rhythm, no lower extremity edema Musculoskeletal: No focal sternal or costal tenderness NEURO: alert & oriented x 3 with fluent speech Breast: S/p left lumpectomy.   Incisions completely healed with scar tissue.  No palpable mass or nipple discharge in either breast or axilla that I could appreciate  LABORATORY DATA:  I have reviewed the data as listed CBC Latest Ref Rng & Units 07/02/2020 05/14/2020 12/21/2019  WBC 4.0 - 10.5 K/uL 6.3 6.2 6.5  Hemoglobin 12.0 - 15.0 g/dL 12.2 13.5 12.4  Hematocrit 36 - 46 % 37.8 40.7 37.6  Platelets 150 - 400 K/uL 278 308 297     CMP Latest Ref Rng & Units 07/02/2020 05/14/2020 12/21/2019  Glucose 70 - 99 mg/dL 107(H) 107(H) 119(H)  BUN 6 - 20 mg/dL 17 16 16   Creatinine 0.44 - 1.00 mg/dL 0.83 0.87 0.82  Sodium 135 - 145 mmol/L 138 139 140  Potassium 3.5 - 5.1 mmol/L 4.7 4.1 4.4  Chloride 98 - 111 mmol/L 104 103 104  CO2 22 - 32 mmol/L 29 28 27   Calcium 8.9 - 10.3 mg/dL 9.8 10.6(H) 9.1  Total Protein 6.5 - 8.1 g/dL 7.4 8.1 7.0  Total Bilirubin 0.3 - 1.2 mg/dL 0.5 0.4 0.4  Alkaline Phos 38 - 126 U/L 61 65 68  AST 15 - 41 U/L 21 21 22   ALT 0 - 44 U/L 21 21 22       RADIOGRAPHIC STUDIES: I have personally reviewed the radiological images as listed and agreed with the findings in the report. No results found.   ASSESSMENT & PLAN: Tiyanna' E Vidovichis a 59 y.o.femalewith   1. Breast cancer of lower-outer quadrant of left female breast, invasive and in situ ductal carcinoma, grade 1, pT1cN0M0, stage IA, 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 S/p left breast lumpectomy and adjuvantradiation. Oncotype RS showed low risk, 17, which predicts 10 year distant recurrence after 5 years of tamoxifen 11%. Chemotherapy was not recommended  -She started anti-estrogen therapy with letrozole in 07/2016.Due to Osteoporosis she was switched to Tamoxifen in 12/2019but did nottoleratewell due to insomnia and vaginal discharge.Switched back to Letrozole in 12/2018.   Goal is total 5-7 years -She has breast density category C, her initial breast cancer in 03/2016 was diagnosed 3 months after a  negative mammogram, she is interested in additional breast screening MRI which was approved and scheduled in 01/2020 but patient did not show. -Mammogram 07/20/2019 showed posttreatment changes in the left breast, negative for malignancy -Continue surveillance  2. Osteoporosis -Her 08/2018 [facility] showsosteoporosiswith Lowest T-score of -2.8 at [facility]. -She  tried Zometa on 01/02/19 but had severe flu like reaction with fever lasting 2 weeks. This was discontinued  -continueweight bearingexercisesand dailycalcium and vitamin D -repeat DEXA in 10/17/19 showed improved T score at [facility] to -2.4  3. Family history  -she updated me today that one of her brothers and MGF had pancreatic cancer, and one of her sisters had a pancreatic mass that was benign.  -Pt has 4 children, and she is 1 of 5 siblings.  -She underwent genetic testing by [facility] Common hereditary cancers panel, report on 05/24/2020 was negative for pathogenic variants  4.  Insomnia -Pre-existing, worsened after cancer diagnosis and tamoxifen -Tried Benadryl without much success.  Also tried [place] periodically which did not help much -He is currently taking Benadryl for a rash which is helping her sleep.  She plans to try Lunesta again after she stops needing Benadryl  5.  Skin rash -Etiology unknown, onset 1 week ago after possible insect bites at the beach -Responding to triamcinolone and Benadryl -Unusual distribution for letrozole related rash, will monitor closely   Disposition: Ms. Arca is clinically doing well.  She continues letrozole, tolerating well with minimal side effects.  Breast exam is benign.  CBC and CMP are unremarkable.  She is on a multivitamin and calcium supplement for history of osteoporosis.  There is no clinical concern for breast cancer recurrence.  We will continue AI for total of 5-7 years and surveillance.  Her annual mammogram is due in 07/2020.   For her skin rash, she will  continue topical triamcinolone and follow-up with PCP.  This would be an unusual distribution for a drug rash from letrozole.  We reviewed healthy lifestyle practices, continue self breast exam, and remain up-to-date on other age-appropriate cancer screenings and provider follow-ups.  She will see Dr. Cristina Gong in December to discuss GI cancer screenings.  She will return for lab and next routine surveillance visit in 6 months, or sooner if needed.   Orders Placed This Encounter  Procedures  . MM DIAG BREAST TOMO BILATERAL    Standing Status:   Future    Standing Expiration Date:   07/02/2021    Scheduling Instructions:     [facility]    Order Specific Question:   Reason for Exam (SYMPTOM  OR DIAGNOSIS REQUIRED)    Answer:   History of left breast cancer s/p lumpectomy, radiation, and AI    Order Specific Question:   Is the patient pregnant?    Answer:   Yes    Order Specific Question:   Preferred imaging location?    Answer:   External   All questions were answered. The patient knows to call the clinic with any problems, questions or concerns. No barriers to learning were detected.     Alla Feeling, NP 07/02/20

## 2020-07-02 NOTE — Telephone Encounter (Signed)
Scheduled per los. Declined printout  

## 2020-07-20 DIAGNOSIS — J0100 Acute maxillary sinusitis, unspecified: Secondary | ICD-10-CM | POA: Diagnosis not present

## 2020-07-20 DIAGNOSIS — Z20822 Contact with and (suspected) exposure to covid-19: Secondary | ICD-10-CM | POA: Diagnosis not present

## 2020-07-20 DIAGNOSIS — R059 Cough, unspecified: Secondary | ICD-10-CM | POA: Diagnosis not present

## 2020-07-20 DIAGNOSIS — Z03818 Encounter for observation for suspected exposure to other biological agents ruled out: Secondary | ICD-10-CM | POA: Diagnosis not present

## 2020-08-22 DIAGNOSIS — F4321 Adjustment disorder with depressed mood: Secondary | ICD-10-CM | POA: Diagnosis not present

## 2020-08-22 DIAGNOSIS — J029 Acute pharyngitis, unspecified: Secondary | ICD-10-CM | POA: Diagnosis not present

## 2020-08-22 DIAGNOSIS — Z20822 Contact with and (suspected) exposure to covid-19: Secondary | ICD-10-CM | POA: Diagnosis not present

## 2020-09-11 DIAGNOSIS — F4320 Adjustment disorder, unspecified: Secondary | ICD-10-CM | POA: Diagnosis not present

## 2020-09-23 DIAGNOSIS — H00021 Hordeolum internum right upper eyelid: Secondary | ICD-10-CM | POA: Diagnosis not present

## 2020-09-25 DIAGNOSIS — F4320 Adjustment disorder, unspecified: Secondary | ICD-10-CM | POA: Diagnosis not present

## 2020-09-26 DIAGNOSIS — H2513 Age-related nuclear cataract, bilateral: Secondary | ICD-10-CM | POA: Diagnosis not present

## 2020-09-26 DIAGNOSIS — H5213 Myopia, bilateral: Secondary | ICD-10-CM | POA: Diagnosis not present

## 2020-10-02 DIAGNOSIS — Z853 Personal history of malignant neoplasm of breast: Secondary | ICD-10-CM | POA: Diagnosis not present

## 2020-10-09 DIAGNOSIS — F4320 Adjustment disorder, unspecified: Secondary | ICD-10-CM | POA: Diagnosis not present

## 2020-10-23 DIAGNOSIS — F4320 Adjustment disorder, unspecified: Secondary | ICD-10-CM | POA: Diagnosis not present

## 2020-10-24 ENCOUNTER — Other Ambulatory Visit: Payer: Self-pay | Admitting: Obstetrics & Gynecology

## 2020-11-06 DIAGNOSIS — F4320 Adjustment disorder, unspecified: Secondary | ICD-10-CM | POA: Diagnosis not present

## 2020-11-06 DIAGNOSIS — R0789 Other chest pain: Secondary | ICD-10-CM | POA: Diagnosis not present

## 2020-11-06 DIAGNOSIS — U071 COVID-19: Secondary | ICD-10-CM | POA: Diagnosis not present

## 2020-11-20 DIAGNOSIS — F4320 Adjustment disorder, unspecified: Secondary | ICD-10-CM | POA: Diagnosis not present

## 2020-11-30 ENCOUNTER — Other Ambulatory Visit: Payer: Self-pay | Admitting: Hematology

## 2020-12-04 DIAGNOSIS — F4320 Adjustment disorder, unspecified: Secondary | ICD-10-CM | POA: Diagnosis not present

## 2020-12-16 DIAGNOSIS — F4320 Adjustment disorder, unspecified: Secondary | ICD-10-CM | POA: Diagnosis not present

## 2020-12-17 ENCOUNTER — Other Ambulatory Visit: Payer: Self-pay | Admitting: Obstetrics & Gynecology

## 2020-12-18 DIAGNOSIS — F4320 Adjustment disorder, unspecified: Secondary | ICD-10-CM | POA: Diagnosis not present

## 2020-12-30 ENCOUNTER — Ambulatory Visit: Payer: Self-pay

## 2020-12-30 NOTE — Progress Notes (Signed)
[place]   Telephone:(336) [phone] Fax:(336) [phone]   Clinic Follow up Note   Patient Care Team: Lujean Amel, MD as PCP - General (Family Medicine) Excell Seltzer, MD (Inactive) as Consulting Physician (General Surgery) Truitt Merle, MD as Consulting Physician (Hematology) Kyung Rudd, MD as Consulting Physician (Radiation Oncology)  Date of Service:  01/02/2021  CHIEF COMPLAINT: Follow up left breast cancer  SUMMARY OF ONCOLOGIC HISTORY: Oncology History Overview Note  Breast cancer of upper-outer quadrant of left female breast [facility])   Staging form: Breast, AJCC 7th Edition   - Clinical stage from 04/15/2016: Stage IA (T1b, N0, M0) - Unsigned         Staging comments: Staged at breast conference on 7.5.17   - Pathologic stage from 04/22/2016: Stage IA (T1c, N0, cM0) - Unsigned      Breast cancer of upper-outer quadrant of left female breast ([place])  04/02/2016 Mammogram   1 cm oval massin the upper outer quadrant of left breast, suspicious for malignancy.    04/08/2016 Initial Diagnosis   Breast cancer of upper-outer quadrant of left female breast ([place])   04/08/2016 Initial Biopsy   Left breast core needle biopsy showed invasive ductal carcinoma and DCIS, grade 1-2.   04/08/2016 Receptors her2   Your 100% positive, strong staining, PR 70% positive, strong staining, HER-2 negative, Ki-67 15%   04/22/2016 Surgery   Left breast lumpectomy and SLN biopsy ([facility])   04/22/2016 Pathology Results   Left breast lumpectomy showed invasive ductal carcinoma, grade 1, 1.2 cm, low-grade DCIS, surgical margins were negative, 3 sentinel lymph nodes negative.   04/22/2016 Oncotype testing   RS 17, low risk, predicts 10 year distant recurrence risk of 11% with tamoxifen   05/26/2016 - 07/13/2016 Radiation Therapy   Adjuvant breast radiation [facility]): Left Breast/ 50.4 Gy in 28 fx.  Boost/ 10 Gy in 5 fx   07/2016 -  Anti-estrogen oral therapy   Letrozole 2.5 mg daily.  Planned duration of therapy: 5-10 years.  Switched to Tamoxifen from 09/2018-12/2018 due to osteporosis, but due to poor tolerance we switched her to Letrozole again in 12/2018.    08/27/2016 Imaging   DEXA scan: Osteopenia. (T-score -2.4).    11/22/2017 Pathology Results   Diagnosis 11/22/17 Breast, left, needle core biopsy - DENSE STROMAL FIBROSIS, CONSISTENT WITH PRIOR SURGICAL PROCEDURE. - THERE IS NO EVIDENCE OF MALIGNANCY. - SEE COMMENT.   05/24/2020 Genetic Testing   Negative genetic testing:  No pathogenic variants detected on the [facility] Common Hereditary Cancers Panel. The report date is 05/24/2020.   The Common Hereditary Cancers Panel offered by [facility] includes sequencing and/or deletion duplication testing of the following 48 genes: APC, ATM, AXIN2, BARD1, BMPR1A, BRCA1, BRCA2, BRIP1, CDH1, CDK4, CDKN2A (p14ARF), CDKN2A (p16INK4a), CHEK2, CTNNA1, DICER1, EPCAM (Deletion/duplication testing only), GREM1 (promoter region deletion/duplication testing only), KIT, MEN1, MLH1, MSH2, MSH3, MSH6, MUTYH, NBN, NF1, NTHL1, PALB2, PDGFRA, PMS2, POLD1, POLE, PTEN, RAD50, RAD51C, RAD51D, RNF43, SDHB, SDHC, SDHD, SMAD4, SMARCA4. STK11, TP53, TSC1, TSC2, and VHL.  The following genes were evaluated for sequence changes only: SDHA and HOXB13 c.251G>A variant only.      CURRENT THERAPY:  Letrozole 2.5 mg daily started in 07/2016. Switched to Tamoxifen from 09/2018-12/2018 due to osteoporosis, but due to poor tolerance we switched her to Letrozole again in 12/2018.  INTERVAL HISTORY:  Tiffany Nash is here for a follow up of left breast cancer. She was last seen by me in 06/2019 and seen by NP Regan Rakers  in 06/2020. She presents to the clinic with her husband.  Her only complaint is insomnia, she sometimes only sleeps 2 to 3 hours a night.  She has tried over-the-counter sleep aid, not very effective.  She is overall still able to function well during the day. Occasional hot flushes  No join pain or  other pain.  She has good appetite and energy level, weight has been stable. She is tolerating letrozole well, no significant side effects.   All other systems were reviewed with the patient and are negative.  MEDICAL HISTORY:  Past Medical History:  Diagnosis Date  . Abnormal Pap smear   . Breast cancer ([place])   . Elevated hemoglobin A1c June 2017   5.9  . Family history of pancreatic cancer   . GERD (gastroesophageal reflux disease)   . Migraine    uses imitrex as needed  . Osteopenia   . Osteoporosis   . Schatzki's ring    and hiatal hernia on EGD  . Shingles rash 07/2015    shingles on right rib cage   . TMJ (temporomandibular joint syndrome)   . Vertigo     SURGICAL HISTORY: Past Surgical History:  Procedure Laterality Date  . BREAST BIOPSY  1/12   fibrocystic changes   . BREAST LUMPECTOMY WITH RADIOACTIVE SEED AND SENTINEL LYMPH NODE BIOPSY Left 04/22/2016   Procedure: BREAST LUMPECTOMY WITH RADIOACTIVE SEED AND SENTINEL LYMPH NODE BIOPSY;  Surgeon: Excell Seltzer, MD;  Location: [place];  Service: General;  Laterality: Left;  . DILATION AND CURETTAGE OF UTERUS  1991  . ESOPHAGEAL DILATION    . GYNECOLOGIC CRYOSURGERY  1980s  . LAPAROSCOPIC CHOLECYSTECTOMY  1992  . WISDOM TOOTH EXTRACTION      I have reviewed the social history and family history with the patient and they are unchanged from previous note.  ALLERGIES:  is allergic to oxycodone-acetaminophen, tylox [oxycodone-acetaminophen], zoledronic acid, and penicillins.  MEDICATIONS:  Current Outpatient Medications  Medication Sig Dispense Refill  . CALCIUM PO Take by mouth daily.    . Cholecalciferol (VITAMIN D PO) Take 2,000 Units by mouth every other day.     . COLLAGEN PO Take by mouth.    . letrozole (FEMARA) 2.5 MG tablet TAKE 1 TABLET(2.5 MG) BY MOUTH DAILY 30 tablet 5  . Magnesium 100 MG CAPS Take 2 capsules by mouth daily.    . SUMAtriptan (IMITREX) 100 MG tablet May repeat in 2  hours if headache persists or recurs. 9 tablet 6   No current facility-administered medications for this visit.    PHYSICAL EXAMINATION: ECOG PERFORMANCE STATUS: 0 - Asymptomatic  Vitals:   01/02/21 1009  BP: 110/75  Pulse: 62  Resp: 17  Temp: 97.7 F (36.5 C)  SpO2: 99%   Filed Weights   01/02/21 1009  Weight: 146 lb 9.6 oz (66.5 kg)    GENERAL:alert, no distress and comfortable SKIN: skin color, texture, turgor are normal, no rashes or significant lesions EYES: normal, Conjunctiva are pink and non-injected, sclera clear NECK: supple, thyroid normal size, non-tender, without nodularity LYMPH:  no palpable lymphadenopathy in the cervical, axillary  LUNGS: clear to auscultation and percussion with normal breathing effort HEART: regular rate & rhythm and no murmurs and no lower extremity edema ABDOMEN:abdomen soft, non-tender and normal bowel sounds Musculoskeletal:no cyanosis of digits and no clubbing  NEURO: alert & oriented x 3 with fluent speech, no focal motor/sensory deficits Breasts: Breast inspection showed them to be symmetrical with no nipple discharge.  Surgical incision in the right axilla has mild scar tissue. Palpation of the breasts and axilla revealed no obvious mass that I could appreciate.   LABORATORY DATA:  I have reviewed the data as listed CBC Latest Ref Rng & Units 01/02/2021 07/02/2020 05/14/2020  WBC 4.0 - 10.5 K/uL 5.2 6.3 6.2  Hemoglobin 12.0 - 15.0 g/dL 12.7 12.2 13.5  Hematocrit 36.0 - 46.0 % 37.4 37.8 40.7  Platelets 150 - 400 K/uL 306 278 308     CMP Latest Ref Rng & Units 01/02/2021 07/02/2020 05/14/2020  Glucose 70 - 99 mg/dL 119(H) 107(H) 107(H)  BUN 8 - 23 mg/dL 14 17 16   Creatinine 0.44 - 1.00 mg/dL 0.79 0.83 0.87  Sodium 135 - 145 mmol/L 138 138 139  Potassium 3.5 - 5.1 mmol/L 4.3 4.7 4.1  Chloride 98 - 111 mmol/L 103 104 103  CO2 22 - 32 mmol/L 25 29 28   Calcium 8.9 - 10.3 mg/dL 9.5 9.8 10.6(H)  Total Protein 6.5 - 8.1 g/dL 7.4 7.4 8.1   Total Bilirubin 0.3 - 1.2 mg/dL 0.5 0.5 0.4  Alkaline Phos 38 - 126 U/L 57 61 65  AST 15 - 41 U/L 22 21 21   ALT 0 - 44 U/L 21 21 21       RADIOGRAPHIC STUDIES: I have personally reviewed the radiological images as listed and agreed with the findings in the report. No results found.   ASSESSMENT & PLAN:  Tiffany Nash is a 60 y.o. female with   1. Breast cancer of lower-outer quadrant of left female breast, invasive and in situ ductal carcinoma, grade 1, pT1cN0M0, stage IA, ER 100% positive, PR 70% positive, HER-2 negative, RS 17 -She was diagnosed in 03/2016. She is s/p left breast lumpectomy and adjuvantradiation.  -She has low risk based on the recurrence score, which predicts 10 year distant recurrence after 5 years of tamoxifen 11%. She does not need adjuvant chemotherapy -She started anti-estrogen therapy with letrozole in 07/2016.Due to Osteoporosis I switched her to Tamoxifen in 09/2018 but did not tolerate well due to insomnia and vaginal discharge.I switched her back to Letrozole in 12/2018.  -She is clinically doing well. Lab reviewed, her CBC and CMP are within normal limits. Her physical exam and her 07/2020 mammogram were unremarkable. There is no clinical concern for recurrence. -She is tolerating letrozole well, will continue.  Plan to repeat a bone density scan in January 2023, if she has worsening osteoporosis, will stop then  -F/u in 6 months  2. Osteoporosis -Her 08/2018 DEXA from [facility] T-score of -2.8 at [facility]. -Her 10/2019 DEXA showed improvement with lowest T-score -2.4 at [facility].  -She tried Zometa on 01/02/19 but stopped after severe flu like reaction which lasted about 2 weeks. -continue weight bearing exercisesand dailycalcium and vitamin D, she is doing well on that -Repeated next bone density scan in January 2023  3. Insomnia  -We discussed sleep hygiene  -She previously tried [place], did not tolerate  well.   Plan -She is clinically doing well. -Lab and F/u in 6 months  -Continue Letrozole  -We will order mammogram and bone density scan on next visit   No problem-specific Assessment & Plan notes found for this encounter.   No orders of the defined types were placed in this encounter.  All questions were answered. The patient knows to call the clinic with any problems, questions or concerns. No barriers to learning was detected. The total time spent in the appointment  was 30 minutes.     Truitt Merle, MD 01/02/2021   I, Joslyn Devon, am acting as scribe for Truitt Merle, MD.   I have reviewed the above documentation for accuracy and completeness, and I agree with the above.

## 2021-01-01 ENCOUNTER — Other Ambulatory Visit: Payer: Self-pay

## 2021-01-01 ENCOUNTER — Encounter (HOSPITAL_BASED_OUTPATIENT_CLINIC_OR_DEPARTMENT_OTHER): Payer: Self-pay | Admitting: Obstetrics & Gynecology

## 2021-01-01 ENCOUNTER — Ambulatory Visit (INDEPENDENT_AMBULATORY_CARE_PROVIDER_SITE_OTHER): Payer: BC Managed Care – PPO | Admitting: Obstetrics & Gynecology

## 2021-01-01 VITALS — BP 126/70 | HR 62 | Ht 66.0 in | Wt 144.2 lb

## 2021-01-01 DIAGNOSIS — Z800 Family history of malignant neoplasm of digestive organs: Secondary | ICD-10-CM

## 2021-01-01 DIAGNOSIS — C50412 Malignant neoplasm of upper-outer quadrant of left female breast: Secondary | ICD-10-CM

## 2021-01-01 DIAGNOSIS — Z170 Estrogen receptor positive status [ER+]: Secondary | ICD-10-CM

## 2021-01-01 DIAGNOSIS — Z0000 Encounter for general adult medical examination without abnormal findings: Secondary | ICD-10-CM

## 2021-01-01 DIAGNOSIS — F4320 Adjustment disorder, unspecified: Secondary | ICD-10-CM | POA: Diagnosis not present

## 2021-01-01 DIAGNOSIS — M816 Localized osteoporosis [Lequesne]: Secondary | ICD-10-CM

## 2021-01-01 DIAGNOSIS — G43109 Migraine with aura, not intractable, without status migrainosus: Secondary | ICD-10-CM

## 2021-01-01 DIAGNOSIS — M810 Age-related osteoporosis without current pathological fracture: Secondary | ICD-10-CM | POA: Insufficient documentation

## 2021-01-01 MED ORDER — SUMATRIPTAN SUCCINATE 100 MG PO TABS: ORAL_TABLET | ORAL | 6 refills | Status: DC

## 2021-01-01 NOTE — Progress Notes (Addendum)
60 y.o. [id number] Married White or Caucasian female here for annual exam.  Doing well.  Denies vaginal bleeding.  Has appt with Dr. Burr Medico tomorrow.  Had reaction to Zometa.  Last BMD 2020/01/09.    Brother died with pancreatic cancer.  Has appt with Dr. Cristina Gong for screening colonoscopy.    Patient's last menstrual period was 05/12/2012.          Sexually active: Yes.    The current method of family planning is post menopausal status.    Exercising: Yes.     Smoker:  no  Health Maintenance: Pap:  08/2018 MMG:  07/2020 Colonoscopy:  January 08, 2010, due.  Has appt with Dr. Cristina Gong next month. BMD:   10/17/2019, -2.4.  Seeing Dr. Burr Medico tomorrow.   TDaP:  09-Jan-2012 Pneumonia vaccine(s):  Not indicated Shingrix:   Not completed Hep C testing: 03/2016 neg Screening Labs: 08/2018   reports that she has never smoked. She has never used smokeless tobacco. She reports current alcohol use of about 4.0 standard drinks of alcohol per week. She reports that she does not use drugs.  Past Medical History:  Diagnosis Date  . Abnormal Pap smear   . Breast cancer ([place])   . Elevated hemoglobin A1c June 2017   5.9  . Family history of pancreatic cancer   . GERD (gastroesophageal reflux disease)   . Migraine    uses imitrex as needed  . Osteopenia   . Osteoporosis   . Schatzki's ring    and hiatal hernia on EGD  . Shingles rash 07/2015    shingles on right rib cage   . TMJ (temporomandibular joint syndrome)   . Vertigo     Past Surgical History:  Procedure Laterality Date  . BREAST BIOPSY  1/12   fibrocystic changes   . BREAST LUMPECTOMY WITH RADIOACTIVE SEED AND SENTINEL LYMPH NODE BIOPSY Left 04/22/2016   Procedure: BREAST LUMPECTOMY WITH RADIOACTIVE SEED AND SENTINEL LYMPH NODE BIOPSY;  Surgeon: Excell Seltzer, MD;  Location: [facility];  Service: General;  Laterality: Left;  . DILATION AND CURETTAGE OF UTERUS  1991  . ESOPHAGEAL DILATION    . GYNECOLOGIC CRYOSURGERY  1980s  . LAPAROSCOPIC  CHOLECYSTECTOMY  1992  . WISDOM TOOTH EXTRACTION      Current Outpatient Medications  Medication Sig Dispense Refill  . CALCIUM PO Take by mouth daily.    . Cholecalciferol (VITAMIN D PO) Take 2,000 Units by mouth every other day.     . letrozole (FEMARA) 2.5 MG tablet TAKE 1 TABLET(2.5 MG) BY MOUTH DAILY 30 tablet 5  . Magnesium 100 MG CAPS Take 2 capsules by mouth daily.    . SUMAtriptan (IMITREX) 100 MG tablet TAKE 1 TABLET BY MOUTH 1 TIME FOR 1 DOSE. MAY REPEAT IN 2 HOURS IF HEADACHE PERSISTS OR RECURS 9 tablet 0  . COLLAGEN PO Take by mouth.     No current facility-administered medications for this visit.    Family History  Problem Relation Age of Onset  . Diabetes Father   . Goiter Mother   . Heart disease Other   . Pancreatic cancer Brother 22       no genetic testing  . Pancreatic cancer Maternal Grandfather        dx. early 50s  . ALS Maternal Aunt   . Heart Problems Maternal Grandmother   . Breast cancer Other        dx. 51s; maternal second cousin  . ALS Cousin  Review of Systems  All other systems reviewed and are negative.   Exam:   BP 126/70   Pulse 62   Ht 5\' 6"  (1.676 m)   Wt 144 lb 3.2 oz (65.4 kg)   LMP 05/12/2012   SpO2 100%   BMI 23.27 kg/m   Height: 5\' 6"  (167.6 cm)  General appearance: alert, cooperative and appears stated age Head: Normocephalic, without obvious abnormality, atraumatic Neck: no adenopathy, supple, symmetrical, trachea midline and thyroid normal to inspection and palpation Lungs: clear to auscultation bilaterally Breasts: normal appearance, no masses or tenderness Heart: regular rate and rhythm Abdomen: soft, non-tender; bowel sounds normal; no masses,  no organomegaly Extremities: extremities normal, atraumatic, no cyanosis or edema Skin: Skin color, texture, turgor normal. No rashes or lesions Lymph nodes: Cervical, supraclavicular, and axillary nodes normal. No abnormal inguinal nodes palpated Neurologic: Grossly  normal   Pelvic: External genitalia:  5x50mm left vulvar brown lesion              Urethra:  normal appearing urethra with no masses, tenderness or lesions              Bartholins and Skenes: normal                 Vagina: normal appearing vagina with normal color and discharge, no lesions              Cervix: no lesions              Pap taken: No. Bimanual Exam:  Uterus:  normal size, contour, position, consistency, mobility, non-tender              Adnexa: normal adnexa and no mass, fullness, tenderness               Rectovaginal: Confirms               Anus:  normal sphincter tone, no lesions  Chaperone, Britt Bottom, CMA, was present for exam.  Assessment/Plan: 1. Well woman exam without gynecological exam - pap 11/20219 - has appt with Dr. Burr Medico tomorrow.  Will discuss BMD. - has appt for colonoscopy with Dr. Cristina Gong next month - lab work done late 2019.  Plan again next year with me or PCP.  2. Migraine with aura and without status migrainosus, not intractable - SUMAtriptan (IMITREX) 100 MG tablet; May repeat in 2 hours if headache persists or recurs.  Dispense: 9 tablet; Refill: 6  3. Malignant neoplasm of upper-outer quadrant of left breast in female, estrogen receptor positive ([place]) - continues to be followed at the [facility]  4. Family history of pancreatic cancer  5. Localized osteoporosis without current pathological fracture

## 2021-01-02 ENCOUNTER — Encounter: Payer: Self-pay | Admitting: Hematology

## 2021-01-02 ENCOUNTER — Inpatient Hospital Stay: Payer: BC Managed Care – PPO | Attending: Hematology | Admitting: Hematology

## 2021-01-02 ENCOUNTER — Inpatient Hospital Stay: Payer: BC Managed Care – PPO

## 2021-01-02 VITALS — BP 110/75 | HR 62 | Temp 97.7°F | Resp 17 | Ht 66.0 in | Wt 146.6 lb

## 2021-01-02 DIAGNOSIS — Z800 Family history of malignant neoplasm of digestive organs: Secondary | ICD-10-CM | POA: Insufficient documentation

## 2021-01-02 DIAGNOSIS — R232 Flushing: Secondary | ICD-10-CM | POA: Diagnosis not present

## 2021-01-02 DIAGNOSIS — C50412 Malignant neoplasm of upper-outer quadrant of left female breast: Secondary | ICD-10-CM | POA: Diagnosis not present

## 2021-01-02 DIAGNOSIS — Z79811 Long term (current) use of aromatase inhibitors: Secondary | ICD-10-CM | POA: Diagnosis not present

## 2021-01-02 DIAGNOSIS — Z880 Allergy status to penicillin: Secondary | ICD-10-CM | POA: Diagnosis not present

## 2021-01-02 DIAGNOSIS — Z9049 Acquired absence of other specified parts of digestive tract: Secondary | ICD-10-CM | POA: Diagnosis not present

## 2021-01-02 DIAGNOSIS — M810 Age-related osteoporosis without current pathological fracture: Secondary | ICD-10-CM | POA: Insufficient documentation

## 2021-01-02 DIAGNOSIS — G4700 Insomnia, unspecified: Secondary | ICD-10-CM | POA: Diagnosis not present

## 2021-01-02 DIAGNOSIS — M816 Localized osteoporosis [Lequesne]: Secondary | ICD-10-CM

## 2021-01-02 DIAGNOSIS — Z170 Estrogen receptor positive status [ER+]: Secondary | ICD-10-CM | POA: Insufficient documentation

## 2021-01-02 LAB — CBC WITH DIFFERENTIAL (CANCER CENTER ONLY)
Abs Immature Granulocytes: 0.01 10*3/uL (ref 0.00–0.07)
Basophils Absolute: 0 10*3/uL (ref 0.0–0.1)
Basophils Relative: 1 %
Eosinophils Absolute: 0.4 10*3/uL (ref 0.0–0.5)
Eosinophils Relative: 8 %
HCT: 37.4 % (ref 36.0–46.0)
Hemoglobin: 12.7 g/dL (ref 12.0–15.0)
Immature Granulocytes: 0 %
Lymphocytes Relative: 27 %
Lymphs Abs: 1.4 10*3/uL (ref 0.7–4.0)
MCH: 32.4 pg (ref 26.0–34.0)
MCHC: 34 g/dL (ref 30.0–36.0)
MCV: 95.4 fL (ref 80.0–100.0)
Monocytes Absolute: 0.4 10*3/uL (ref 0.1–1.0)
Monocytes Relative: 8 %
Neutro Abs: 2.9 10*3/uL (ref 1.7–7.7)
Neutrophils Relative %: 56 %
Platelet Count: 306 10*3/uL (ref 150–400)
RBC: 3.92 MIL/uL (ref 3.87–5.11)
RDW: 13 % (ref 11.5–15.5)
WBC Count: 5.2 10*3/uL (ref 4.0–10.5)
nRBC: 0 % (ref 0.0–0.2)

## 2021-01-02 LAB — CMP (CANCER CENTER ONLY)
ALT: 21 U/L (ref 0–44)
AST: 22 U/L (ref 15–41)
Albumin: 4.4 g/dL (ref 3.5–5.0)
Alkaline Phosphatase: 57 U/L (ref 38–126)
Anion gap: 10 (ref 5–15)
BUN: 14 mg/dL (ref 8–23)
CO2: 25 mmol/L (ref 22–32)
Calcium: 9.5 mg/dL (ref 8.9–10.3)
Chloride: 103 mmol/L (ref 98–111)
Creatinine: 0.79 mg/dL (ref 0.44–1.00)
GFR, Estimated: >60 mL/min (ref >60)
Glucose, Bld: 119 mg/dL — ABNORMAL HIGH (ref 70–99)
Potassium: 4.3 mmol/L (ref 3.5–5.1)
Sodium: 138 mmol/L (ref 135–145)
Total Bilirubin: 0.5 mg/dL (ref 0.3–1.2)
Total Protein: 7.4 g/dL (ref 6.5–8.1)

## 2021-01-02 LAB — VITAMIN D 25 HYDROXY (VIT D DEFICIENCY, FRACTURES): Vit D, 25-Hydroxy: 48.35 ng/mL (ref 30–100)

## 2021-01-08 DIAGNOSIS — R1319 Other dysphagia: Secondary | ICD-10-CM | POA: Diagnosis not present

## 2021-01-08 DIAGNOSIS — Z800 Family history of malignant neoplasm of digestive organs: Secondary | ICD-10-CM | POA: Diagnosis not present

## 2021-01-13 DIAGNOSIS — E559 Vitamin D deficiency, unspecified: Secondary | ICD-10-CM | POA: Diagnosis not present

## 2021-01-13 DIAGNOSIS — Z13220 Encounter for screening for lipoid disorders: Secondary | ICD-10-CM | POA: Diagnosis not present

## 2021-01-13 DIAGNOSIS — Z131 Encounter for screening for diabetes mellitus: Secondary | ICD-10-CM | POA: Diagnosis not present

## 2021-01-13 DIAGNOSIS — Z0000 Encounter for general adult medical examination without abnormal findings: Secondary | ICD-10-CM | POA: Diagnosis not present

## 2021-01-15 DIAGNOSIS — F4320 Adjustment disorder, unspecified: Secondary | ICD-10-CM | POA: Diagnosis not present

## 2021-01-29 DIAGNOSIS — F4320 Adjustment disorder, unspecified: Secondary | ICD-10-CM | POA: Diagnosis not present

## 2021-02-12 DIAGNOSIS — F4320 Adjustment disorder, unspecified: Secondary | ICD-10-CM | POA: Diagnosis not present

## 2021-02-18 ENCOUNTER — Encounter: Payer: Self-pay | Admitting: Hematology

## 2021-02-26 DIAGNOSIS — F4320 Adjustment disorder, unspecified: Secondary | ICD-10-CM | POA: Diagnosis not present

## 2021-03-12 ENCOUNTER — Emergency Department (HOSPITAL_BASED_OUTPATIENT_CLINIC_OR_DEPARTMENT_OTHER)
Admission: EM | Admit: 2021-03-12 | Discharge: 2021-03-12 | Disposition: A | Discharge status: Home / Self Care | Payer: BC Managed Care – PPO | Attending: Emergency Medicine | Admitting: Emergency Medicine

## 2021-03-12 ENCOUNTER — Other Ambulatory Visit: Payer: Self-pay

## 2021-03-12 ENCOUNTER — Emergency Department (HOSPITAL_BASED_OUTPATIENT_CLINIC_OR_DEPARTMENT_OTHER): Payer: BC Managed Care – PPO

## 2021-03-12 ENCOUNTER — Encounter (HOSPITAL_BASED_OUTPATIENT_CLINIC_OR_DEPARTMENT_OTHER): Payer: Self-pay | Admitting: *Deleted

## 2021-03-12 DIAGNOSIS — Z853 Personal history of malignant neoplasm of breast: Secondary | ICD-10-CM | POA: Diagnosis not present

## 2021-03-12 DIAGNOSIS — K5732 Diverticulitis of large intestine without perforation or abscess without bleeding: Secondary | ICD-10-CM | POA: Diagnosis not present

## 2021-03-12 DIAGNOSIS — R1031 Right lower quadrant pain: Secondary | ICD-10-CM | POA: Diagnosis not present

## 2021-03-12 LAB — COMPREHENSIVE METABOLIC PANEL
ALT: 100 U/L — ABNORMAL HIGH (ref 0–44)
AST: 97 U/L — ABNORMAL HIGH (ref 15–41)
Albumin: 4.2 g/dL (ref 3.5–5.0)
Alkaline Phosphatase: 108 U/L (ref 38–126)
Anion gap: 9 (ref 5–15)
BUN: 12 mg/dL (ref 8–23)
CO2: 26 mmol/L (ref 22–32)
Calcium: 9.5 mg/dL (ref 8.9–10.3)
Chloride: 100 mmol/L (ref 98–111)
Creatinine, Ser: 0.7 mg/dL (ref 0.44–1.00)
GFR, Estimated: >60 mL/min (ref >60)
Glucose, Bld: 123 mg/dL — ABNORMAL HIGH (ref 70–99)
Potassium: 4 mmol/L (ref 3.5–5.1)
Sodium: 135 mmol/L (ref 135–145)
Total Bilirubin: 0.5 mg/dL (ref 0.3–1.2)
Total Protein: 6.8 g/dL (ref 6.5–8.1)

## 2021-03-12 LAB — CBC WITH DIFFERENTIAL/PLATELET
Abs Immature Granulocytes: 0.04 10*3/uL (ref 0.00–0.07)
Basophils Absolute: 0 10*3/uL (ref 0.0–0.1)
Basophils Relative: 0 %
Eosinophils Absolute: 0.2 10*3/uL (ref 0.0–0.5)
Eosinophils Relative: 1 %
HCT: 38.9 % (ref 36.0–46.0)
Hemoglobin: 12.8 g/dL (ref 12.0–15.0)
Immature Granulocytes: 0 %
Lymphocytes Relative: 10 %
Lymphs Abs: 1.3 10*3/uL (ref 0.7–4.0)
MCH: 31.6 pg (ref 26.0–34.0)
MCHC: 32.9 g/dL (ref 30.0–36.0)
MCV: 96 fL (ref 80.0–100.0)
Monocytes Absolute: 1.2 10*3/uL — ABNORMAL HIGH (ref 0.1–1.0)
Monocytes Relative: 10 %
Neutro Abs: 9.4 10*3/uL — ABNORMAL HIGH (ref 1.7–7.7)
Neutrophils Relative %: 79 %
Platelets: 260 10*3/uL (ref 150–400)
RBC: 4.05 MIL/uL (ref 3.87–5.11)
RDW: 13 % (ref 11.5–15.5)
WBC: 12.2 10*3/uL — ABNORMAL HIGH (ref 4.0–10.5)
nRBC: 0 % (ref 0.0–0.2)

## 2021-03-12 LAB — URINALYSIS, ROUTINE W REFLEX MICROSCOPIC
Bilirubin Urine: NEGATIVE
Glucose, UA: NEGATIVE mg/dL
Hgb urine dipstick: NEGATIVE
Ketones, ur: NEGATIVE mg/dL
Leukocytes,Ua: NEGATIVE
Nitrite: NEGATIVE
Protein, ur: NEGATIVE mg/dL
Specific Gravity, Urine: 1.006 (ref 1.005–1.030)
pH: 7.5 (ref 5.0–8.0)

## 2021-03-12 LAB — LIPASE, BLOOD: Lipase: 33 U/L (ref 11–51)

## 2021-03-12 MED ORDER — METRONIDAZOLE 500 MG PO TABS: 500.0000 mg | ORAL_TABLET | Freq: Two times a day (BID) | ORAL | 0 refills | Status: DC

## 2021-03-12 MED ORDER — CIPROFLOXACIN HCL 500 MG PO TABS: 500.0000 mg | ORAL_TABLET | Freq: Two times a day (BID) | ORAL | 0 refills | Status: DC

## 2021-03-12 NOTE — ED Triage Notes (Signed)
Bilateral lower abdominal pain and left lower back pain since Sunday.  After she ate something, abdominal pa in cramping noted.

## 2021-03-12 NOTE — ED Notes (Signed)
Patient transported to CT 

## 2021-03-12 NOTE — ED Provider Notes (Signed)
[facility]ER[facility] DEPT Provider Note   CSN: [id number] Arrival date & time: 03/12/21  1027     History Chief Complaint  Patient presents with  . Abdominal Pain  . Back Pain    Tiffany Nash is a 60 y.o. female.  60 year old female with past medical history including breast cancer, TMJ, migraines, osteoporosis, GERD who presents with abdominal pain.  3 days ago, patient noticed some crampy lower abdominal pain just before she had a bowel movement.  Pain eventually subsided but she has had several more episodes of cramping before bowel movements and feeling the urge to defecate. Pain occasionally goes to b/l lower back.  This morning for the first time she had diarrhea.  She has not noticed any blood in her stools.  She has had associated malaise for the past 2 days and has felt hot but has not measured her temperature.  She took Motrin a few days ago.  She denies any associated nausea, vomiting, URI symptoms, or sick contacts.  She denies history of diverticulitis.  The history is provided by the patient.  Abdominal Pain Back Pain Associated symptoms: abdominal pain        Past Medical History:  Diagnosis Date  . Abnormal Pap smear   . Breast cancer ([place])   . Elevated hemoglobin A1c June 2017   5.9  . Family history of pancreatic cancer   . GERD (gastroesophageal reflux disease)   . Migraine    uses imitrex as needed  . Osteopenia   . Osteoporosis   . Schatzki's ring    and hiatal hernia on EGD  . Shingles rash 07/2015    shingles on right rib cage   . TMJ (temporomandibular joint syndrome)   . Vertigo     Patient Active Problem List   Diagnosis Date Noted  . Age-related osteoporosis without current pathological fracture 01/01/2021  . Genetic testing 05/28/2020  . Family history of pancreatic cancer   . Osteoporosis 12/16/2018  . Breast cancer of upper-outer quadrant of left female breast ([place]) 04/10/2016  . History of shingles 03/25/2016  .  Rapid heart rate 08/16/2013  . Family history of early CAD 08/16/2013  . Migraine with aura   . TMJ (temporomandibular joint syndrome)   . Abnormal Pap smear   . Schatzki's ring   . Vertigo     Past Surgical History:  Procedure Laterality Date  . BREAST BIOPSY  1/12   fibrocystic changes   . BREAST LUMPECTOMY WITH RADIOACTIVE SEED AND SENTINEL LYMPH NODE BIOPSY Left 04/22/2016   Procedure: BREAST LUMPECTOMY WITH RADIOACTIVE SEED AND SENTINEL LYMPH NODE BIOPSY;  Surgeon: Excell Seltzer, MD;  Location: [facility];  Service: General;  Laterality: Left;  . DILATION AND CURETTAGE OF UTERUS  1991  . ESOPHAGEAL DILATION    . GYNECOLOGIC CRYOSURGERY  1980s  . LAPAROSCOPIC CHOLECYSTECTOMY  1992  . WISDOM TOOTH EXTRACTION       OB History    Gravida  7   Para  4   Term      Preterm      AB  3   Living  4     SAB  3   IAB      Ectopic      Multiple      Live Births              Family History  Problem Relation Age of Onset  . Diabetes Father   . Goiter Mother   .  Heart disease Other   . Pancreatic cancer Brother 70       no genetic testing  . Pancreatic cancer Maternal Grandfather        dx. early 26s  . ALS Maternal Aunt   . Heart Problems Maternal Grandmother   . Breast cancer Other        dx. 70s; maternal second cousin  . ALS Cousin     Social History   Tobacco Use  . Smoking status: Never Smoker  . Smokeless tobacco: Never Used  Vaping Use  . Vaping Use: Never used  Substance Use Topics  . Alcohol use: Yes    Alcohol/week: 4.0 standard drinks    Types: 4 Standard drinks or equivalent per week    Comment: weekends only   . Drug use: No    Home Medications Prior to Admission medications   Medication Sig Start Date End Date Taking? Authorizing Provider  CALCIUM PO Take by mouth daily.   Yes [provider]  Cholecalciferol (VITAMIN D PO) Take 2,000 Units by mouth every other day.    Yes [provider]   ciprofloxacin (CIPRO) 500 MG tablet Take 1 tablet (500 mg total) by mouth 2 (two) times daily. 03/12/21  Yes Debbie Bellucci, Wenda Overland, MD  COLLAGEN PO Take by mouth.   Yes [provider]  letrozole (FEMARA) 2.5 MG tablet TAKE 1 TABLET(2.5 MG) BY MOUTH DAILY 12/03/20  Yes Truitt Merle, MD  Magnesium 100 MG CAPS Take 2 capsules by mouth daily.   Yes [provider]  metroNIDAZOLE (FLAGYL) 500 MG tablet Take 1 tablet (500 mg total) by mouth 2 (two) times daily. 03/12/21  Yes Luken Shadowens, Wenda Overland, MD  SUMAtriptan (IMITREX) 100 MG tablet May repeat in 2 hours if headache persists or recurs. 01/01/21  Yes Megan Salon, MD    Allergies    Oxycodone-acetaminophen, Tylox [oxycodone-acetaminophen], Zoledronic acid, and Penicillins  Review of Systems   Review of Systems  Gastrointestinal: Positive for abdominal pain.  Musculoskeletal: Positive for back pain.   All other systems reviewed and are negative except that which was mentioned in HPI  Physical Exam Updated Vital Signs BP 124/75   Pulse 71   Temp 98.9 F (37.2 C) (Oral)   Resp 12   Ht 5\' 6"  (4.259 m)   Wt 62.1 kg   LMP 05/12/2012   SpO2 100%   BMI 22.11 kg/m   Physical Exam Constitutional:      General: She is not in acute distress.    Appearance: Normal appearance.  HENT:     Head: Normocephalic and atraumatic.     Mouth/Throat:     Mouth: Mucous membranes are moist.     Pharynx: Oropharynx is clear.  Eyes:     Conjunctiva/sclera: Conjunctivae normal.  Cardiovascular:     Rate and Rhythm: Normal rate and regular rhythm.     Heart sounds: Normal heart sounds. No murmur heard.   Pulmonary:     Effort: Pulmonary effort is normal.     Breath sounds: Normal breath sounds.  Abdominal:     General: Abdomen is flat. Bowel sounds are normal. There is no distension.     Palpations: Abdomen is soft.     Tenderness: There is abdominal tenderness (mild) in the right lower quadrant and left lower quadrant. There is no  guarding or rebound.  Musculoskeletal:     Right lower leg: No edema.     Left lower leg: No edema.  Skin:  General: Skin is warm and dry.  Neurological:     Mental Status: She is alert and oriented to person, place, and time.     Comments: fluent  Psychiatric:        Mood and Affect: Mood normal.        Behavior: Behavior normal.     ED Results / Procedures / Treatments   Labs (all labs ordered are listed, but only abnormal results are displayed) Labs Reviewed  COMPREHENSIVE METABOLIC PANEL - Abnormal; Notable for the following components:      Result Value   Glucose, Bld 123 (*)    AST 97 (*)    ALT 100 (*)    All other components within normal limits  CBC WITH DIFFERENTIAL/PLATELET - Abnormal; Notable for the following components:   WBC 12.2 (*)    Neutro Abs 9.4 (*)    Monocytes Absolute 1.2 (*)    All other components within normal limits  URINALYSIS, ROUTINE W REFLEX MICROSCOPIC - Abnormal; Notable for the following components:   Color, Urine COLORLESS (*)    All other components within normal limits  LIPASE, BLOOD    EKG None  Radiology CT Abdomen Pelvis Wo Contrast  Result Date: 03/12/2021 CLINICAL DATA:  Lower abdominal and left low back pain since 03/09/2021. EXAM: CT ABDOMEN AND PELVIS WITHOUT CONTRAST TECHNIQUE: Multidetector CT imaging of the abdomen and pelvis was performed following the standard protocol without IV contrast. COMPARISON:  CT abdomen and pelvis 09/29/2016. FINDINGS: Lower chest: The lung bases are clear. No pleural or pericardial effusion. Hepatobiliary: No focal liver abnormality is seen. Status post cholecystectomy. No biliary dilatation. Pancreas: Unremarkable. No pancreatic ductal dilatation or surrounding inflammatory changes. Spleen: Normal in size without focal abnormality. Adrenals/Urinary Tract: Adrenal glands are unremarkable. Kidneys are normal, without renal calculi, focal lesion, or hydronephrosis. Bladder is unremarkable.  Stomach/Bowel: The patient has sigmoid diverticulosis. There is extensive stranding and wall thickening about the mid sigmoid colon consistent with acute diverticulitis. No perforation or abscess is identified. The stomach, small bowel and appendix appear normal. Vascular/Lymphatic: Aortic atherosclerosis. No enlarged abdominal or pelvic lymph nodes. Reproductive: Uterus and bilateral adnexa are unremarkable. Other: None. Musculoskeletal: Negative. IMPRESSION: The examination is positive for acute sigmoid diverticulitis. No abscess or perforation is identified. Aortic Atherosclerosis (ICD10-I70.0). Electronically Signed   By: Inge Kelaiah M.D.   On: 03/12/2021 11:33    Procedures Procedures   Medications Ordered in ED Medications - No data to display  ED Course  I have reviewed the triage vital signs and the nursing notes.  Pertinent labs & imaging results that were available during my care of the patient were reviewed by me and considered in my medical decision making (see chart for details).    MDM Rules/Calculators/A&P                          Well-appearing on exam, normal vital signs.  Differential includes diverticulitis, colitis, UTI, kidney stone.  Obtained labs and CT of abdomen and pelvis.  Lab work shows normal creatinine, UA without evidence of infection or hematuria, AST 97, ALT 100, normal bilirubin, WBC 12.2, normal lipase.  CT shows uncomplicated acute sigmoid diverticulitis which is consistent with patient's symptoms.  She states intolerance to penicillins therefore will treat with ciprofloxacin and Flagyl.  She is otherwise well-appearing afebrile and tolerating p.o. therefore I feel she is appropriate for outpatient management.  I discussed incidental finding of mildly elevated LFTs which may  be related to heavier drinking over the holiday weekend.  Recommended abstaining from alcohol and follow-up with GI or PCP for recheck in the future.  I have discussed abortive  measures for his symptoms and reviewed return precautions including worsening pain, high fevers, bloody stools.  Patient voiced understanding. Final Clinical Impression(s) / ED Diagnoses Final diagnoses:  Sigmoid diverticulitis    Rx / DC Orders ED Discharge Orders         Ordered    metroNIDAZOLE (FLAGYL) 500 MG tablet  2 times daily        03/12/21 1157    ciprofloxacin (CIPRO) 500 MG tablet  2 times daily        03/12/21 1157           Marcene Laskowski, Wenda Overland, MD 03/12/21 1300

## 2021-03-25 DIAGNOSIS — K5792 Diverticulitis of intestine, part unspecified, without perforation or abscess without bleeding: Secondary | ICD-10-CM | POA: Diagnosis not present

## 2021-03-25 DIAGNOSIS — R7401 Elevation of levels of liver transaminase levels: Secondary | ICD-10-CM | POA: Diagnosis not present

## 2021-03-25 DIAGNOSIS — R1030 Lower abdominal pain, unspecified: Secondary | ICD-10-CM | POA: Diagnosis not present

## 2021-03-25 DIAGNOSIS — R198 Other specified symptoms and signs involving the digestive system and abdomen: Secondary | ICD-10-CM | POA: Diagnosis not present

## 2021-03-26 DIAGNOSIS — F4320 Adjustment disorder, unspecified: Secondary | ICD-10-CM | POA: Diagnosis not present

## 2021-04-23 DIAGNOSIS — F4320 Adjustment disorder, unspecified: Secondary | ICD-10-CM | POA: Diagnosis not present

## 2021-05-20 DIAGNOSIS — K2930 Chronic superficial gastritis without bleeding: Secondary | ICD-10-CM | POA: Diagnosis not present

## 2021-05-20 DIAGNOSIS — Z1211 Encounter for screening for malignant neoplasm of colon: Secondary | ICD-10-CM | POA: Diagnosis not present

## 2021-05-20 DIAGNOSIS — R12 Heartburn: Secondary | ICD-10-CM | POA: Diagnosis not present

## 2021-05-20 DIAGNOSIS — K222 Esophageal obstruction: Secondary | ICD-10-CM | POA: Diagnosis not present

## 2021-05-20 DIAGNOSIS — K3189 Other diseases of stomach and duodenum: Secondary | ICD-10-CM | POA: Diagnosis not present

## 2021-05-27 DIAGNOSIS — F4320 Adjustment disorder, unspecified: Secondary | ICD-10-CM | POA: Diagnosis not present

## 2021-06-18 DIAGNOSIS — F4320 Adjustment disorder, unspecified: Secondary | ICD-10-CM | POA: Diagnosis not present

## 2021-06-25 DIAGNOSIS — M9901 Segmental and somatic dysfunction of cervical region: Secondary | ICD-10-CM | POA: Diagnosis not present

## 2021-06-25 DIAGNOSIS — M5384 Other specified dorsopathies, thoracic region: Secondary | ICD-10-CM | POA: Diagnosis not present

## 2021-06-25 DIAGNOSIS — M50123 Cervical disc disorder at C6-C7 level with radiculopathy: Secondary | ICD-10-CM | POA: Diagnosis not present

## 2021-06-25 DIAGNOSIS — M9902 Segmental and somatic dysfunction of thoracic region: Secondary | ICD-10-CM | POA: Diagnosis not present

## 2021-06-26 DIAGNOSIS — M50123 Cervical disc disorder at C6-C7 level with radiculopathy: Secondary | ICD-10-CM | POA: Diagnosis not present

## 2021-06-26 DIAGNOSIS — M9901 Segmental and somatic dysfunction of cervical region: Secondary | ICD-10-CM | POA: Diagnosis not present

## 2021-06-26 DIAGNOSIS — M5384 Other specified dorsopathies, thoracic region: Secondary | ICD-10-CM | POA: Diagnosis not present

## 2021-06-26 DIAGNOSIS — M9902 Segmental and somatic dysfunction of thoracic region: Secondary | ICD-10-CM | POA: Diagnosis not present

## 2021-06-30 ENCOUNTER — Other Ambulatory Visit: Payer: Self-pay | Admitting: Hematology

## 2021-07-07 DIAGNOSIS — M9901 Segmental and somatic dysfunction of cervical region: Secondary | ICD-10-CM | POA: Diagnosis not present

## 2021-07-07 DIAGNOSIS — M9902 Segmental and somatic dysfunction of thoracic region: Secondary | ICD-10-CM | POA: Diagnosis not present

## 2021-07-07 DIAGNOSIS — M50123 Cervical disc disorder at C6-C7 level with radiculopathy: Secondary | ICD-10-CM | POA: Diagnosis not present

## 2021-07-07 DIAGNOSIS — M5384 Other specified dorsopathies, thoracic region: Secondary | ICD-10-CM | POA: Diagnosis not present

## 2021-07-09 DIAGNOSIS — F4320 Adjustment disorder, unspecified: Secondary | ICD-10-CM | POA: Diagnosis not present

## 2021-07-10 DIAGNOSIS — M50123 Cervical disc disorder at C6-C7 level with radiculopathy: Secondary | ICD-10-CM | POA: Diagnosis not present

## 2021-07-10 DIAGNOSIS — M9902 Segmental and somatic dysfunction of thoracic region: Secondary | ICD-10-CM | POA: Diagnosis not present

## 2021-07-10 DIAGNOSIS — M9901 Segmental and somatic dysfunction of cervical region: Secondary | ICD-10-CM | POA: Diagnosis not present

## 2021-07-10 DIAGNOSIS — M5384 Other specified dorsopathies, thoracic region: Secondary | ICD-10-CM | POA: Diagnosis not present

## 2021-07-11 DIAGNOSIS — H53143 Visual discomfort, bilateral: Secondary | ICD-10-CM | POA: Diagnosis not present

## 2021-07-11 DIAGNOSIS — F4320 Adjustment disorder, unspecified: Secondary | ICD-10-CM | POA: Diagnosis not present

## 2021-07-14 DIAGNOSIS — M50123 Cervical disc disorder at C6-C7 level with radiculopathy: Secondary | ICD-10-CM | POA: Diagnosis not present

## 2021-07-14 DIAGNOSIS — M9901 Segmental and somatic dysfunction of cervical region: Secondary | ICD-10-CM | POA: Diagnosis not present

## 2021-07-14 DIAGNOSIS — M9902 Segmental and somatic dysfunction of thoracic region: Secondary | ICD-10-CM | POA: Diagnosis not present

## 2021-07-14 DIAGNOSIS — M5384 Other specified dorsopathies, thoracic region: Secondary | ICD-10-CM | POA: Diagnosis not present

## 2021-07-17 DIAGNOSIS — M5384 Other specified dorsopathies, thoracic region: Secondary | ICD-10-CM | POA: Diagnosis not present

## 2021-07-17 DIAGNOSIS — M9902 Segmental and somatic dysfunction of thoracic region: Secondary | ICD-10-CM | POA: Diagnosis not present

## 2021-07-17 DIAGNOSIS — M9901 Segmental and somatic dysfunction of cervical region: Secondary | ICD-10-CM | POA: Diagnosis not present

## 2021-07-17 DIAGNOSIS — M50123 Cervical disc disorder at C6-C7 level with radiculopathy: Secondary | ICD-10-CM | POA: Diagnosis not present

## 2021-07-22 ENCOUNTER — Telehealth (HOSPITAL_BASED_OUTPATIENT_CLINIC_OR_DEPARTMENT_OTHER): Payer: Self-pay

## 2021-07-22 NOTE — Telephone Encounter (Signed)
Patient called and wanted you to get the ball to rolling for her to have procedure "Tiffany Nash". She said it is ok for [place] to call her and leave a message about what the next step for her is. Please advise or make the referral. tbw

## 2021-07-28 DIAGNOSIS — M50123 Cervical disc disorder at C6-C7 level with radiculopathy: Secondary | ICD-10-CM | POA: Diagnosis not present

## 2021-07-28 DIAGNOSIS — M5384 Other specified dorsopathies, thoracic region: Secondary | ICD-10-CM | POA: Diagnosis not present

## 2021-07-28 DIAGNOSIS — M9901 Segmental and somatic dysfunction of cervical region: Secondary | ICD-10-CM | POA: Diagnosis not present

## 2021-07-28 DIAGNOSIS — M9902 Segmental and somatic dysfunction of thoracic region: Secondary | ICD-10-CM | POA: Diagnosis not present

## 2021-07-30 DIAGNOSIS — F4320 Adjustment disorder, unspecified: Secondary | ICD-10-CM | POA: Diagnosis not present

## 2021-07-30 NOTE — Telephone Encounter (Signed)
Informed pt that [facility] does not require a referral for the [facility] procedure. Advised that it is not covered by insurance and out of pocket cost is [phone] which is due at time of first visit. Advised that she would need to go online and fill out new patient forms. Advised that after getting those submitted, their office would call her and arrange an appt. Pt verbalized understanding.

## 2021-08-19 DIAGNOSIS — L821 Other seborrheic keratosis: Secondary | ICD-10-CM | POA: Diagnosis not present

## 2021-08-19 DIAGNOSIS — L820 Inflamed seborrheic keratosis: Secondary | ICD-10-CM | POA: Diagnosis not present

## 2021-08-20 DIAGNOSIS — F4320 Adjustment disorder, unspecified: Secondary | ICD-10-CM | POA: Diagnosis not present

## 2021-09-01 DIAGNOSIS — F4320 Adjustment disorder, unspecified: Secondary | ICD-10-CM | POA: Diagnosis not present

## 2021-09-10 DIAGNOSIS — F4320 Adjustment disorder, unspecified: Secondary | ICD-10-CM | POA: Diagnosis not present

## 2021-09-24 DIAGNOSIS — F4320 Adjustment disorder, unspecified: Secondary | ICD-10-CM | POA: Diagnosis not present

## 2021-09-25 ENCOUNTER — Other Ambulatory Visit: Payer: Self-pay

## 2021-09-25 MED ORDER — SERTRALINE HCL 25 MG PO TABS: 25.0000 mg | ORAL_TABLET | Freq: Every day | ORAL | 0 refills | Status: DC

## 2021-09-25 NOTE — Progress Notes (Signed)
Verbal order for Zoloft 25mg  given by Dr. Burr Medico for depression & anxiety.  E-Script sent to pt's preferred pharmacy.

## 2021-10-01 ENCOUNTER — Inpatient Hospital Stay: Payer: BC Managed Care – PPO | Attending: Hematology | Admitting: Hematology

## 2021-10-01 DIAGNOSIS — E2839 Other primary ovarian failure: Secondary | ICD-10-CM | POA: Diagnosis not present

## 2021-10-01 MED ORDER — TRAZODONE HCL 100 MG PO TABS: 50.0000 mg | ORAL_TABLET | Freq: Every day | ORAL | 0 refills | Status: DC

## 2021-10-01 NOTE — Progress Notes (Signed)
Tiffany Nash   Telephone:([phone] Fax:([phone]) [phone]   Clinic Follow up Note   Patient Care Team: Lujean Amel, MD as PCP - General (Family Medicine) Excell Seltzer, MD (Inactive) as Consulting Physician (General Surgery) Truitt Merle, MD as Consulting Physician (Hematology) Kyung Rudd, MD as Consulting Physician (Radiation Oncology)  Date of Service:  10/01/2021  I connected with Tiffany Nash on 10/01/2021 at  3:30 PM EST by telephone visit and verified that I am speaking with the correct person using two identifiers.  I discussed the limitations, risks, security and privacy concerns of performing an evaluation and management service by telephone and the availability of in person appointments. I also discussed with the patient that there may be a patient responsible charge related to this service. The patient expressed understanding and agreed to proceed.   Other persons participating in the visit and their role in the encounter:  none  Patient's location:  home Provider's location:  my office  CHIEF COMPLAINT: f/u to discuss insomnia, breast cancer  CURRENT THERAPY:  Letrozole 2.5 mg daily started in 07/2016. Switched to Tamoxifen from 09/2018-12/2018 due to osteoporosis, but due to poor tolerance we switched her to Letrozole again in 12/2018.   ASSESSMENT & PLAN:  Tiffany Nash is a 60 y.o. female with   1. Insomnia, Stress -likely stress-related -She has tried over-the-counter sleep aid, not very effective. -She previously tried [place], did not tolerate well. -she was started on Zoloft on 09/25/21 due to recent stressors and depression  -she continues to have issues sleeping. We discussed the Zoloft could take 6 weeks to start working. She is aware and wonders if she can take something until the Zoloft starts working. -my recommendation is to start with low-dose trazodone before moving to Xanax. I discussed that Xanax is a benzodiazepine and can be  habit-forming, which is why we recommend trazodone first. She is agreeable and will contact us with any issues or questions.  2. Breast cancer of lower-outer quadrant of left female breast, invasive and in situ ductal carcinoma, grade 1, pT1cN0M0, stage IA, ER 100% positive, PR 70% positive, HER-2 negative, RS 17 -She was diagnosed in 03/2016. She is s/p left breast lumpectomy and adjuvant radiation.  -She has low risk based on the recurrence score, which predicts 10 year distant recurrence after 5 years of tamoxifen 11%. She does not need adjuvant chemotherapy -She started anti-estrogen therapy with letrozole in 07/2016. Due to Osteoporosis I switched her to Tamoxifen in 09/2018 but did not tolerate well due to insomnia and vaginal discharge. I switched her back to Letrozole in 12/2018.  -last mammogram 09/2020 was benign; she notes she is scheduled for repeat next week. -She is tolerating letrozole well, will continue.  Plan to repeat a bone density scan in January 2023, if she has worsening osteoporosis, will stop then    3. Osteoporosis -Her 08/2018 DEXA from [facility] shows osteoporosis with Lowest T-score of -2.8 at [facility].  -Her 10/2019 DEXA showed improvement with lowest T-score -2.4 at [facility].  -She tried Zometa on 01/02/19 but stopped after severe flu like reaction which lasted about 2 weeks. -continue weight bearing exercises and daily calcium and vitamin D, she is doing well on that -Repeated next bone density scan in January 2023     Plan -I prescribed trazodone for her to try today, she will try 98m at bed time  -Continue Letrozole  -Lab and F/u in 3 months  -proceed with mammogram next week  at [facility] -I ordered DEXA to be done next month   No problem-specific Assessment & Plan notes found for this encounter.   SUMMARY OF ONCOLOGIC HISTORY: Oncology History Overview Note  Breast cancer of upper-outer quadrant of left female breast ([place])   Staging form: Breast, AJCC 7th  Edition   - Clinical stage from 04/15/2016: Stage IA (T1b, N0, M0) - Unsigned         Staging comments: Staged at breast conference on 7.5.17   - Pathologic stage from 04/22/2016: Stage IA (T1c, N0, cM0) - Unsigned      Breast cancer of upper-outer quadrant of left female breast ([place])  04/02/2016 Mammogram   1 cm oval massin the upper outer quadrant of left breast, suspicious for malignancy.    04/08/2016 Initial Diagnosis   Breast cancer of upper-outer quadrant of left female breast ([place])   04/08/2016 Initial Biopsy   Left breast core needle biopsy showed invasive ductal carcinoma and DCIS, grade 1-2.   04/08/2016 Receptors her2   Your 100% positive, strong staining, PR 70% positive, strong staining, HER-2 negative, Ki-67 15%   04/22/2016 Surgery   Left breast lumpectomy and SLN biopsy ([facility])   04/22/2016 Pathology Results   Left breast lumpectomy showed invasive ductal carcinoma, grade 1, 1.2 cm, low-grade DCIS, surgical margins were negative, 3 sentinel lymph nodes negative.   04/22/2016 Oncotype testing   RS 17, low risk, predicts 10 year distant recurrence risk of 11% with tamoxifen   05/26/2016 - 07/13/2016 Radiation Therapy   Adjuvant breast radiation [facility]): Left Breast/ 50.4 Gy in 28 fx.  Boost/ 10 Gy in 5 fx   07/2016 -  Anti-estrogen oral therapy   Letrozole 2.5 mg daily. Planned duration of therapy: 5-10 years.  Switched to Tamoxifen from 09/2018-12/2018 due to osteporosis, but due to poor tolerance we switched her to Letrozole again in 12/2018.    08/27/2016 Imaging   DEXA scan: Osteopenia. (T-score -2.4).    11/22/2017 Pathology Results   Diagnosis 11/22/17 Breast, left, needle core biopsy - DENSE STROMAL FIBROSIS, CONSISTENT WITH PRIOR SURGICAL PROCEDURE. - THERE IS NO EVIDENCE OF MALIGNANCY. - SEE COMMENT.   05/24/2020 Genetic Testing   Negative genetic testing:  No pathogenic variants detected on the [facility] Common Hereditary Cancers Panel. The report date is  05/24/2020.   The Common Hereditary Cancers Panel offered by [facility] includes sequencing and/or deletion duplication testing of the following 48 genes: APC, ATM, AXIN2, BARD1, BMPR1A, BRCA1, BRCA2, BRIP1, CDH1, CDK4, CDKN2A (p14ARF), CDKN2A (p16INK4a), CHEK2, CTNNA1, DICER1, EPCAM (Deletion/duplication testing only), GREM1 (promoter region deletion/duplication testing only), KIT, MEN1, MLH1, MSH2, MSH3, MSH6, MUTYH, NBN, NF1, NTHL1, PALB2, PDGFRA, PMS2, POLD1, POLE, PTEN, RAD50, RAD51C, RAD51D, RNF43, SDHB, SDHC, SDHD, SMAD4, SMARCA4. STK11, TP53, TSC1, TSC2, and VHL.  The following genes were evaluated for sequence changes only: SDHA and HOXB13 c.251G>A variant only.      INTERVAL HISTORY:  Tiffany Nash was contacted to discuss her insomnia. She was last seen by me on 01/02/21. She reports she only sleeps well 3-4 nights a week. She feels it is related to stress. She denies night sweats up until last night, but she adds she started the Zoloft last week.    All other systems were reviewed with the patient and are negative.  MEDICAL HISTORY:  Past Medical History:  Diagnosis Date   Abnormal Pap smear    Breast cancer ([place])    Elevated hemoglobin A1c June 2017   5.9   Family history of  pancreatic cancer    GERD (gastroesophageal reflux disease)    Migraine    uses imitrex as needed   Osteopenia    Osteoporosis    Schatzki's ring    and hiatal hernia on EGD   Shingles rash 07/2015    shingles on right rib cage    TMJ (temporomandibular joint syndrome)    Vertigo     SURGICAL HISTORY: Past Surgical History:  Procedure Laterality Date   BREAST BIOPSY  1/12   fibrocystic changes    BREAST LUMPECTOMY WITH RADIOACTIVE SEED AND SENTINEL LYMPH NODE BIOPSY Left 04/22/2016   Procedure: BREAST LUMPECTOMY WITH RADIOACTIVE SEED AND SENTINEL LYMPH NODE BIOPSY;  Surgeon: Excell Seltzer, MD;  Location: [place];  Service: General;  Laterality: Left;   [place] EXTRACTION      I have reviewed the social history and family history with the patient and they are unchanged from previous note.  ALLERGIES:  is allergic to oxycodone-acetaminophen, tylox [oxycodone-acetaminophen], zoledronic acid, and penicillins.  MEDICATIONS:  Current Outpatient Medications  Medication Sig Dispense Refill   traZODone (DESYREL) 100 MG tablet Take 0.5 tablets (50 mg total) by mouth at bedtime. 20 tablet 0   CALCIUM PO Take by mouth daily.     Cholecalciferol (VITAMIN D PO) Take 2,000 Units by mouth every other day.      ciprofloxacin (CIPRO) 500 MG tablet Take 1 tablet (500 mg total) by mouth 2 (two) times daily. 14 tablet 0   COLLAGEN PO Take by mouth.     letrozole (FEMARA) 2.5 MG tablet TAKE 1 TABLET(2.5 MG) BY MOUTH DAILY 30 tablet 5   Magnesium 100 MG CAPS Take 2 capsules by mouth daily.     metroNIDAZOLE (FLAGYL) 500 MG tablet Take 1 tablet (500 mg total) by mouth 2 (two) times daily. 14 tablet 0   sertraline (ZOLOFT) 25 MG tablet Take 1 tablet (25 mg total) by mouth daily. 30 tablet 0   SUMAtriptan (IMITREX) 100 MG tablet May repeat in 2 hours if headache persists or recurs. 9 tablet 6   No current facility-administered medications for this visit.    PHYSICAL EXAMINATION: ECOG PERFORMANCE STATUS: 0 - Asymptomatic  There were no vitals filed for this visit. Wt Readings from Last 3 Encounters:  03/12/21 137 lb (62.1 kg)  01/02/21 146 lb 9.6 oz (66.5 kg)  01/01/21 144 lb 3.2 oz (65.4 kg)     No vitals taken today, Exam not performed today  LABORATORY DATA:  I have reviewed the data as listed CBC Latest Ref Rng & Units 03/12/2021 01/02/2021 07/02/2020  WBC 4.0 - 10.5 K/uL 12.2(H) 5.2 6.3  Hemoglobin 12.0 - 15.0 g/dL 12.8 12.7 12.2  Hematocrit 36.0 - 46.0 % 38.9 37.4 37.8  Platelets 150 - 400 K/uL 260 306 278     CMP  Latest Ref Rng & Units 03/12/2021 01/02/2021 07/02/2020  Glucose 70 - 99 mg/dL 123(H) 119(H) 107(H)  BUN 8 - 23 mg/dL _0 Creatinine 0.44 - 1.00 mg/dL 0.70 0.79 0.83  Sodium 135 - 145 mmol/L 135 138 138  Potassium 3.5 - 5.1 mmol/L 4.0 4.3 4.7  Chloride 98 - 111 mmol/L 100 103 104  CO2 22 - 32 mmol/L _1 Calcium 8.9 - 10.3 mg/dL 9.5 9.5 9.8  Total Protein 6.5 - 8.1  g/dL 6.8 7.4 7.4  Total Bilirubin 0.3 - 1.2 mg/dL 0.5 0.5 0.5  Alkaline Phos 38 - 126 U/L 108 57 61  AST 15 - 41 U/L 97(H) 22 21  ALT 0 - 44 U/L 100(H) 21 21      RADIOGRAPHIC STUDIES: I have personally reviewed the radiological images as listed and agreed with the findings in the report. No results found.    Orders Placed This Encounter  Procedures   DG Bone Density    Standing Status:   Future    Standing Expiration Date:   10/01/2022    Scheduling Instructions:     [place]    Order Specific Question:   Reason for Exam (SYMPTOM  OR DIAGNOSIS REQUIRED)    Answer:   screening    Order Specific Question:   Preferred imaging location?    Answer:   External   All questions were answered. The patient knows to call the clinic with any problems, questions or concerns. No barriers to learning was detected. The total time spent in the appointment was 22 minutes.     Truitt Merle, MD 10/01/2021   I, Wilburn Mylar, am acting as scribe for Truitt Merle, MD.   I have reviewed the above documentation for accuracy and completeness, and I agree with the above.

## 2021-10-02 ENCOUNTER — Telehealth: Payer: Self-pay | Admitting: Hematology

## 2021-10-02 NOTE — Telephone Encounter (Signed)
Scheduled per 12/20 los, message has been left with the pt

## 2021-10-07 DIAGNOSIS — R922 Inconclusive mammogram: Secondary | ICD-10-CM | POA: Diagnosis not present

## 2021-10-07 DIAGNOSIS — Z853 Personal history of malignant neoplasm of breast: Secondary | ICD-10-CM | POA: Diagnosis not present

## 2021-10-09 ENCOUNTER — Encounter (HOSPITAL_BASED_OUTPATIENT_CLINIC_OR_DEPARTMENT_OTHER): Payer: Self-pay | Admitting: *Deleted

## 2021-10-15 DIAGNOSIS — F4320 Adjustment disorder, unspecified: Secondary | ICD-10-CM | POA: Diagnosis not present

## 2021-10-20 DIAGNOSIS — H2513 Age-related nuclear cataract, bilateral: Secondary | ICD-10-CM | POA: Diagnosis not present

## 2021-10-20 DIAGNOSIS — H5213 Myopia, bilateral: Secondary | ICD-10-CM | POA: Diagnosis not present

## 2021-10-21 ENCOUNTER — Other Ambulatory Visit: Payer: Self-pay

## 2021-10-21 MED ORDER — TRAZODONE HCL 100 MG PO TABS: 50.0000 mg | ORAL_TABLET | Freq: Every day | ORAL | 1 refills | Status: DC

## 2021-10-21 MED ORDER — SERTRALINE HCL 50 MG PO TABS: 50.0000 mg | ORAL_TABLET | Freq: Every day | ORAL | 2 refills | Status: DC

## 2021-10-21 NOTE — Progress Notes (Signed)
Phoned in refill prescription to [facility] (spoke with Cristie Hem pharmacist) for Trazodone with 1 refill per Dr. Burr Medico.  New prescription given for Sertraline (Zoloft) 50mg  PO daily for 30day supply w/2 refills per Dr. Ernestina Penna verbal order.  Alex confirmed prescription and stated prescription will be filled today and pt will be notified when prescription is ready for pickup.

## 2021-10-23 ENCOUNTER — Telehealth: Payer: Self-pay

## 2021-10-23 ENCOUNTER — Other Ambulatory Visit: Payer: Self-pay | Admitting: Hematology

## 2021-10-23 DIAGNOSIS — F4322 Adjustment disorder with anxiety: Secondary | ICD-10-CM | POA: Diagnosis not present

## 2021-10-23 MED ORDER — TRAZODONE HCL 100 MG PO TABS: 100.0000 mg | ORAL_TABLET | Freq: Every day | ORAL | 1 refills | Status: DC

## 2021-10-23 NOTE — Telephone Encounter (Signed)
Pt called explaining she has been taking the prescription incorrectly for her trazodone, instead of taking half she has been taking the whole 100mg . Pt states she has felt fine and it does not bother her. She isn't able to get a refill currently due to that. Spoke with Dr. Burr Medico and she has put in a new order for the pt to take 100mg  of Trazodone at bedtime. Dr. Burr Medico states pt should be able to get a refill now. Pt aware.

## 2021-11-03 DIAGNOSIS — F4320 Adjustment disorder, unspecified: Secondary | ICD-10-CM | POA: Diagnosis not present

## 2021-11-03 DIAGNOSIS — F411 Generalized anxiety disorder: Secondary | ICD-10-CM | POA: Diagnosis not present

## 2021-11-17 DIAGNOSIS — F411 Generalized anxiety disorder: Secondary | ICD-10-CM | POA: Diagnosis not present

## 2021-12-04 DIAGNOSIS — F4320 Adjustment disorder, unspecified: Secondary | ICD-10-CM | POA: Diagnosis not present

## 2021-12-08 DIAGNOSIS — F411 Generalized anxiety disorder: Secondary | ICD-10-CM | POA: Diagnosis not present

## 2021-12-19 ENCOUNTER — Other Ambulatory Visit: Payer: Self-pay | Admitting: Hematology

## 2021-12-19 ENCOUNTER — Other Ambulatory Visit: Payer: Self-pay

## 2021-12-19 DIAGNOSIS — K5792 Diverticulitis of intestine, part unspecified, without perforation or abscess without bleeding: Secondary | ICD-10-CM | POA: Insufficient documentation

## 2021-12-19 DIAGNOSIS — K2930 Chronic superficial gastritis without bleeding: Secondary | ICD-10-CM | POA: Insufficient documentation

## 2021-12-19 DIAGNOSIS — R198 Other specified symptoms and signs involving the digestive system and abdomen: Secondary | ICD-10-CM | POA: Insufficient documentation

## 2021-12-19 DIAGNOSIS — R7401 Elevation of levels of liver transaminase levels: Secondary | ICD-10-CM | POA: Insufficient documentation

## 2021-12-19 DIAGNOSIS — R1030 Lower abdominal pain, unspecified: Secondary | ICD-10-CM | POA: Insufficient documentation

## 2021-12-19 DIAGNOSIS — K222 Esophageal obstruction: Secondary | ICD-10-CM | POA: Insufficient documentation

## 2021-12-19 DIAGNOSIS — K219 Gastro-esophageal reflux disease without esophagitis: Secondary | ICD-10-CM | POA: Insufficient documentation

## 2021-12-19 NOTE — Telephone Encounter (Signed)
Per last office note, continue letrozole.Gardiner Rhyme, RN ?

## 2021-12-24 DIAGNOSIS — F411 Generalized anxiety disorder: Secondary | ICD-10-CM | POA: Diagnosis not present

## 2021-12-25 DIAGNOSIS — F4320 Adjustment disorder, unspecified: Secondary | ICD-10-CM | POA: Diagnosis not present

## 2021-12-30 ENCOUNTER — Other Ambulatory Visit: Payer: Self-pay

## 2021-12-30 DIAGNOSIS — C50412 Malignant neoplasm of upper-outer quadrant of left female breast: Secondary | ICD-10-CM

## 2021-12-30 DIAGNOSIS — E2839 Other primary ovarian failure: Secondary | ICD-10-CM

## 2021-12-31 ENCOUNTER — Encounter: Payer: Self-pay | Admitting: Hematology

## 2021-12-31 ENCOUNTER — Inpatient Hospital Stay (HOSPITAL_BASED_OUTPATIENT_CLINIC_OR_DEPARTMENT_OTHER): Payer: BC Managed Care – PPO | Admitting: Hematology

## 2021-12-31 ENCOUNTER — Inpatient Hospital Stay: Payer: BC Managed Care – PPO | Attending: Hematology

## 2021-12-31 ENCOUNTER — Other Ambulatory Visit: Payer: Self-pay

## 2021-12-31 VITALS — BP 129/75 | HR 61 | Temp 98.6°F | Resp 18 | Ht 66.0 in | Wt 139.0 lb

## 2021-12-31 DIAGNOSIS — Z79899 Other long term (current) drug therapy: Secondary | ICD-10-CM | POA: Diagnosis not present

## 2021-12-31 DIAGNOSIS — Z79811 Long term (current) use of aromatase inhibitors: Secondary | ICD-10-CM | POA: Insufficient documentation

## 2021-12-31 DIAGNOSIS — G4700 Insomnia, unspecified: Secondary | ICD-10-CM | POA: Diagnosis not present

## 2021-12-31 DIAGNOSIS — M810 Age-related osteoporosis without current pathological fracture: Secondary | ICD-10-CM | POA: Insufficient documentation

## 2021-12-31 DIAGNOSIS — C50412 Malignant neoplasm of upper-outer quadrant of left female breast: Secondary | ICD-10-CM | POA: Insufficient documentation

## 2021-12-31 DIAGNOSIS — N898 Other specified noninflammatory disorders of vagina: Secondary | ICD-10-CM | POA: Diagnosis not present

## 2021-12-31 DIAGNOSIS — Z170 Estrogen receptor positive status [ER+]: Secondary | ICD-10-CM | POA: Insufficient documentation

## 2021-12-31 DIAGNOSIS — Z800 Family history of malignant neoplasm of digestive organs: Secondary | ICD-10-CM | POA: Insufficient documentation

## 2021-12-31 DIAGNOSIS — Z880 Allergy status to penicillin: Secondary | ICD-10-CM | POA: Insufficient documentation

## 2021-12-31 DIAGNOSIS — Z886 Allergy status to analgesic agent status: Secondary | ICD-10-CM | POA: Diagnosis not present

## 2021-12-31 DIAGNOSIS — Z9049 Acquired absence of other specified parts of digestive tract: Secondary | ICD-10-CM | POA: Insufficient documentation

## 2021-12-31 DIAGNOSIS — E2839 Other primary ovarian failure: Secondary | ICD-10-CM

## 2021-12-31 DIAGNOSIS — K219 Gastro-esophageal reflux disease without esophagitis: Secondary | ICD-10-CM | POA: Diagnosis not present

## 2021-12-31 LAB — CBC WITH DIFFERENTIAL (CANCER CENTER ONLY)
Abs Immature Granulocytes: 0 10*3/uL (ref 0.00–0.07)
Basophils Absolute: 0 10*3/uL (ref 0.0–0.1)
Basophils Relative: 1 %
Eosinophils Absolute: 0.2 10*3/uL (ref 0.0–0.5)
Eosinophils Relative: 3 %
HCT: 41.2 % (ref 36.0–46.0)
Hemoglobin: 13.5 g/dL (ref 12.0–15.0)
Immature Granulocytes: 0 %
Lymphocytes Relative: 37 %
Lymphs Abs: 1.9 10*3/uL (ref 0.7–4.0)
MCH: 31.4 pg (ref 26.0–34.0)
MCHC: 32.8 g/dL (ref 30.0–36.0)
MCV: 95.8 fL (ref 80.0–100.0)
Monocytes Absolute: 0.5 10*3/uL (ref 0.1–1.0)
Monocytes Relative: 9 %
Neutro Abs: 2.6 10*3/uL (ref 1.7–7.7)
Neutrophils Relative %: 50 %
Platelet Count: 289 10*3/uL (ref 150–400)
RBC: 4.3 MIL/uL (ref 3.87–5.11)
RDW: 12.3 % (ref 11.5–15.5)
WBC Count: 5.2 10*3/uL (ref 4.0–10.5)
nRBC: 0 % (ref 0.0–0.2)

## 2021-12-31 LAB — CMP (CANCER CENTER ONLY)
ALT: 20 U/L (ref 0–44)
AST: 19 U/L (ref 15–41)
Albumin: 4.9 g/dL (ref 3.5–5.0)
Alkaline Phosphatase: 62 U/L (ref 38–126)
Anion gap: 5 (ref 5–15)
BUN: 15 mg/dL (ref 8–23)
CO2: 30 mmol/L (ref 22–32)
Calcium: 10.1 mg/dL (ref 8.9–10.3)
Chloride: 102 mmol/L (ref 98–111)
Creatinine: 0.77 mg/dL (ref 0.44–1.00)
GFR, Estimated: >60 mL/min (ref >60)
Glucose, Bld: 120 mg/dL — ABNORMAL HIGH (ref 70–99)
Potassium: 4.4 mmol/L (ref 3.5–5.1)
Sodium: 137 mmol/L (ref 135–145)
Total Bilirubin: 0.5 mg/dL (ref 0.3–1.2)
Total Protein: 7.7 g/dL (ref 6.5–8.1)

## 2021-12-31 LAB — VITAMIN D 25 HYDROXY (VIT D DEFICIENCY, FRACTURES): Vit D, 25-Hydroxy: 52.26 ng/mL (ref 30–100)

## 2021-12-31 NOTE — Progress Notes (Signed)
?Tiffany Nash   ?Telephone:([phone] [phone]   ?Clinic Follow up Note  ? ?Patient Care Team: ?Lujean Amel, MD as PCP - General (Family Medicine) ?Excell Seltzer, MD (Inactive) as Consulting Physician (General Surgery) ?Truitt Merle, MD as Consulting Physician (Hematology) ?Kyung Rudd, MD as Consulting Physician (Radiation Oncology) ? ?Date of Service:  12/31/2021 ? ?CHIEF COMPLAINT: f/u of left breast cancer ? ?CURRENT THERAPY:  ?Letrozole 2.5 mg daily started in 07/2016. Switched to Tamoxifen from 09/2018-12/2018 due to osteoporosis, but due to poor tolerance we switched her to Letrozole again in 12/2018.  ? ?ASSESSMENT & PLAN:  ?Tiffany Nash is a 61 y.o. postmenopausal female with  ? ?1. Breast cancer of lower-outer quadrant of left female breast, invasive and in situ ductal carcinoma, grade 1, pT1cN0M0, stage IA, ER 100% positive, PR 70% positive, HER-2 negative, RS 17 ?-She was diagnosed in 03/2016. She is s/p left breast lumpectomy and adjuvant radiation.  ?-Oncotype RS of 17, indicating low risk. ?-She started anti-estrogen therapy with letrozole in 07/2016. Due to Osteoporosis I switched her to Tamoxifen in 09/2018 but did not tolerate well due to insomnia and vaginal discharge. I switched her back to Letrozole in 12/2018. Plan for a total of 7 years. ?-most recent mammogram on 10/07/21 was benign. ?-She is clinically doing very well and tolerating letrozole with no noticeable side effects. Labs reviewed, WNL, vit D is pending. Physical exam was unremarkable. There is no clinical concern for recurrence. She is also coming up on 5 years since diagnosis. ?  ?2. Bone Health ?-Her 08/2018 DEXA from [facility] shows osteoporosis with Lowest T-score of -2.8 at [facility]. Repeat DEXA in 10/2019 showed improvement with lowest T-score -2.4 at [facility].  ?-She tried Zometa on 01/02/19 but stopped after severe flu like reaction which lasted about 2 weeks. ?-continue weight bearing exercises and  daily calcium and vitamin D ?-Repeat next bone density scan ito be done this year. ? ?3. Insomnia, Stress ?-She has tried OTC sleep aid, not very effective, and Lunesta, did not tolerate. ?-she was started on Zoloft on 09/25/21 for stress and depression  ?-I started her on trazodone on 10/01/21; she is taking 100 mg daily. She is trying to cut down and wean off  ?  ?  ?Plan ?-Continue Letrozole  ?-Lab and F/u with NP Lacie in 6 months  ?-order is in place for DEXA to be done any time ? ? ?No problem-specific Assessment & Plan notes found for this encounter. ? ? ?SUMMARY OF ONCOLOGIC HISTORY: ?Oncology History Overview Note  ?Breast cancer of upper-outer quadrant of left female breast ([place]) ?  Staging form: Breast, AJCC 7th Edition ?  - Clinical stage from 04/15/2016: Stage IA (T1b, N0, M0) - Unsigned ?        Staging comments: Staged at breast conference on 7.5.17 ?  - Pathologic stage from 04/22/2016: Stage IA (T1c, N0, cM0) - Unsigned ? ? ? ?  ?Breast cancer of upper-outer quadrant of left female breast ([place])  ?04/02/2016 Mammogram  ? 1 cm oval massin the upper outer quadrant of left breast, suspicious for malignancy.  ?  ?04/08/2016 Initial Diagnosis  ? Breast cancer of upper-outer quadrant of left female breast [facility]) ?  ?04/08/2016 Initial Biopsy  ? Left breast core needle biopsy showed invasive ductal carcinoma and DCIS, grade 1-2. ?  ?04/08/2016 Receptors her2  ? Your 100% positive, strong staining, PR 70% positive, strong staining, HER-2 negative, Ki-67 15% ?  ?04/22/2016 Surgery  ?  Left breast lumpectomy and SLN biopsy ([facility]) ?  ?04/22/2016 Pathology Results  ? Left breast lumpectomy showed invasive ductal carcinoma, grade 1, 1.2 cm, low-grade DCIS, surgical margins were negative, 3 sentinel lymph nodes negative. ?  ?04/22/2016 Oncotype testing  ? RS 17, low risk, predicts 10 year distant recurrence risk of 11% with tamoxifen ?  ?05/26/2016 - 07/13/2016 Radiation Therapy  ? Adjuvant breast radiation [facility]): Left  Breast/ 50.4 Gy in 28 fx.  Boost/ 10 Gy in 5 fx ?  ?07/2016 -  Anti-estrogen oral therapy  ? Letrozole 2.5 mg daily. Planned duration of therapy: 5-10 years.  ?Switched to Tamoxifen from 09/2018-12/2018 due to osteporosis, but due to poor tolerance we switched her to Letrozole again in 12/2018.  ?  ?08/27/2016 Imaging  ? DEXA scan: Osteopenia. (T-score -2.4).  ?  ?11/22/2017 Pathology Results  ? Diagnosis 11/22/17 ?Breast, left, needle core biopsy ?- DENSE STROMAL FIBROSIS, CONSISTENT WITH PRIOR SURGICAL PROCEDURE. ?- THERE IS NO EVIDENCE OF MALIGNANCY. ?- SEE COMMENT. ?  ?05/24/2020 Genetic Testing  ? Negative genetic testing:  No pathogenic variants detected on the [facility] Common Hereditary Cancers Panel. The report date is 05/24/2020.  ? ?The Common Hereditary Cancers Panel offered by [facility] includes sequencing and/or deletion duplication testing of the following 48 genes: APC, ATM, AXIN2, BARD1, BMPR1A, BRCA1, BRCA2, BRIP1, CDH1, CDK4, CDKN2A (p14ARF), CDKN2A (p16INK4a), CHEK2, CTNNA1, DICER1, EPCAM (Deletion/duplication testing only), GREM1 (promoter region deletion/duplication testing only), KIT, MEN1, MLH1, MSH2, MSH3, MSH6, MUTYH, NBN, NF1, NTHL1, PALB2, PDGFRA, PMS2, POLD1, POLE, PTEN, RAD50, RAD51C, RAD51D, RNF43, SDHB, SDHC, SDHD, SMAD4, SMARCA4. STK11, TP53, TSC1, TSC2, and VHL.  The following genes were evaluated for sequence changes only: SDHA and HOXB13 c.251G>A variant only. ?  ? ? ? ?INTERVAL HISTORY:  ?Tiffany Nash is here for a follow up of breast cancer. She was last seen by me on 10/01/21. She presents to the clinic alone. ?She reports she has started noticing tingling in her hands and feet since starting the trazodone. She notes it's intermittent, occurs for about 10 min three times a day. She reports she is trying to wean off taking the trazadone daily. ?  ?All other systems were reviewed with the patient and are negative. ? ?MEDICAL HISTORY:  ?Past Medical History:  ?Diagnosis Date  ? Abnormal  Pap smear   ? Breast cancer ([place])   ? Elevated hemoglobin A1c June 2017  ? 5.9  ? Family history of pancreatic cancer   ? GERD (gastroesophageal reflux disease)   ? Migraine   ? uses imitrex as needed  ? Osteopenia   ? Osteoporosis   ? Schatzki's ring   ? and hiatal hernia on EGD  ? Shingles rash 07/2015   ? shingles on right rib cage   ? TMJ (temporomandibular joint syndrome)   ? Vertigo   ? ? ?SURGICAL HISTORY: ?Past Surgical History:  ?Procedure Laterality Date  ? BREAST BIOPSY  1/12  ? fibrocystic changes   ? BREAST LUMPECTOMY WITH RADIOACTIVE SEED AND SENTINEL LYMPH NODE BIOPSY Left 04/22/2016  ? Procedure: BREAST LUMPECTOMY WITH RADIOACTIVE SEED AND SENTINEL LYMPH NODE BIOPSY;  Surgeon: Excell Seltzer, MD;  Location: [place];  Service: General;  Laterality: Left;  ? DILATION AND CURETTAGE OF UTERUS  1991  ? ESOPHAGEAL DILATION    ? GYNECOLOGIC CRYOSURGERY  1980s  ? LAPAROSCOPIC CHOLECYSTECTOMY  1992  ? WISDOM TOOTH EXTRACTION    ? ? ?I have reviewed the social history and family history with  the patient and they are unchanged from previous note. ? ?ALLERGIES:  is allergic to ibuprofen, oxycodone-acetaminophen, tylox [oxycodone-acetaminophen], tyloxapol, zoledronic acid, and penicillins. ? ?MEDICATIONS:  ?Current Outpatient Medications  ?Medication Sig Dispense Refill  ? Calcium Citrate-Vitamin D (CALCIUM CITRATE + PO) Take by mouth.    ? Cholecalciferol (VITAMIN D PO) Take 2,000 Units by mouth every other day.     ? letrozole (FEMARA) 2.5 MG tablet TAKE 1 TABLET(2.5 MG) BY MOUTH DAILY 30 tablet 5  ? Magnesium 100 MG CAPS Take 2 capsules by mouth daily.    ? sertraline (ZOLOFT) 50 MG tablet Take 1 tablet (50 mg total) by mouth daily. 30 tablet 2  ? SUMAtriptan (IMITREX) 100 MG tablet May repeat in 2 hours if headache persists or recurs. 9 tablet 6  ? traZODone (DESYREL) 100 MG tablet Take 1 tablet (100 mg total) by mouth at bedtime. 30 tablet 1  ? ?No current facility-administered  medications for this visit.  ? ? ?PHYSICAL EXAMINATION: ?ECOG PERFORMANCE STATUS: 0 - Asymptomatic ? ?Vitals:  ? 12/31/21 1133  ?BP: 129/75  ?Pulse: 61  ?Resp: 18  ?Temp: 98.6 ?F (37 ?C)  ?SpO2: 99%  ? ?Wt Read

## 2022-01-01 ENCOUNTER — Emergency Department (HOSPITAL_BASED_OUTPATIENT_CLINIC_OR_DEPARTMENT_OTHER)
Admission: EM | Admit: 2022-01-01 | Discharge: 2022-01-01 | Disposition: A | Discharge status: Home / Self Care | Payer: BC Managed Care – PPO | Attending: Emergency Medicine | Admitting: Emergency Medicine

## 2022-01-01 ENCOUNTER — Encounter (HOSPITAL_BASED_OUTPATIENT_CLINIC_OR_DEPARTMENT_OTHER): Payer: Self-pay | Admitting: Obstetrics and Gynecology

## 2022-01-01 ENCOUNTER — Other Ambulatory Visit: Payer: Self-pay

## 2022-01-01 ENCOUNTER — Emergency Department (HOSPITAL_BASED_OUTPATIENT_CLINIC_OR_DEPARTMENT_OTHER): Payer: BC Managed Care – PPO

## 2022-01-01 ENCOUNTER — Emergency Department (HOSPITAL_COMMUNITY): Payer: BC Managed Care – PPO

## 2022-01-01 DIAGNOSIS — R531 Weakness: Secondary | ICD-10-CM | POA: Diagnosis not present

## 2022-01-01 DIAGNOSIS — R200 Anesthesia of skin: Secondary | ICD-10-CM | POA: Diagnosis not present

## 2022-01-01 DIAGNOSIS — G459 Transient cerebral ischemic attack, unspecified: Secondary | ICD-10-CM | POA: Diagnosis not present

## 2022-01-01 DIAGNOSIS — I10 Essential (primary) hypertension: Secondary | ICD-10-CM | POA: Diagnosis not present

## 2022-01-01 DIAGNOSIS — I639 Cerebral infarction, unspecified: Secondary | ICD-10-CM | POA: Diagnosis not present

## 2022-01-01 DIAGNOSIS — M4312 Spondylolisthesis, cervical region: Secondary | ICD-10-CM | POA: Diagnosis not present

## 2022-01-01 DIAGNOSIS — R202 Paresthesia of skin: Secondary | ICD-10-CM

## 2022-01-01 DIAGNOSIS — R29818 Other symptoms and signs involving the nervous system: Secondary | ICD-10-CM | POA: Diagnosis not present

## 2022-01-01 LAB — URINALYSIS, ROUTINE W REFLEX MICROSCOPIC
Bilirubin Urine: NEGATIVE
Glucose, UA: NEGATIVE mg/dL
Hgb urine dipstick: NEGATIVE
Ketones, ur: NEGATIVE mg/dL
Leukocytes,Ua: NEGATIVE
Nitrite: NEGATIVE
Protein, ur: NEGATIVE mg/dL
Specific Gravity, Urine: 1.007 (ref 1.005–1.030)
pH: 6.5 (ref 5.0–8.0)

## 2022-01-01 LAB — CBC
HCT: 40.5 % (ref 36.0–46.0)
Hemoglobin: 13.2 g/dL (ref 12.0–15.0)
MCH: 31.1 pg (ref 26.0–34.0)
MCHC: 32.6 g/dL (ref 30.0–36.0)
MCV: 95.5 fL (ref 80.0–100.0)
Platelets: 282 10*3/uL (ref 150–400)
RBC: 4.24 MIL/uL (ref 3.87–5.11)
RDW: 12.6 % (ref 11.5–15.5)
WBC: 5.9 10*3/uL (ref 4.0–10.5)
nRBC: 0 % (ref 0.0–0.2)

## 2022-01-01 LAB — BASIC METABOLIC PANEL
Anion gap: 9 (ref 5–15)
BUN: 19 mg/dL (ref 8–23)
CO2: 30 mmol/L (ref 22–32)
Calcium: 10.3 mg/dL (ref 8.9–10.3)
Chloride: 100 mmol/L (ref 98–111)
Creatinine, Ser: 0.8 mg/dL (ref 0.44–1.00)
GFR, Estimated: >60 mL/min (ref >60)
Glucose, Bld: 93 mg/dL (ref 70–99)
Potassium: 3.8 mmol/L (ref 3.5–5.1)
Sodium: 139 mmol/L (ref 135–145)

## 2022-01-01 LAB — TROPONIN I (HIGH SENSITIVITY): Troponin I (High Sensitivity): <2 ng/L (ref <18)

## 2022-01-01 MED ORDER — GADOBUTROL 1 MMOL/ML IV SOLN
6.2000 mL | Freq: Once | INTRAVENOUS | Status: AC | PRN
  Administered 2022-01-01: 6.2 mL via INTRAVENOUS

## 2022-01-01 MED ORDER — ASPIRIN 81 MG PO CHEW: 81.0000 mg | CHEWABLE_TABLET | Freq: Every day | ORAL | 0 refills | Status: AC

## 2022-01-01 NOTE — Discharge Instructions (Addendum)
You came to the emergency department today to be evaluated for your numbness and weakness.  The MRIs showed no acute signs of stroke or demyelinating disease.  You will need to follow-up with neurology in outpatient setting.  Please begin taking 81 mg of aspirin once daily. ? ?Get help right away if you: ?Feel muscle weakness. ?Develop new weakness in an arm or leg. ?Have trouble walking or moving. ?Have problems with speech, understanding, or vision. ?Feel confused. ?Cannot control your bladder or bowel movements. ?

## 2022-01-01 NOTE — ED Notes (Signed)
Updated Pt on wait for MRI. ?

## 2022-01-01 NOTE — ED Notes (Signed)
Patient in MRI 

## 2022-01-01 NOTE — ED Notes (Signed)
Second Troponin not needed. ?

## 2022-01-01 NOTE — ED Provider Notes (Signed)
?[facility] ?Provider Note ? ? ?CSN: [id number] ?Arrival date & time: 01/01/22  1227 ? ?  ? ?History ? ?Chief Complaint  ?Patient presents with  ? Tingling  ? ? ?Tiffany Nash is a 61 y.o. female with a past medical history of migraine, osteoporosis, osteopenia, shingles, GERD.  Presents emergency department complaining of numbness and weakness.  Patient reports that intermittently over the last week she has been having numbness and throughout her body.  Patient reports that symptoms have been present daily ranging from 1-3 times a day.  Symptoms have been isolated to the left side of her body, right side of her body, and bilateral leg.  Patient reports that symptoms are generally throughout the entire affected side however most noticeable in the hands and feet.  Symptoms generally last 10 to 30 minutes before spontaneously resolving.  No aggravating factors.  Patient states that today she developed numbness and weakness to the entire left side of her body.  Symptoms were most noticeable on her left all arm.  Symptoms have been present since starting. ? ?Denies any facial asymmetry, dysarthria, visual disturbance, ataxia, chest pain, shortness of breath, traumatic injury. ? ?Denies any illicit drug use.  Reports drinking approximately 5 alcoholic beverages per week. ? ?HPI ? ?  ? ?Home Medications ?Prior to Admission medications   ?Medication Sig Start Date End Date Taking? Authorizing Provider  ?Calcium Citrate-Vitamin D (CALCIUM CITRATE + PO) Take by mouth.    [provider]  ?Cholecalciferol (VITAMIN D PO) Take 2,000 Units by mouth every other day.     [provider]  ?letrozole (FEMARA) 2.5 MG tablet TAKE 1 TABLET(2.5 MG) BY MOUTH DAILY 12/19/21   Truitt Merle, MD  ?Magnesium 100 MG CAPS Take 2 capsules by mouth daily.    [provider]  ?sertraline (ZOLOFT) 50 MG tablet Take 1 tablet (50 mg total) by mouth daily. 10/21/21   Truitt Merle, MD  ?SUMAtriptan  (IMITREX) 100 MG tablet May repeat in 2 hours if headache persists or recurs. 01/01/21   Megan Salon, MD  ?traZODone (DESYREL) 100 MG tablet Take 1 tablet (100 mg total) by mouth at bedtime. 10/23/21   Truitt Merle, MD  ?   ? ?Allergies    ?Ibuprofen, Oxycodone-acetaminophen, Tylox [oxycodone-acetaminophen], Tyloxapol, Zoledronic acid, and Penicillins   ? ?Review of Systems   ?Review of Systems  ?Constitutional:  Negative for chills and fever.  ?Eyes:  Negative for visual disturbance.  ?Respiratory:  Negative for shortness of breath.   ?Cardiovascular:  Negative for chest pain.  ?Gastrointestinal:  Negative for abdominal pain, nausea and vomiting.  ?Genitourinary:  Negative for difficulty urinating, dysuria, frequency, hematuria and urgency.  ?Musculoskeletal:  Negative for back pain and neck pain.  ?Skin:  Negative for color change and rash.  ?Neurological:  Positive for weakness and numbness. Negative for dizziness, tremors, seizures, syncope, facial asymmetry, speech difficulty, light-headedness and headaches.  ?Psychiatric/Behavioral:  Negative for confusion.   ? ?Physical Exam ?Updated Vital Signs ?BP 132/66 (BP Location: Right Arm)   Pulse 71   Temp 98.2 ?F (36.8 ?C)   Resp 16   Ht 5' 5.5" (1.664 m)   Wt 62.6 kg   LMP 05/12/2012   SpO2 99%   BMI 22.62 kg/m?  ?Physical Exam ?Vitals and nursing note reviewed.  ?Constitutional:   ?   General: She is not in acute distress. ?   Appearance: She is not ill-appearing, toxic-appearing or diaphoretic.  ?Eyes:  ?  General: No visual field deficit.    ?   Right eye: No discharge.     ?   Left eye: No discharge.  ?   Extraocular Movements: Extraocular movements intact.  ?   Conjunctiva/sclera: Conjunctivae normal.  ?   Pupils: Pupils are equal, round, and reactive to light.  ?Cardiovascular:  ?   Rate and Rhythm: Normal rate.  ?Pulmonary:  ?   Effort: Pulmonary effort is normal.  ?Musculoskeletal:  ?   Cervical back: Normal range of motion and neck supple. No  rigidity.  ?Skin: ?   General: Skin is warm and dry.  ?Neurological:  ?   General: No focal deficit present.  ?   Mental Status: She is alert and oriented to person, place, and time.  ?   GCS: GCS eye subscore is 4. GCS verbal subscore is 5. GCS motor subscore is 6.  ?   Cranial Nerves: No cranial nerve deficit, dysarthria or facial asymmetry.  ?   Motor: No weakness, tremor, seizure activity or pronator drift.  ?   Coordination: Romberg sign negative. Finger-Nose-Finger Test normal.  ?   Gait: Gait is intact. Gait normal.  ?   Comments: CN II-XII intact, equal grip strength, +5 strength to bilateral upper and lower extremities.  Sensation to light touch is grossly intact to bilateral upper and lower extremities; does not report any decrease sensation to the left side of her body.  Able to stand and ambulate without difficulty.  ?Psychiatric:     ?   Behavior: Behavior is cooperative.  ? ? ?ED Results / Procedures / Treatments   ?Labs ?(all labs ordered are listed, but only abnormal results are displayed) ?Labs Reviewed  ?URINALYSIS, ROUTINE W REFLEX MICROSCOPIC - Abnormal; Notable for the following components:  ?    Result Value  ? Color, Urine COLORLESS (*)   ? All other components within normal limits  ?BASIC METABOLIC PANEL  ?CBC  ?TROPONIN I (HIGH SENSITIVITY)  ?TROPONIN I (HIGH SENSITIVITY)  ? ? ?EKG ?EKG Interpretation ? ?Date/Time:  Thursday January 01 2022 12:39:46 EDT ?Ventricular Rate:  71 ?PR Interval:  160 ?QRS Duration: 90 ?QT Interval:  372 ?QTC Calculation: 404 ?R Axis:   23 ?Text Interpretation: Normal sinus rhythm Possible Left atrial enlargement Septal infarct , age undetermined Abnormal ECG No previous ECGs available Confirmed by Octaviano Glow [phone]) on 01/01/2022 3:28:51 PM ? ?Radiology ?MR Angiogram Neck W or Wo Contrast ? ?Result Date: 01/01/2022 ?CLINICAL DATA:  Stroke/TIA, determine embolic source EXAM: MRA HEAD WITHOUT CONTRAST MRA OF THE NECK WITHOUT AND WITH CONTRAST TECHNIQUE:  Angiographic images of the Circle of Willis were acquired using MRA technique without intravenous contrast. Angiographic images of the neck were acquired using MRA technique without and with intravenous contrast. Carotid stenosis measurements (when applicable) are obtained utilizing NASCET criteria, using the distal internal carotid diameter as the denominator. CONTRAST:  6.77m GADAVIST GADOBUTROL 1 MMOL/ML IV SOLN COMPARISON:  No pertinent prior exam. FINDINGS: MRA HEAD FINDINGS Mildly motion limited. Anterior circulation: Bilateral intracranial ICAs, MCAs and ACAs are patent without proximal hemodynamically significant stenosis. No aneurysm identified. Posterior circulation: Bilateral intradural vertebral arteries, basilar artery, and posterior cerebral arteries are patent without proximal hemodynamically significant stenosis. No aneurysm identified MRA NECK FINDINGS Limited evaluation proximally due to motion/artifact. Within this limitation: Aortic arch: Limited evaluation due to artifact. Great vessel origins appear to be grossly patent. Right carotid system: Patent without evidence of significant stenosis. Left carotid system: Patent without evidence of  significant stenosis. Vertebral arteries: Patent without evidence of significant stenosis. IMPRESSION: MRA head: No large vessel occlusion or proximal hemodynamically significant stenosis. MRA neck: Limited evaluation proximally without visible significant stenosis. Electronically Signed   By: Margaretha Sheffield M.D.   On: 01/01/2022 19:11  ? ?MR Brain W and Wo Contrast ? ?Result Date: 01/01/2022 ?CLINICAL DATA:  Neuro deficit, acute, stroke suspected EXAM: MRI HEAD WITHOUT AND WITH CONTRAST TECHNIQUE: Multiplanar, multiecho pulse sequences of the brain and surrounding structures were obtained without and with intravenous contrast. CONTRAST:  6.33m GADAVIST GADOBUTROL 1 MMOL/ML IV SOLN COMPARISON:  None. FINDINGS: Brain: No acute infarction, hemorrhage,  hydrocephalus, extra-axial collection or mass lesion. Vascular: See concurrent MRA for further evaluation. Skull and upper cervical spine: Mildly heterogeneous marrow without suspicious focal marrow replacing lesion. Sinuses/Orbits: Mild-

## 2022-01-01 NOTE — ED Triage Notes (Signed)
Patient reports for the last week she has been having tingling off and on down her arms. Patient reports today the tingling is mostly on her left arm and is not going away. Denies N/V/D. Denies chest pain or ShOB.  ?

## 2022-01-05 DIAGNOSIS — M9901 Segmental and somatic dysfunction of cervical region: Secondary | ICD-10-CM | POA: Diagnosis not present

## 2022-01-05 DIAGNOSIS — M9902 Segmental and somatic dysfunction of thoracic region: Secondary | ICD-10-CM | POA: Diagnosis not present

## 2022-01-05 DIAGNOSIS — M5384 Other specified dorsopathies, thoracic region: Secondary | ICD-10-CM | POA: Diagnosis not present

## 2022-01-05 DIAGNOSIS — M50123 Cervical disc disorder at C6-C7 level with radiculopathy: Secondary | ICD-10-CM | POA: Diagnosis not present

## 2022-01-12 DIAGNOSIS — M50123 Cervical disc disorder at C6-C7 level with radiculopathy: Secondary | ICD-10-CM | POA: Diagnosis not present

## 2022-01-12 DIAGNOSIS — M5384 Other specified dorsopathies, thoracic region: Secondary | ICD-10-CM | POA: Diagnosis not present

## 2022-01-12 DIAGNOSIS — M9901 Segmental and somatic dysfunction of cervical region: Secondary | ICD-10-CM | POA: Diagnosis not present

## 2022-01-12 DIAGNOSIS — M9902 Segmental and somatic dysfunction of thoracic region: Secondary | ICD-10-CM | POA: Diagnosis not present

## 2022-01-19 DIAGNOSIS — M5384 Other specified dorsopathies, thoracic region: Secondary | ICD-10-CM | POA: Diagnosis not present

## 2022-01-19 DIAGNOSIS — M9902 Segmental and somatic dysfunction of thoracic region: Secondary | ICD-10-CM | POA: Diagnosis not present

## 2022-01-19 DIAGNOSIS — M50123 Cervical disc disorder at C6-C7 level with radiculopathy: Secondary | ICD-10-CM | POA: Diagnosis not present

## 2022-01-19 DIAGNOSIS — M9901 Segmental and somatic dysfunction of cervical region: Secondary | ICD-10-CM | POA: Diagnosis not present

## 2022-01-20 DIAGNOSIS — F411 Generalized anxiety disorder: Secondary | ICD-10-CM | POA: Diagnosis not present

## 2022-01-21 DIAGNOSIS — L820 Inflamed seborrheic keratosis: Secondary | ICD-10-CM | POA: Diagnosis not present

## 2022-01-26 ENCOUNTER — Encounter: Payer: Self-pay | Admitting: Neurology

## 2022-01-26 ENCOUNTER — Other Ambulatory Visit: Payer: Self-pay

## 2022-01-26 ENCOUNTER — Ambulatory Visit (INDEPENDENT_AMBULATORY_CARE_PROVIDER_SITE_OTHER): Payer: BC Managed Care – PPO | Admitting: Neurology

## 2022-01-26 VITALS — BP 113/71 | HR 64 | Ht 66.0 in | Wt 140.0 lb

## 2022-01-26 DIAGNOSIS — M5384 Other specified dorsopathies, thoracic region: Secondary | ICD-10-CM | POA: Diagnosis not present

## 2022-01-26 DIAGNOSIS — M542 Cervicalgia: Secondary | ICD-10-CM

## 2022-01-26 DIAGNOSIS — F5104 Psychophysiologic insomnia: Secondary | ICD-10-CM | POA: Diagnosis not present

## 2022-01-26 DIAGNOSIS — M9902 Segmental and somatic dysfunction of thoracic region: Secondary | ICD-10-CM | POA: Diagnosis not present

## 2022-01-26 DIAGNOSIS — M50123 Cervical disc disorder at C6-C7 level with radiculopathy: Secondary | ICD-10-CM | POA: Diagnosis not present

## 2022-01-26 DIAGNOSIS — R202 Paresthesia of skin: Secondary | ICD-10-CM | POA: Diagnosis not present

## 2022-01-26 DIAGNOSIS — F419 Anxiety disorder, unspecified: Secondary | ICD-10-CM

## 2022-01-26 DIAGNOSIS — M9901 Segmental and somatic dysfunction of cervical region: Secondary | ICD-10-CM | POA: Diagnosis not present

## 2022-01-26 MED ORDER — SERTRALINE HCL 50 MG PO TABS
50.0000 mg | ORAL_TABLET | Freq: Every day | ORAL | 2 refills | Status: DC
Start: 2022-01-26 — End: 2022-04-22

## 2022-01-26 MED ORDER — SERTRALINE HCL 50 MG PO TABS
50.0000 mg | ORAL_TABLET | Freq: Every day | ORAL | 2 refills | Status: DC
Start: 2022-01-26 — End: 2022-01-26

## 2022-01-26 NOTE — Progress Notes (Signed)
? ?Chief Complaint  ?Patient presents with  ? New Patient (Initial Visit)  ?  Rm 14. Alone. ?NP/ED referral for L sided numbness.  ? ? ? ? ?ASSESSMENT AND PLAN ? ?Tiffany Nash is a 61 y.o. female   ?Constellation of complaints in the setting of excessive stress, depression anxiety insomnia ? Essentially normal neurological examination, ? Imaging study of neuraxis including MRI of the brain, MRA of brain and neck, MRI of the cervical and thoracic spine showed no structural abnormality to explain her complaints, ? Most likely related to her underlying stress, and mood disorder, ? Will check thyroid function, ? Encourage patient continue moderate exercise, stretching ? ? ?DIAGNOSTIC DATA (LABS, IMAGING, TESTING) ?- I reviewed patient records, labs, notes, testing and imaging myself where available. ? ? ?MEDICAL HISTORY: ? ?Tiffany Nash is a 61 year old female, seen in request by her primary care physician Dr., Dorthy Cooler, [place], for intermittent weakness and numbness ? ?I reviewed and summarized the referring note. PMHX. ?Left breast cancer, s/p lumpectomy in 2017, radiation therapy ?Migraine  ?Right thoracic shingles. ?Chronic insomnia ?Depression  ? ?She did reported increased stress in 2022, began to receive treatment for depression and anxiety at the end of 2022, in January 2023 because of chronic insomnia, she was given prescription of trazodone 100 mg every night, which has helped her sleep well ? ?On December 06, 2021, she was tossing and turning, could not falling to sleep, then decided getting up to use the bathroom, in a standing position, she felt lower extremity weakness, has to slow down to her knees, after few minutes, she was able to get up, finished using bathroom, denies loss of consciousness, denies bowel and bladder incontinence, no sensory loss ? ?A week later, she was sleeping on the sofa on her right side taking care of her elderly mother, she felt suddenly she could not use her right leg,  she was able to turn to her left side, lateral right leg hang at the edge of the sofa for few minutes, then she recovered ? ?She also reported while working out with her husband 4 PM at [employer], she felt generalized weakness, difficulty completing her usual tasks, ? ?She presented to emergency room on January 01, 2022 with complaints of intermittent numbness and weakness throughout her body, transient, lasting for few minutes, few episode in the day, ? ?She had extensive evaluation, personally reviewed MRI of the brain with without contrast, no acute intracranial abnormality, evidence of chronic sinusitis ?MRI of cervical spine, mild degenerative changes, most obvious C5-6, moderate to severe left and mild right foraminal stenosis, no cord signal abnormality, no significant canal stenosis ?MRI of the thoracic spine showed no significant abnormality ? ?MR angiogram of head and neck showed no large vessel disease ? ?Laboratory evaluations showed normal vitamin D, CBC, CMP negative troponin ? ? ?PHYSICAL EXAM: ?  ?Vitals:  ? 01/26/22 1316  ?BP: 113/71  ?Pulse: 64  ?Weight: 140 lb (63.5 kg)  ?Height: '5\' 6"'$  (1.676 m)  ? ?Not recorded ?  ? ? ?Body mass index is 22.6 kg/m?. ? ?PHYSICAL EXAMNIATION: ? ?Gen: NAD, conversant, well nourised, well groomed                     ?Cardiovascular: Regular rate rhythm, no peripheral edema, warm, nontender. ?Eyes: Conjunctivae clear without exudates or hemorrhage ?Neck: Supple, no carotid bruits. ?Pulmonary: Clear to auscultation bilaterally  ? ?NEUROLOGICAL EXAM: ? ?MENTAL STATUS: ?Speech: ?   Speech is normal;  fluent and spontaneous with normal comprehension.  ?Cognition: ?    Orientation to time, place and person ?    Normal recent and remote memory ?    Normal Attention span and concentration ?    Normal Language, naming, repeating,spontaneous speech ?    Fund of knowledge ?  ?CRANIAL NERVES: ?CN II: Visual fields are full to confrontation. Pupils are round equal and briskly reactive to  light. ?CN III, IV, VI: extraocular movement are normal. No ptosis. ?CN V: Facial sensation is intact to light touch ?CN VII: Face is symmetric with normal eye closure  ?CN VIII: Hearing is normal to causal conversation. ?CN IX, X: Phonation is normal. ?CN XI: Head turning and shoulder shrug are intact ? ?MOTOR: ?There is no pronator drift of out-stretched arms. Muscle bulk and tone are normal. Muscle strength is normal. ? ?REFLEXES: ?Reflexes are 2+ and symmetric at the biceps, triceps, knees, and ankles. Plantar responses are flexor. ? ?SENSORY: ?Intact to light touch, pinprick and vibratory sensation are intact in fingers and toes. ? ?COORDINATION: ?There is no trunk or limb dysmetria noted. ? ?GAIT/STANCE: ?Posture is normal. Gait is steady with normal steps, base, arm swing, and turning. Heel and toe walking are normal. Tandem gait is normal.  ?Romberg is absent. ? ?REVIEW OF SYSTEMS:  ?Full 14 system review of systems performed and notable only for as above ?All other review of systems were negative. ? ? ?ALLERGIES: ?Allergies  ?Allergen Reactions  ? Ibuprofen Other (See Comments)  ? Oxycodone-Acetaminophen Other (See Comments)  ?  Tachycardia  ? Tylox [Oxycodone-Acetaminophen]   ?  tachycardia  ? Tyloxapol Other (See Comments)  ? Zoledronic Acid Other (See Comments)  ? Penicillins Rash  ?  Yeast infection  ? ? ?HOME MEDICATIONS: ?Current Outpatient Medications  ?Medication Sig Dispense Refill  ? aspirin 81 MG chewable tablet Chew 1 tablet (81 mg total) by mouth daily. 30 tablet 0  ? Calcium Citrate-Vitamin D (CALCIUM CITRATE + PO) Take by mouth.    ? Cholecalciferol (VITAMIN D PO) Take 2,000 Units by mouth every other day.     ? letrozole (FEMARA) 2.5 MG tablet TAKE 1 TABLET(2.5 MG) BY MOUTH DAILY 30 tablet 5  ? Magnesium 100 MG CAPS Take 2 capsules by mouth daily.    ? sertraline (ZOLOFT) 50 MG tablet Take 1 tablet (50 mg total) by mouth daily. 30 tablet 2  ? SUMAtriptan (IMITREX) 100 MG tablet May repeat in  2 hours if headache persists or recurs. 9 tablet 6  ? traZODone (DESYREL) 100 MG tablet Take 1 tablet (100 mg total) by mouth at bedtime. 30 tablet 1  ? UNABLE TO FIND Med Name: tart cherry supplement    ? ?No current facility-administered medications for this visit.  ? ? ?PAST MEDICAL HISTORY: ?Past Medical History:  ?Diagnosis Date  ? Abnormal Pap smear   ? Breast cancer ([place])   ? Elevated hemoglobin A1c June 2017  ? 5.9  ? Family history of pancreatic cancer   ? GERD (gastroesophageal reflux disease)   ? Migraine   ? uses imitrex as needed  ? Osteopenia   ? Osteoporosis   ? Schatzki's ring   ? and hiatal hernia on EGD  ? Shingles rash 07/2015   ? shingles on right rib cage   ? TMJ (temporomandibular joint syndrome)   ? Vertigo   ? ? ?PAST SURGICAL HISTORY: ?Past Surgical History:  ?Procedure Laterality Date  ? BREAST BIOPSY  1/12  ? fibrocystic  changes   ? BREAST LUMPECTOMY WITH RADIOACTIVE SEED AND SENTINEL LYMPH NODE BIOPSY Left 04/22/2016  ? Procedure: BREAST LUMPECTOMY WITH RADIOACTIVE SEED AND SENTINEL LYMPH NODE BIOPSY;  Surgeon: Excell Seltzer, MD;  Location: [facility];  Service: General;  Laterality: Left;  ? DILATION AND CURETTAGE OF UTERUS  1991  ? ESOPHAGEAL DILATION    ? GYNECOLOGIC CRYOSURGERY  1980s  ? LAPAROSCOPIC CHOLECYSTECTOMY  1992  ? WISDOM TOOTH EXTRACTION    ? ? ?FAMILY HISTORY: ?Family History  ?Problem Relation Age of Onset  ? Diabetes Father   ? Goiter Mother   ? Heart disease Other   ? Pancreatic cancer Brother 50  ?     no genetic testing  ? Pancreatic cancer Maternal Grandfather   ?     dx. early 23s  ? ALS Maternal Aunt   ? Heart Problems Maternal Grandmother   ? Breast cancer Other   ?     dx. 7s; maternal second cousin  ? ALS Cousin   ? ? ?SOCIAL HISTORY: ?Social History  ? ?Socioeconomic History  ? Marital status: Married  ?  Spouse name: Not on file  ? Number of children: Not on file  ? Years of education: Not on file  ? Highest education level: Not on file   ?Occupational History  ? Not on file  ?Tobacco Use  ? Smoking status: Never  ? Smokeless tobacco: Never  ?Vaping Use  ? Vaping Use: Never used  ?Substance and Sexual Activity  ? Alcohol use: Yes  ?  [place]

## 2022-01-26 NOTE — Progress Notes (Signed)
Sent in refill for Zoloft to [facility] per pt's request. ?

## 2022-01-27 ENCOUNTER — Other Ambulatory Visit: Payer: Self-pay

## 2022-01-27 LAB — CK: Total CK: 84 U/L (ref 32–182)

## 2022-01-27 LAB — SEDIMENTATION RATE: Sed Rate: 2 mm/hr (ref 0–40)

## 2022-01-27 LAB — TSH: TSH: 0.843 u[IU]/mL (ref 0.450–4.500)

## 2022-01-27 LAB — C-REACTIVE PROTEIN: CRP: <1 mg/L (ref 0–10)

## 2022-01-27 LAB — ANA W/REFLEX IF POSITIVE: Anti Nuclear Antibody (ANA): NEGATIVE

## 2022-01-27 NOTE — Progress Notes (Signed)
Spoke with [facility] pharmacist with [facility] regarding refill prescription for Zoloft.  Updated pharmacist with correct fax number for prescription refill request for Dr. Truitt Merle.   ?

## 2022-01-29 DIAGNOSIS — F4320 Adjustment disorder, unspecified: Secondary | ICD-10-CM | POA: Diagnosis not present

## 2022-02-02 DIAGNOSIS — E559 Vitamin D deficiency, unspecified: Secondary | ICD-10-CM | POA: Diagnosis not present

## 2022-02-02 DIAGNOSIS — Z0000 Encounter for general adult medical examination without abnormal findings: Secondary | ICD-10-CM | POA: Diagnosis not present

## 2022-02-02 DIAGNOSIS — R7309 Other abnormal glucose: Secondary | ICD-10-CM | POA: Diagnosis not present

## 2022-02-02 DIAGNOSIS — Z23 Encounter for immunization: Secondary | ICD-10-CM | POA: Diagnosis not present

## 2022-02-02 DIAGNOSIS — E7800 Pure hypercholesterolemia, unspecified: Secondary | ICD-10-CM | POA: Diagnosis not present

## 2022-02-04 DIAGNOSIS — M50123 Cervical disc disorder at C6-C7 level with radiculopathy: Secondary | ICD-10-CM | POA: Diagnosis not present

## 2022-02-04 DIAGNOSIS — M9902 Segmental and somatic dysfunction of thoracic region: Secondary | ICD-10-CM | POA: Diagnosis not present

## 2022-02-04 DIAGNOSIS — M5384 Other specified dorsopathies, thoracic region: Secondary | ICD-10-CM | POA: Diagnosis not present

## 2022-02-04 DIAGNOSIS — M9901 Segmental and somatic dysfunction of cervical region: Secondary | ICD-10-CM | POA: Diagnosis not present

## 2022-02-10 DIAGNOSIS — F411 Generalized anxiety disorder: Secondary | ICD-10-CM | POA: Diagnosis not present

## 2022-02-10 DIAGNOSIS — M9902 Segmental and somatic dysfunction of thoracic region: Secondary | ICD-10-CM | POA: Diagnosis not present

## 2022-02-10 DIAGNOSIS — M9901 Segmental and somatic dysfunction of cervical region: Secondary | ICD-10-CM | POA: Diagnosis not present

## 2022-02-10 DIAGNOSIS — M50123 Cervical disc disorder at C6-C7 level with radiculopathy: Secondary | ICD-10-CM | POA: Diagnosis not present

## 2022-02-10 DIAGNOSIS — M5384 Other specified dorsopathies, thoracic region: Secondary | ICD-10-CM | POA: Diagnosis not present

## 2022-02-19 DIAGNOSIS — M5384 Other specified dorsopathies, thoracic region: Secondary | ICD-10-CM | POA: Diagnosis not present

## 2022-02-19 DIAGNOSIS — F4320 Adjustment disorder, unspecified: Secondary | ICD-10-CM | POA: Diagnosis not present

## 2022-02-19 DIAGNOSIS — M50123 Cervical disc disorder at C6-C7 level with radiculopathy: Secondary | ICD-10-CM | POA: Diagnosis not present

## 2022-02-19 DIAGNOSIS — M9901 Segmental and somatic dysfunction of cervical region: Secondary | ICD-10-CM | POA: Diagnosis not present

## 2022-02-19 DIAGNOSIS — M9902 Segmental and somatic dysfunction of thoracic region: Secondary | ICD-10-CM | POA: Diagnosis not present

## 2022-02-25 DIAGNOSIS — F411 Generalized anxiety disorder: Secondary | ICD-10-CM | POA: Diagnosis not present

## 2022-03-02 DIAGNOSIS — M5384 Other specified dorsopathies, thoracic region: Secondary | ICD-10-CM | POA: Diagnosis not present

## 2022-03-02 DIAGNOSIS — M9902 Segmental and somatic dysfunction of thoracic region: Secondary | ICD-10-CM | POA: Diagnosis not present

## 2022-03-02 DIAGNOSIS — M9901 Segmental and somatic dysfunction of cervical region: Secondary | ICD-10-CM | POA: Diagnosis not present

## 2022-03-02 DIAGNOSIS — M50123 Cervical disc disorder at C6-C7 level with radiculopathy: Secondary | ICD-10-CM | POA: Diagnosis not present

## 2022-03-06 ENCOUNTER — Other Ambulatory Visit: Payer: Self-pay | Admitting: Hematology

## 2022-03-06 ENCOUNTER — Other Ambulatory Visit: Payer: Self-pay | Admitting: Nurse Practitioner

## 2022-03-06 MED ORDER — TRAZODONE HCL 100 MG PO TABS: 100.0000 mg | ORAL_TABLET | Freq: Every day | ORAL | 1 refills | Status: DC

## 2022-03-12 DIAGNOSIS — J0100 Acute maxillary sinusitis, unspecified: Secondary | ICD-10-CM | POA: Diagnosis not present

## 2022-03-25 IMAGING — MR MR HEAD WO/W CM
13 series · 48 of 48 positions shown · IV contrast (gadavist)
Comparison: None.

CLINICAL DATA: Neuro deficit, acute, stroke suspected

EXAM:
MRI HEAD WITHOUT AND WITH CONTRAST
TECHNIQUE: Multiplanar, multiecho pulse sequences of the brain and surrounding
structures were obtained without and with intravenous contrast.
CONTRAST:  6.2mL GADAVIST GADOBUTROL 1 MMOL/ML IV SOLN

[Series 2: DWI · axial · 3.0mm · 1.46mm/px · z∈[-21,+140]mm · 8 of 110 slices shown (1 of 4)]
[im 1/110]
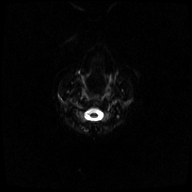
[im 16/110]
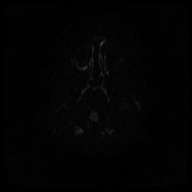
[im 32/110]
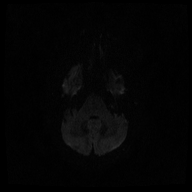
[im 47/110]
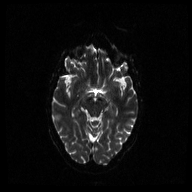
[im 63/110]
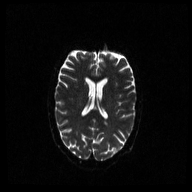
[im 78/110]
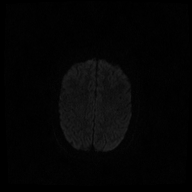
[im 94/110]
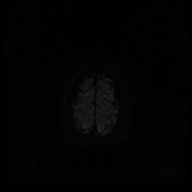
[im 110/110]
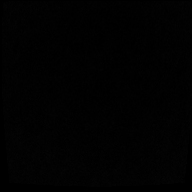

[Series 3: DWI · axial · 3.0mm · 1.46mm/px · z∈[-21,+140]mm · 3 of 55 slices shown (2 of 4)]
[im 1/55]
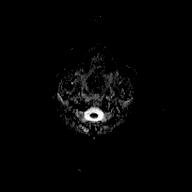
[im 28/55]
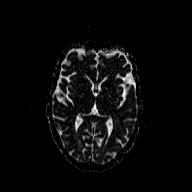
[im 55/55]
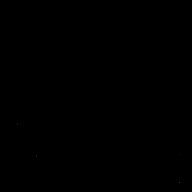

[Series 4: DWI · coronal · 5.0mm · 1.46mm/px · 4 of 66 slices shown (3 of 4)]
[im 1/66]
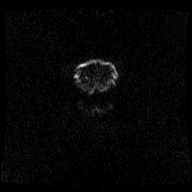
[im 22/66]
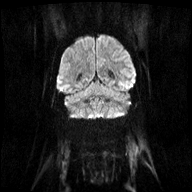
[im 44/66]
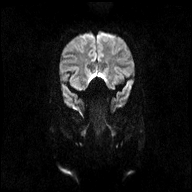
[im 66/66]
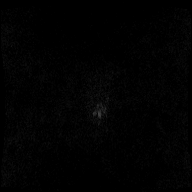

[Series 5: DWI · coronal · 5.0mm · 1.46mm/px · 2 of 33 slices shown (4 of 4)]
[im 1/33]
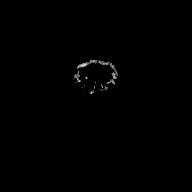
[im 33/33]
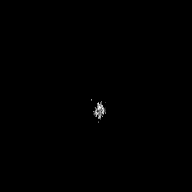

[Series 6: T1 · sagittal · 5.0mm · 0.45mm/px · 1 of 23 slices shown (1 of 2)]
[im 1/23]
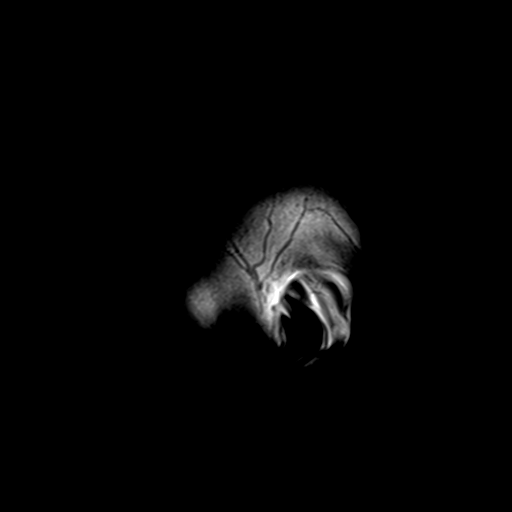

[Series 7: T2 · axial · 5.0mm · 0.72mm/px · 1 of 22 slices shown (1 of 2)]
[im 1/22]
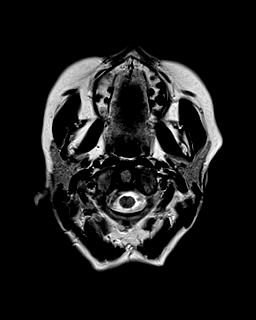

[Series 8: FLAIR · axial · 3.0mm · 0.45mm/px · z∈[-17,+135]mm · 3 of 52 slices shown]
[im 1/52]
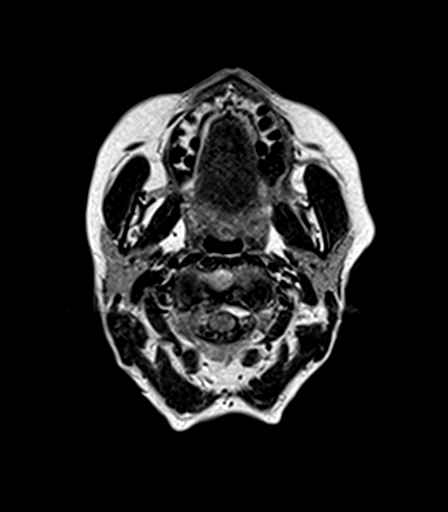
[im 26/52]
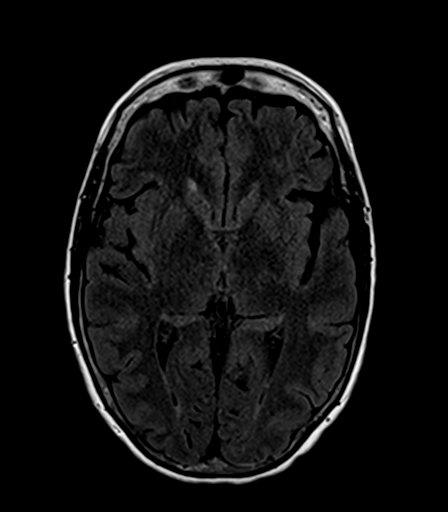
[im 52/52]
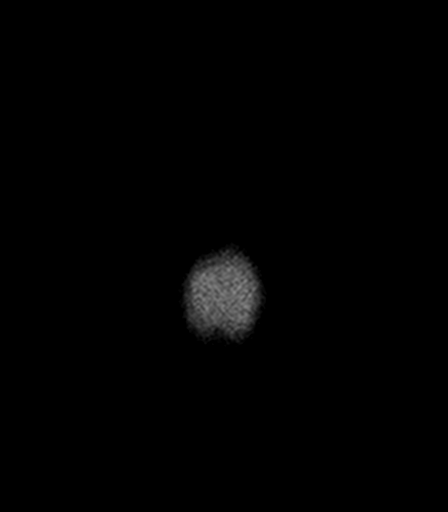

[Series 9: T2 · axial · 5.0mm · 0.72mm/px · 1 of 22 slices shown (2 of 2)]
[im 1/22]
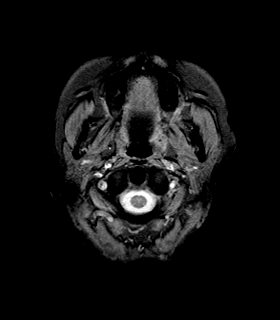

[Series 10: T1 · axial · 1.0mm · 0.94mm/px · z∈[-23,+135]mm · 10 of 160 slices shown (2 of 2)]
[im 1/160]
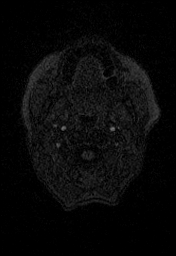
[im 18/160]
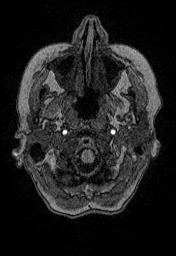
[im 36/160]
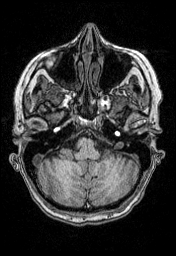
[im 54/160]
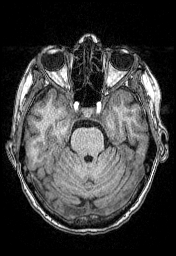
[im 71/160]
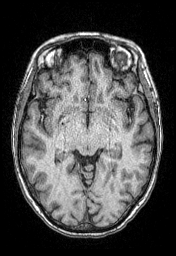
[im 89/160]
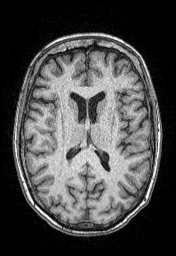
[im 107/160]
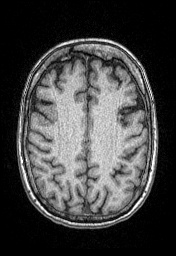
[im 124/160]
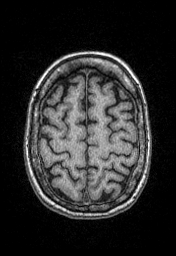
[im 142/160]
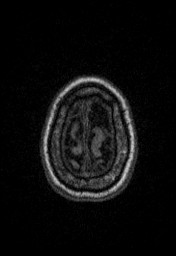
[im 160/160]
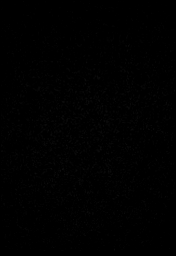

[Series 11: T2 post-contrast · coronal · 5.0mm · 0.43mm/px · 2 of 31 slices shown]
[im 1/31]
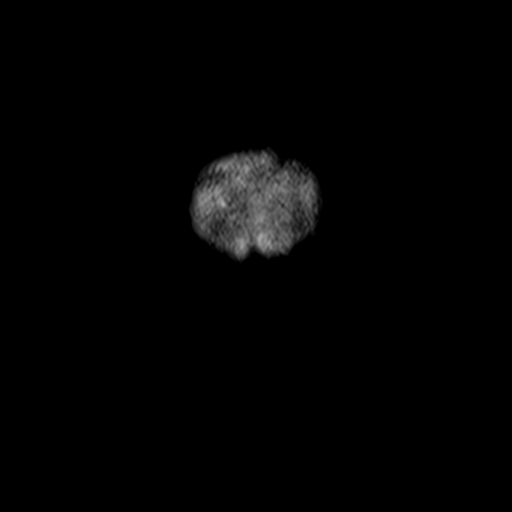
[im 31/31]
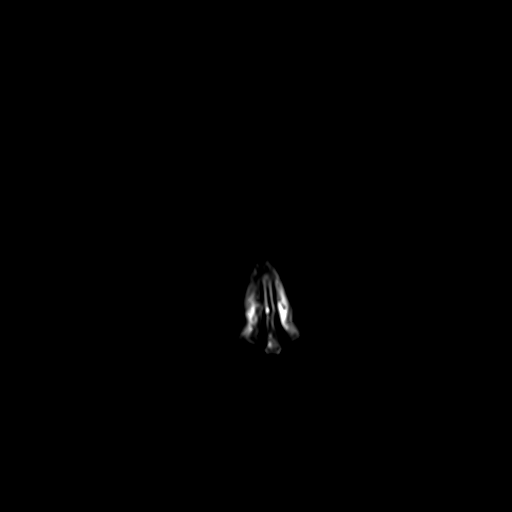

[Series 12: T1 post-contrast · axial · 1.0mm · 0.94mm/px · z∈[-23,+135]mm · 10 of 160 slices shown (1 of 3)]
[im 1/160]
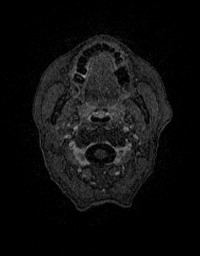
[im 18/160]
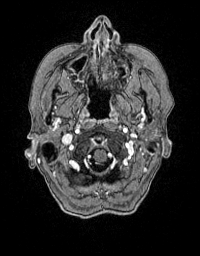
[im 36/160]
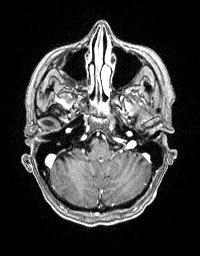
[im 54/160]
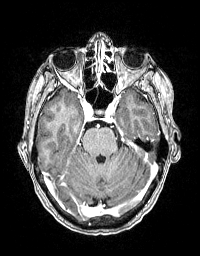
[im 71/160]
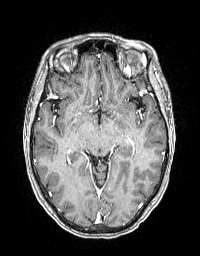
[im 89/160]
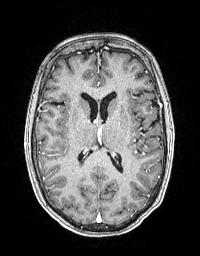
[im 107/160]
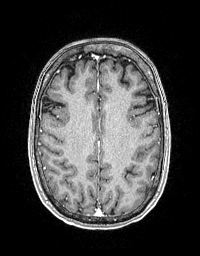
[im 124/160]
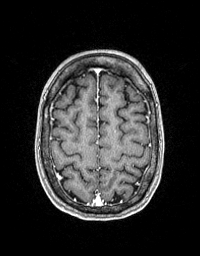
[im 142/160]
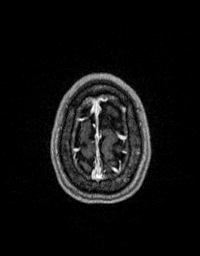
[im 160/160]
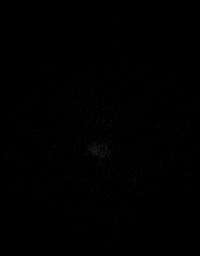

[Series 13: T1 post-contrast · coronal · 5.0mm · 0.43mm/px · 2 of 31 slices shown (2 of 3)]
[im 1/31]
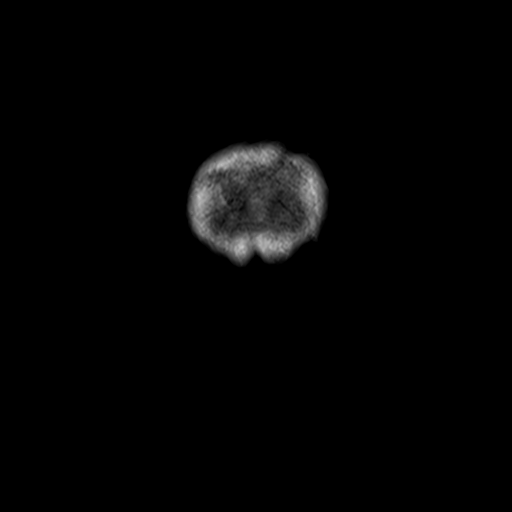
[im 31/31]
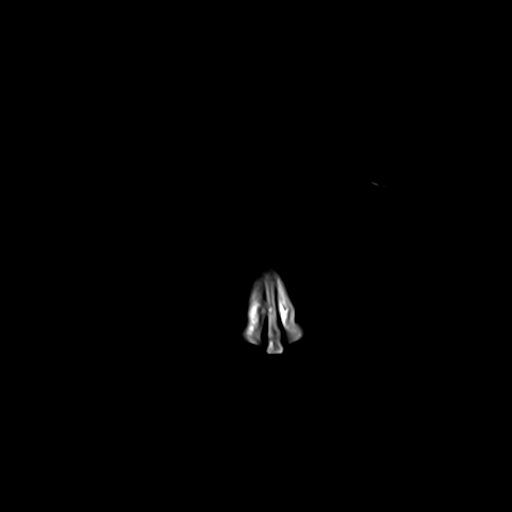

[Series 14: T1 post-contrast · sagittal · 5.0mm · 0.45mm/px · 1 of 23 slices shown (3 of 3)]
[im 1/23]
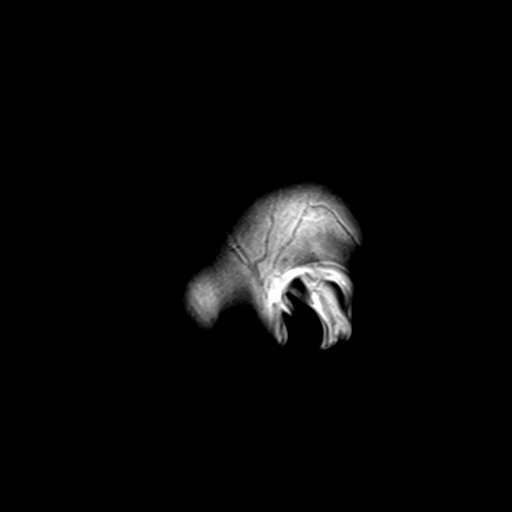

[48 of 48 positions shown; findings below may reference images not displayed]

FINDINGS: Brain: No acute infarction, hemorrhage, hydrocephalus, extra-axial
collection or mass lesion.

Vascular: See concurrent MRA for further evaluation.

Skull and upper cervical spine: Mildly heterogeneous marrow without
suspicious focal marrow replacing lesion.

Sinuses/Orbits: Mild-to-moderate paranasal sinus mucosal thickening

Other: No mastoid effusions.
IMPRESSION: No evidence of acute intracranial abnormality.

Mild-to-moderate paranasal sinus mucosal thickening.

## 2022-03-25 IMAGING — MR MR MRA NECK WO/W CM
7 of 8 series · 41 of 48 positions shown · IV contrast (GADAVIST)
Comparison: No pertinent prior exam.

CLINICAL DATA: Stroke/TIA, determine embolic source

EXAM:
MRA HEAD WITHOUT CONTRAST
MRA OF THE NECK WITHOUT AND WITH CONTRAST
TECHNIQUE: Angiographic images of the Circle of Willis were acquired using MRA
technique without intravenous contrast. Angiographic images of the
neck were acquired using MRA technique without and with intravenous
contrast. Carotid stenosis measurements (when applicable) are
obtained utilizing NASCET criteria, using the distal internal
carotid diameter as the denominator.
CONTRAST:  6.2mL GADAVIST GADOBUTROL 1 MMOL/ML IV SOLN

[Series 3: TOF · axial · 3.0mm · 0.86mm/px · z∈[-147,-8]mm · 9 of 70 slices shown]
[im 1/70]
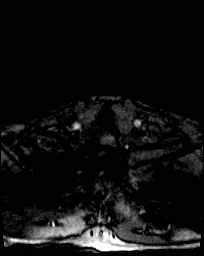
[im 9/70]
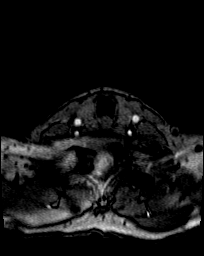
[im 18/70]
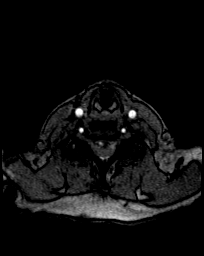
[im 26/70]
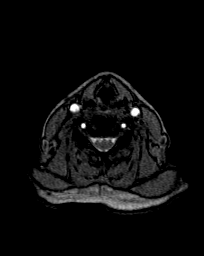
[im 35/70]
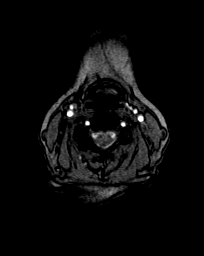
[im 44/70]
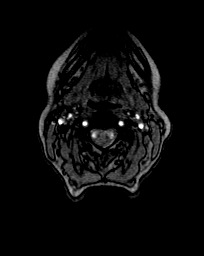
[im 52/70]
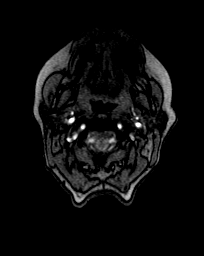
[im 61/70]
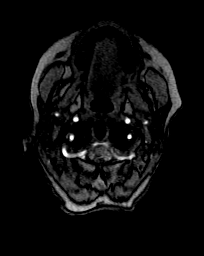
[im 70/70]
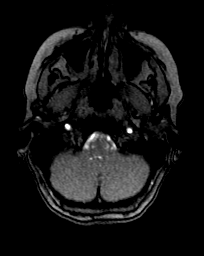

[Series 7: (id)_pre · coronal · 0.7mm · 0.78mm/px · 11 of 88 slices shown]
[im 1/88]
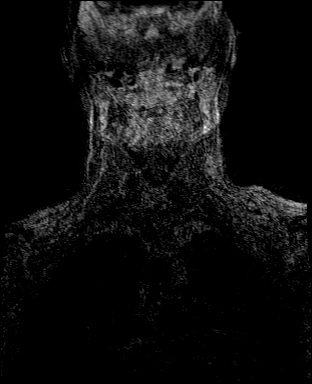
[im 9/88]
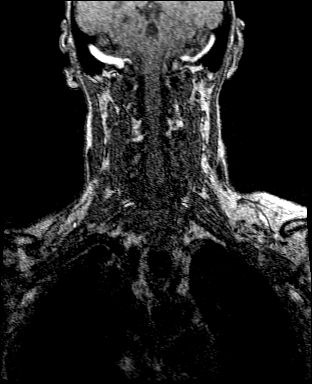
[im 18/88]
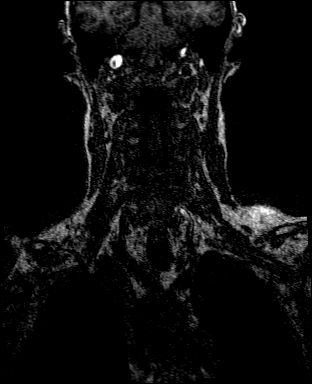
[im 27/88]
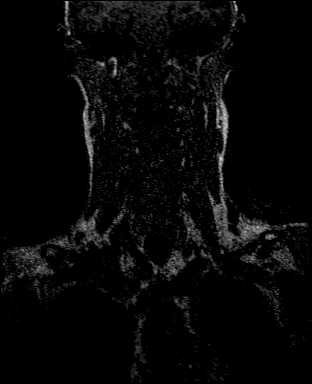
[im 35/88]
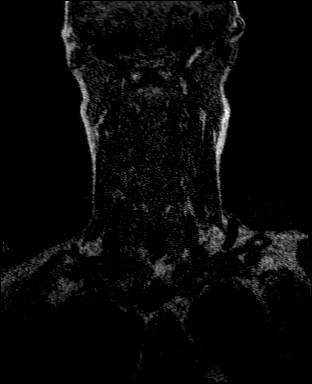
[im 44/88]
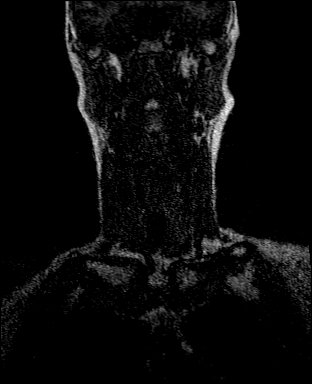
[im 53/88]
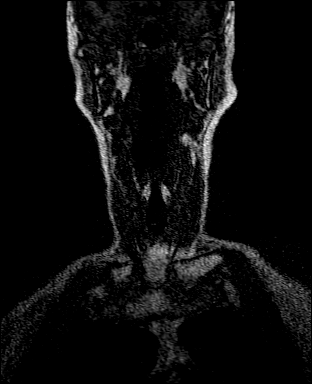
[im 61/88]
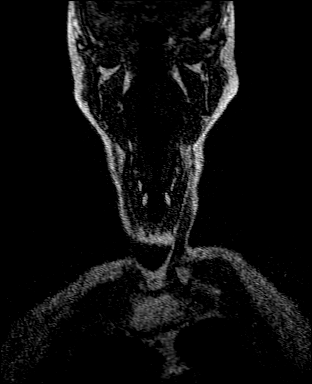
[im 70/88]
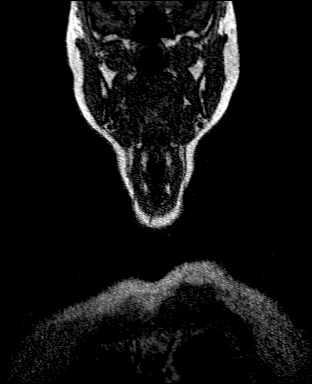
[im 79/88]
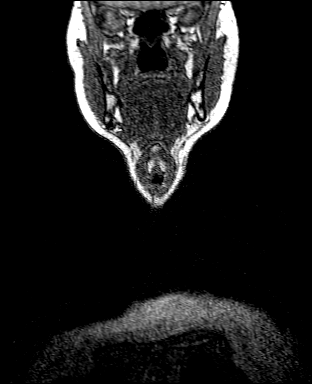
[im 88/88]
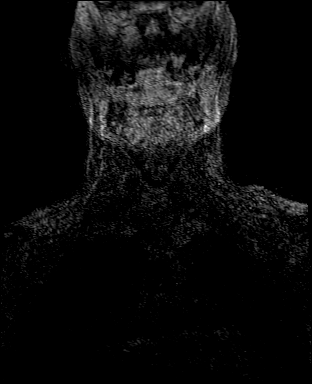

[Series 9: (id)_post · coronal · 0.7mm · 0.78mm/px · 9 of 88 slices shown]
[im 1/88]
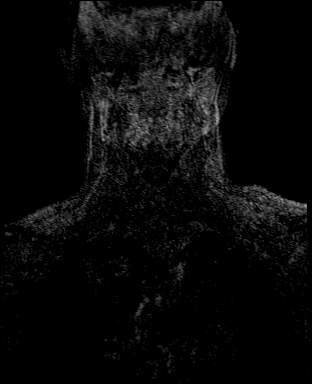
[im 16/88]
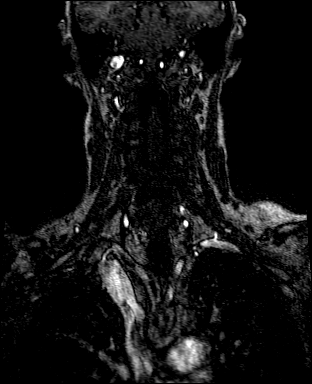
[im 24/88]
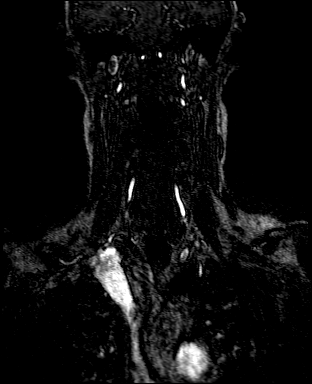
[im 40/88]
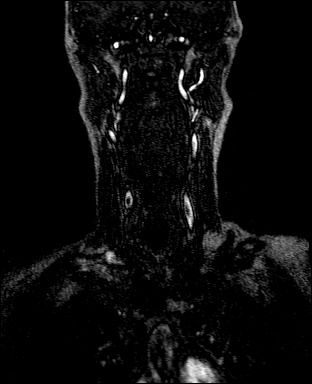
[im 48/88]
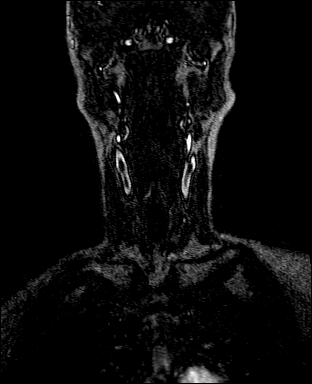
[im 64/88]
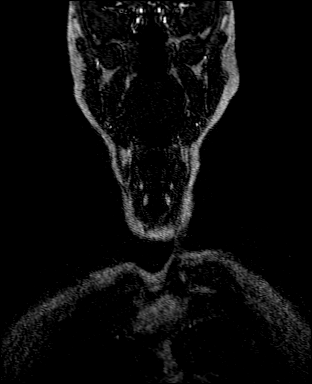
[im 72/88]
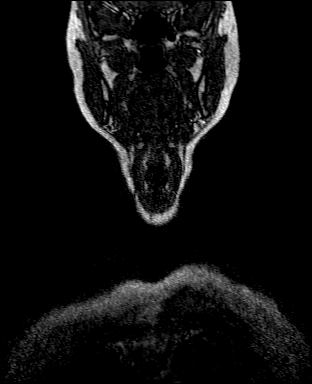
[im 80/88]
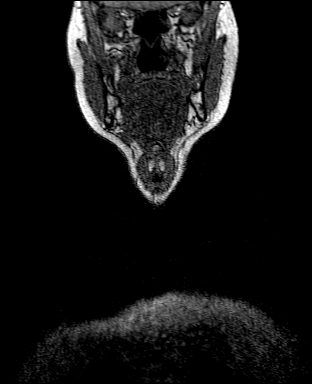
[im 88/88]
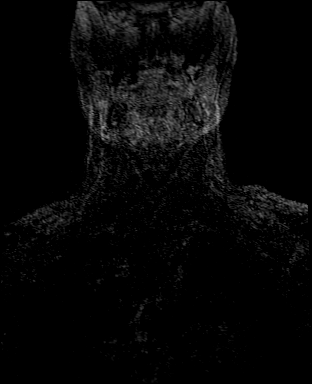

[Series 10: (id)_post_sub · coronal · 0.7mm · 0.78mm/px · 9 of 88 slices shown]
[im 1/88]
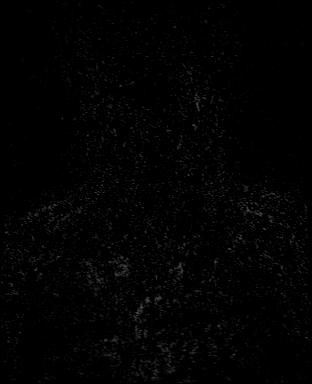
[im 16/88]
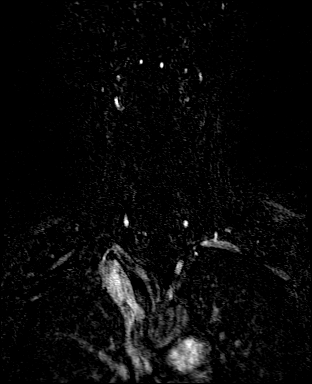
[im 24/88]
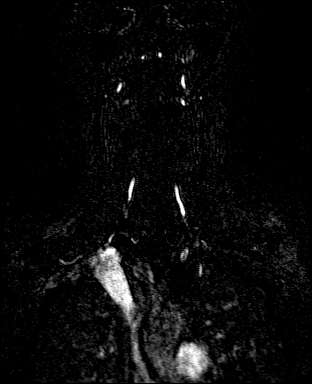
[im 40/88]
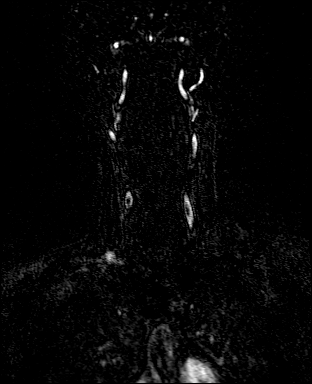
[im 48/88]
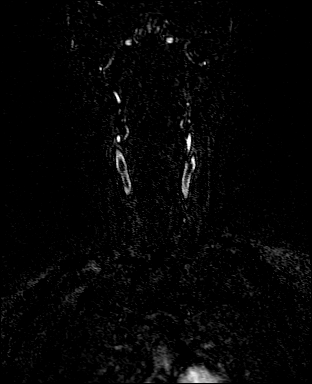
[im 64/88]
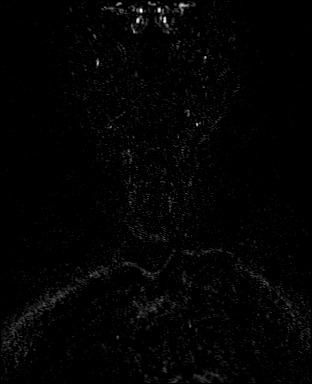
[im 72/88]
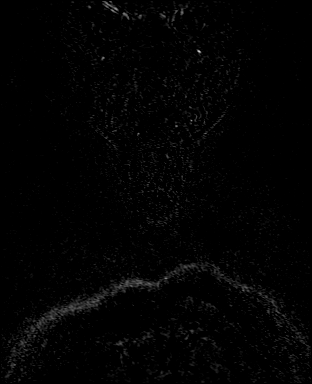
[im 80/88]
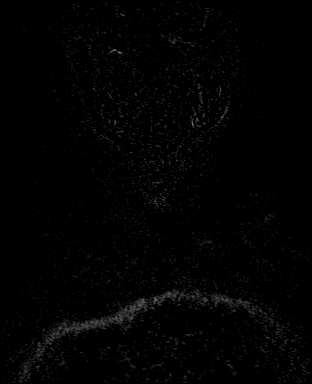
[im 88/88]
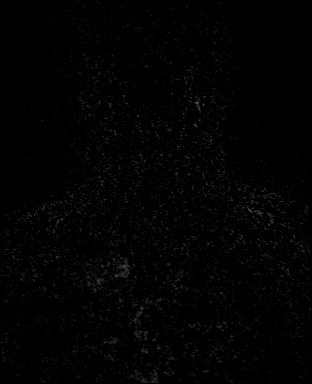

[Series 100: carotids · axial · 0.62mm/px · 1 of 2 slices shown]
[im 1/2]
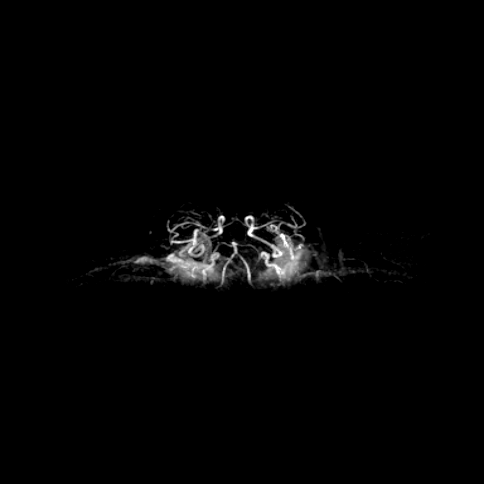

[Series 101: rcca · axial · 0.62mm/px · 1 of 1 slices shown]
[im 1/1]
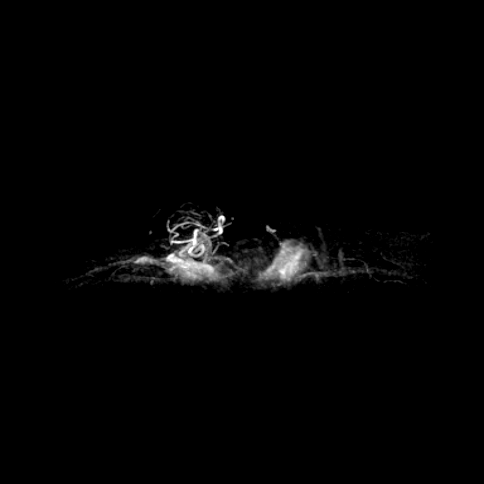

[Series 102: lcca · axial · 0.62mm/px · 1 of 1 slices shown]
[im 1/1]
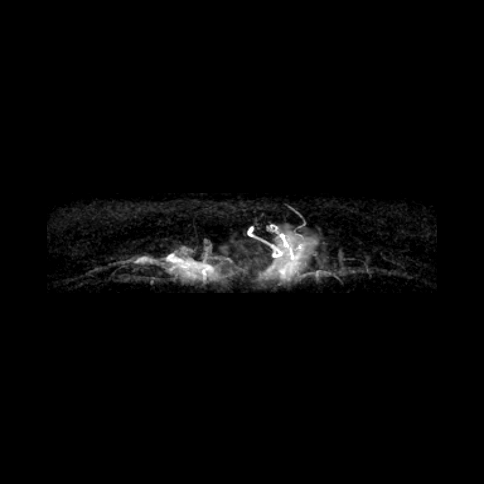

[41 of 48 positions shown; findings below may reference images not displayed]

FINDINGS: MRA HEAD FINDINGS
Mildly motion limited.

Anterior circulation: Bilateral intracranial ICAs, MCAs and ACAs are
patent without proximal hemodynamically significant stenosis. No
aneurysm identified.

Posterior circulation: Bilateral intradural vertebral arteries,
basilar artery, and posterior cerebral arteries are patent without
proximal hemodynamically significant stenosis. No aneurysm
identified

MRA NECK FINDINGS

Limited evaluation proximally due to motion/artifact. Within this
limitation:

Aortic arch: Limited evaluation due to artifact. Great vessel
origins appear to be grossly patent.

Right carotid system: Patent without evidence of significant
stenosis.

Left carotid system: Patent without evidence of significant
stenosis.

Vertebral arteries: Patent without evidence of significant stenosis.
IMPRESSION: MRA head:

No large vessel occlusion or proximal hemodynamically significant
stenosis.

MRA neck:

Limited evaluation proximally without visible significant stenosis.

## 2022-04-06 ENCOUNTER — Encounter (HOSPITAL_BASED_OUTPATIENT_CLINIC_OR_DEPARTMENT_OTHER): Payer: Self-pay | Admitting: Obstetrics & Gynecology

## 2022-04-06 ENCOUNTER — Ambulatory Visit (INDEPENDENT_AMBULATORY_CARE_PROVIDER_SITE_OTHER): Payer: BC Managed Care – PPO | Admitting: Obstetrics & Gynecology

## 2022-04-06 ENCOUNTER — Other Ambulatory Visit (HOSPITAL_COMMUNITY)
Admission: RE | Admit: 2022-04-06 | Discharge: 2022-04-06 | Disposition: A | Discharge status: Home / Self Care | Payer: BC Managed Care – PPO | Source: Ambulatory Visit | Attending: Obstetrics & Gynecology | Admitting: Obstetrics & Gynecology

## 2022-04-06 VITALS — BP 129/72 | HR 73 | Ht 66.0 in | Wt 141.0 lb

## 2022-04-06 DIAGNOSIS — Z01419 Encounter for gynecological examination (general) (routine) without abnormal findings: Secondary | ICD-10-CM | POA: Diagnosis not present

## 2022-04-06 DIAGNOSIS — C50412 Malignant neoplasm of upper-outer quadrant of left female breast: Secondary | ICD-10-CM

## 2022-04-06 DIAGNOSIS — G43109 Migraine with aura, not intractable, without status migrainosus: Secondary | ICD-10-CM | POA: Diagnosis not present

## 2022-04-06 DIAGNOSIS — Z853 Personal history of malignant neoplasm of breast: Secondary | ICD-10-CM

## 2022-04-06 DIAGNOSIS — N9089 Other specified noninflammatory disorders of vulva and perineum: Secondary | ICD-10-CM

## 2022-04-06 DIAGNOSIS — Z124 Encounter for screening for malignant neoplasm of cervix: Secondary | ICD-10-CM

## 2022-04-06 DIAGNOSIS — Z170 Estrogen receptor positive status [ER+]: Secondary | ICD-10-CM

## 2022-04-06 DIAGNOSIS — M8580 Other specified disorders of bone density and structure, unspecified site: Secondary | ICD-10-CM

## 2022-04-06 MED ORDER — SUMATRIPTAN SUCCINATE 100 MG PO TABS: ORAL_TABLET | ORAL | 6 refills | Status: DC

## 2022-04-06 MED ORDER — TRIAMCINOLONE ACETONIDE 0.1 % EX CREA: 1.0000 | TOPICAL_CREAM | Freq: Two times a day (BID) | CUTANEOUS | 0 refills | Status: AC

## 2022-04-07 LAB — CYTOLOGY - PAP
Comment: NEGATIVE
Diagnosis: NEGATIVE
High risk HPV: NEGATIVE

## 2022-04-08 ENCOUNTER — Other Ambulatory Visit: Payer: Self-pay | Admitting: Hematology

## 2022-04-10 ENCOUNTER — Telehealth (HOSPITAL_BASED_OUTPATIENT_CLINIC_OR_DEPARTMENT_OTHER): Payer: Self-pay

## 2022-04-10 ENCOUNTER — Ambulatory Visit (INDEPENDENT_AMBULATORY_CARE_PROVIDER_SITE_OTHER): Payer: BC Managed Care – PPO | Admitting: Obstetrics & Gynecology

## 2022-04-10 ENCOUNTER — Encounter (HOSPITAL_BASED_OUTPATIENT_CLINIC_OR_DEPARTMENT_OTHER): Payer: Self-pay | Admitting: Obstetrics & Gynecology

## 2022-04-10 ENCOUNTER — Other Ambulatory Visit (HOSPITAL_COMMUNITY)
Admission: RE | Admit: 2022-04-10 | Discharge: 2022-04-10 | Disposition: A | Discharge status: Home / Self Care | Payer: BC Managed Care – PPO | Source: Ambulatory Visit | Attending: Obstetrics & Gynecology | Admitting: Obstetrics & Gynecology

## 2022-04-10 VITALS — BP 142/78 | HR 64

## 2022-04-10 DIAGNOSIS — N766 Ulceration of vulva: Secondary | ICD-10-CM

## 2022-04-10 MED ORDER — ACYCLOVIR 5 % EX OINT: 1.0000 | TOPICAL_OINTMENT | CUTANEOUS | 0 refills | Status: DC

## 2022-04-10 MED ORDER — VALACYCLOVIR HCL 1 G PO TABS: 1000.0000 mg | ORAL_TABLET | Freq: Two times a day (BID) | ORAL | 0 refills | Status: DC

## 2022-04-10 NOTE — Progress Notes (Unsigned)
GYNECOLOGY  VISIT  CC:   worsening vulvar lesion  HPI: 61 y.o. [id number] Married White or Caucasian female here for recheck of vulvar lesion.  She was seen within the last week and had just returned from the beach.  Has area of vulvar erythema on her vulva with one area that has crusted over but she'd compressed this and caused superficial skin injury.  There was a little erythema as well and some itching.  She was given topical ointment for treatment but now lesion is worse and has expanded.  Called today and repeat evaluation recommended.  Denies fever or body aches.   Past Medical History:  Diagnosis Date   Abnormal Pap smear    Breast cancer ([place])    Elevated hemoglobin A1c June 2017   5.9   Family history of pancreatic cancer    GERD (gastroesophageal reflux disease)    Migraine    uses imitrex as needed   Osteopenia    Osteoporosis    Schatzki's ring    and hiatal hernia on EGD   Shingles rash 07/2015    shingles on right rib cage    TMJ (temporomandibular joint syndrome)    Vertigo     MEDS:   Current Outpatient Medications on File Prior to Visit  Medication Sig Dispense Refill   Calcium Citrate-Vitamin D (CALCIUM CITRATE + PO) Take by mouth.     Cholecalciferol (VITAMIN D PO) Take 2,000 Units by mouth every other day.      letrozole (FEMARA) 2.5 MG tablet TAKE 1 TABLET(2.5 MG) BY MOUTH DAILY 30 tablet 5   Magnesium 100 MG CAPS Take 2 capsules by mouth daily.     sertraline (ZOLOFT) 50 MG tablet Take 1 tablet (50 mg total) by mouth daily. 30 tablet 2   SUMAtriptan (IMITREX) 100 MG tablet May repeat in 2 hours if headache persists or recurs. 9 tablet 6   traZODone (DESYREL) 100 MG tablet Take 1 tablet (100 mg total) by mouth at bedtime. 30 tablet 1   triamcinolone cream (KENALOG) 0.1 % Apply 1 Application topically 2 (two) times daily. 30 g 0   UNABLE TO FIND Med Name: tart cherry supplement     No current facility-administered medications on file prior to visit.     ALLERGIES: Ibuprofen, Oxycodone-acetaminophen, Tyloxapol, Zoledronic acid, and Penicillins  SH: married, non smoker  Review of Systems  Eyes: Negative.   Genitourinary:        Itchy vulvar lesion    PHYSICAL EXAMINATION:    BP (!) 142/78 (BP Location: Right Arm, Patient Position: Sitting, Cuff Size: Large)   Pulse 64   LMP 05/12/2012     General appearance: alert, cooperative and appears stated age Lymph:  no inguinal LAD noted  Pelvic: External genitalia:  multiple vesicular lesions now present with erythematous base              Urethra:  normal appearing urethra with no masses, tenderness or lesions              Bartholins and Skenes: normal                  Chaperone, Ezekiel Ina, RN, was present for exam.  Assessment/Plan: 1. Vulvar ulceration - area concerning for HSV.  Will test and treat for now.  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 - Cervicovaginal ancillary only( [place]) - valACYclovir (VALTREX) 1000 MG tablet; Take 1 tablet (1,000 mg total) by mouth 2 (  two) times daily.  Dispense: 20 tablet; Refill: 0 - acyclovir ointment (ZOVIRAX) 5 %; Apply 1 Application topically every 3 (three) hours.  Dispense: 15 g; Refill: 0

## 2022-04-10 NOTE — Telephone Encounter (Signed)
Patient called in today with complaints of the rash that you look at on Monday has gotten a little worse. She states that she has noticed a couple new "bumps". tbw

## 2022-04-15 LAB — CERVICOVAGINAL ANCILLARY ONLY
Comment: NEGATIVE
HSV1: POSITIVE — AB
HSV2: NEGATIVE

## 2022-04-16 ENCOUNTER — Other Ambulatory Visit (HOSPITAL_BASED_OUTPATIENT_CLINIC_OR_DEPARTMENT_OTHER): Payer: Self-pay | Admitting: *Deleted

## 2022-04-16 ENCOUNTER — Other Ambulatory Visit (HOSPITAL_BASED_OUTPATIENT_CLINIC_OR_DEPARTMENT_OTHER): Payer: Self-pay | Admitting: Obstetrics & Gynecology

## 2022-04-16 MED ORDER — VALACYCLOVIR HCL 500 MG PO TABS: 500.0000 mg | ORAL_TABLET | Freq: Two times a day (BID) | ORAL | 0 refills | Status: AC

## 2022-04-16 NOTE — Progress Notes (Signed)
Rx sent to pharmacy for treatment of possible HSV outbreaks

## 2022-04-21 DIAGNOSIS — F411 Generalized anxiety disorder: Secondary | ICD-10-CM | POA: Diagnosis not present

## 2022-04-22 ENCOUNTER — Other Ambulatory Visit: Payer: Self-pay | Admitting: Hematology

## 2022-04-22 ENCOUNTER — Other Ambulatory Visit (HOSPITAL_BASED_OUTPATIENT_CLINIC_OR_DEPARTMENT_OTHER): Payer: Self-pay | Admitting: Obstetrics & Gynecology

## 2022-04-22 DIAGNOSIS — F4320 Adjustment disorder, unspecified: Secondary | ICD-10-CM | POA: Diagnosis not present

## 2022-04-22 DIAGNOSIS — N9089 Other specified noninflammatory disorders of vulva and perineum: Secondary | ICD-10-CM

## 2022-05-26 DIAGNOSIS — F4320 Adjustment disorder, unspecified: Secondary | ICD-10-CM | POA: Diagnosis not present

## 2022-05-27 DIAGNOSIS — F411 Generalized anxiety disorder: Secondary | ICD-10-CM | POA: Diagnosis not present

## 2022-07-16 ENCOUNTER — Inpatient Hospital Stay: Payer: BC Managed Care – PPO | Attending: Nurse Practitioner

## 2022-07-16 ENCOUNTER — Other Ambulatory Visit: Payer: Self-pay

## 2022-07-16 ENCOUNTER — Encounter: Payer: Self-pay | Admitting: Nurse Practitioner

## 2022-07-16 ENCOUNTER — Inpatient Hospital Stay (HOSPITAL_BASED_OUTPATIENT_CLINIC_OR_DEPARTMENT_OTHER): Payer: BC Managed Care – PPO | Admitting: Nurse Practitioner

## 2022-07-16 VITALS — BP 121/73 | HR 70 | Temp 97.8°F | Resp 14 | Wt 148.6 lb

## 2022-07-16 DIAGNOSIS — Z170 Estrogen receptor positive status [ER+]: Secondary | ICD-10-CM

## 2022-07-16 DIAGNOSIS — Z886 Allergy status to analgesic agent status: Secondary | ICD-10-CM | POA: Insufficient documentation

## 2022-07-16 DIAGNOSIS — M810 Age-related osteoporosis without current pathological fracture: Secondary | ICD-10-CM | POA: Diagnosis not present

## 2022-07-16 DIAGNOSIS — Z79899 Other long term (current) drug therapy: Secondary | ICD-10-CM | POA: Diagnosis not present

## 2022-07-16 DIAGNOSIS — E2839 Other primary ovarian failure: Secondary | ICD-10-CM

## 2022-07-16 DIAGNOSIS — Z880 Allergy status to penicillin: Secondary | ICD-10-CM | POA: Insufficient documentation

## 2022-07-16 DIAGNOSIS — G4700 Insomnia, unspecified: Secondary | ICD-10-CM | POA: Insufficient documentation

## 2022-07-16 DIAGNOSIS — C50412 Malignant neoplasm of upper-outer quadrant of left female breast: Secondary | ICD-10-CM | POA: Insufficient documentation

## 2022-07-16 DIAGNOSIS — Z800 Family history of malignant neoplasm of digestive organs: Secondary | ICD-10-CM | POA: Diagnosis not present

## 2022-07-16 DIAGNOSIS — Z79811 Long term (current) use of aromatase inhibitors: Secondary | ICD-10-CM | POA: Insufficient documentation

## 2022-07-16 DIAGNOSIS — Z9049 Acquired absence of other specified parts of digestive tract: Secondary | ICD-10-CM | POA: Diagnosis not present

## 2022-07-16 LAB — CBC WITH DIFFERENTIAL (CANCER CENTER ONLY)
Abs Immature Granulocytes: 0 10*3/uL (ref 0.00–0.07)
Basophils Absolute: 0 10*3/uL (ref 0.0–0.1)
Basophils Relative: 1 %
Eosinophils Absolute: 0.5 10*3/uL (ref 0.0–0.5)
Eosinophils Relative: 8 %
HCT: 39 % (ref 36.0–46.0)
Hemoglobin: 13.3 g/dL (ref 12.0–15.0)
Immature Granulocytes: 0 %
Lymphocytes Relative: 42 %
Lymphs Abs: 2.5 10*3/uL (ref 0.7–4.0)
MCH: 32.6 pg (ref 26.0–34.0)
MCHC: 34.1 g/dL (ref 30.0–36.0)
MCV: 95.6 fL (ref 80.0–100.0)
Monocytes Absolute: 0.5 10*3/uL (ref 0.1–1.0)
Monocytes Relative: 9 %
Neutro Abs: 2.3 10*3/uL (ref 1.7–7.7)
Neutrophils Relative %: 40 %
Platelet Count: 267 10*3/uL (ref 150–400)
RBC: 4.08 MIL/uL (ref 3.87–5.11)
RDW: 12.3 % (ref 11.5–15.5)
WBC Count: 5.8 10*3/uL (ref 4.0–10.5)
nRBC: 0 % (ref 0.0–0.2)

## 2022-07-16 LAB — CMP (CANCER CENTER ONLY)
ALT: 16 U/L (ref 0–44)
AST: 18 U/L (ref 15–41)
Albumin: 4.6 g/dL (ref 3.5–5.0)
Alkaline Phosphatase: 53 U/L (ref 38–126)
Anion gap: 5 (ref 5–15)
BUN: 17 mg/dL (ref 8–23)
CO2: 31 mmol/L (ref 22–32)
Calcium: 9.7 mg/dL (ref 8.9–10.3)
Chloride: 101 mmol/L (ref 98–111)
Creatinine: 0.79 mg/dL (ref 0.44–1.00)
GFR, Estimated: >60 mL/min (ref >60)
Glucose, Bld: 119 mg/dL — ABNORMAL HIGH (ref 70–99)
Potassium: 4.5 mmol/L (ref 3.5–5.1)
Sodium: 137 mmol/L (ref 135–145)
Total Bilirubin: 0.5 mg/dL (ref 0.3–1.2)
Total Protein: 7.6 g/dL (ref 6.5–8.1)

## 2022-07-16 LAB — VITAMIN D 25 HYDROXY (VIT D DEFICIENCY, FRACTURES): Vit D, 25-Hydroxy: 37.43 ng/mL (ref 30–100)

## 2022-07-16 MED ORDER — LETROZOLE 2.5 MG PO TABS: ORAL_TABLET | ORAL | 3 refills | Status: DC

## 2022-07-16 NOTE — Progress Notes (Signed)
[place]   [phone] [phone]   Clinic Follow up Note   Patient Care Team: Lujean Amel, MD as PCP - General (Family Medicine) Excell Seltzer, MD (Inactive) as Consulting Physician (General Surgery) Truitt Merle, MD as Consulting Physician (Hematology) Kyung Rudd, MD as Consulting Physician (Radiation Oncology) 07/16/2022  CHIEF COMPLAINT: Follow-up left breast cancer  SUMMARY OF ONCOLOGIC HISTORY: Oncology History Overview Note  Breast cancer of upper-outer quadrant of left female breast [facility])   Staging form: Breast, AJCC 7th Edition   - Clinical stage from 04/15/2016: Stage IA (T1b, N0, M0) - Unsigned         Staging comments: Staged at breast conference on 7.5.17   - Pathologic stage from 04/22/2016: Stage IA (T1c, N0, cM0) - Unsigned      Breast cancer of upper-outer quadrant of left female breast ([place])  04/02/2016 Mammogram   1 cm oval massin the upper outer quadrant of left breast, suspicious for malignancy.    04/08/2016 Initial Diagnosis   Breast cancer of upper-outer quadrant of left female breast ([place])   04/08/2016 Initial Biopsy   Left breast core needle biopsy showed invasive ductal carcinoma and DCIS, grade 1-2.   04/08/2016 Receptors her2   Your 100% positive, strong staining, PR 70% positive, strong staining, HER-2 negative, Ki-67 15%   04/22/2016 Surgery   Left breast lumpectomy and SLN biopsy ([place])   04/22/2016 Pathology Results   Left breast lumpectomy showed invasive ductal carcinoma, grade 1, 1.2 cm, low-grade DCIS, surgical margins were negative, 3 sentinel lymph nodes negative.   04/22/2016 Oncotype testing   RS 17, low risk, predicts 10 year distant recurrence risk of 11% with tamoxifen   05/26/2016 - 07/13/2016 Radiation Therapy   Adjuvant breast radiation [facility]): Left Breast/ 50.4 Gy in 28 fx.  Boost/ 10 Gy in 5 fx   07/2016 -  Anti-estrogen oral therapy   Letrozole 2.5 mg daily. Planned duration of  therapy: 5-10 years.  Switched to Tamoxifen from 09/2018-12/2018 due to osteporosis, but due to poor tolerance we switched her to Letrozole again in 12/2018.    08/27/2016 Imaging   DEXA scan: Osteopenia. (T-score -2.4).    11/22/2017 Pathology Results   Diagnosis 11/22/17 Breast, left, needle core biopsy - DENSE STROMAL FIBROSIS, CONSISTENT WITH PRIOR SURGICAL PROCEDURE. - THERE IS NO EVIDENCE OF MALIGNANCY. - SEE COMMENT.   05/24/2020 Genetic Testing   Negative genetic testing:  No pathogenic variants detected on the [facility] Common Hereditary Cancers Panel. The report date is 05/24/2020.   The Common Hereditary Cancers Panel offered by [facility] includes sequencing and/or deletion duplication testing of the following 48 genes: APC, ATM, AXIN2, BARD1, BMPR1A, BRCA1, BRCA2, BRIP1, CDH1, CDK4, CDKN2A (p14ARF), CDKN2A (p16INK4a), CHEK2, CTNNA1, DICER1, EPCAM (Deletion/duplication testing only), GREM1 (promoter region deletion/duplication testing only), KIT, MEN1, MLH1, MSH2, MSH3, MSH6, MUTYH, NBN, NF1, NTHL1, PALB2, PDGFRA, PMS2, POLD1, POLE, PTEN, RAD50, RAD51C, RAD51D, RNF43, SDHB, SDHC, SDHD, SMAD4, SMARCA4. STK11, TP53, TSC1, TSC2, and VHL.  The following genes were evaluated for sequence changes only: SDHA and HOXB13 c.251G>A variant only.     CURRENT THERAPY:  Letrozole 2.5 mg daily started in 07/2016. Switched to Tamoxifen from 09/2018-12/2018 due to osteoporosis, but due to poor tolerance we switched her to Letrozole again in 12/2018. Total 7 years to complete 07/2023   INTERVAL HISTORY: [name] returns for follow-up as scheduled, last seen by Dr. Burr Medico 12/31/2021.  She continues letrozole, tolerating well without major side effects. Occasionally her hands feel  warm but no overt hot flash. Denies bone/joint pain or breast concerns. She alternates trazodone, benadryl, and Nyquil for sleep.  DEXA and mammogram due at the end of this year.  She takes calcium, magnesium, and vitamin D when she  remembers.  All other systems were reviewed with the patient and are negative.  MEDICAL HISTORY:  Past Medical History:  Diagnosis Date   Abnormal Pap smear    Breast cancer ([place])    Elevated hemoglobin A1c June 2017   5.9   Family history of pancreatic cancer    GERD (gastroesophageal reflux disease)    Migraine    uses imitrex as needed   Osteopenia    Osteoporosis    Schatzki's ring    and hiatal hernia on EGD   Shingles rash 07/2015    shingles on right rib cage    TMJ (temporomandibular joint syndrome)    Vertigo     SURGICAL HISTORY: Past Surgical History:  Procedure Laterality Date   BREAST BIOPSY  1/12   fibrocystic changes    BREAST LUMPECTOMY WITH RADIOACTIVE SEED AND SENTINEL LYMPH NODE BIOPSY Left 04/22/2016   Procedure: BREAST LUMPECTOMY WITH RADIOACTIVE SEED AND SENTINEL LYMPH NODE BIOPSY;  Surgeon: Excell Seltzer, MD;  Location: [facility];  Service: General;  Laterality: Left;   [place] EXTRACTION      I have reviewed the social history and family history with the patient and they are unchanged from previous note.  ALLERGIES:  is allergic to ibuprofen, oxycodone-acetaminophen, tyloxapol, zoledronic acid, and penicillins.  MEDICATIONS:  Current Outpatient Medications  Medication Sig Dispense Refill   acyclovir ointment (ZOVIRAX) 5 % Apply 1 Application topically every 3 (three) hours. 15 g 0   Calcium Citrate-Vitamin D (CALCIUM CITRATE + PO) Take by mouth.     Cholecalciferol (VITAMIN D PO) Take 2,000 Units by mouth every other day.      letrozole (FEMARA) 2.5 MG tablet TAKE 1 TABLET(2.5 MG) BY MOUTH DAILY 90 tablet 3   Magnesium 100 MG CAPS Take 2 capsules by mouth daily.     sertraline (ZOLOFT) 50 MG tablet TAKE 1 TABLET BY MOUTH EVERY DAY 30 tablet 2   SUMAtriptan (IMITREX) 100 MG tablet May  repeat in 2 hours if headache persists or recurs. 9 tablet 6   traZODone (DESYREL) 100 MG tablet Take 1 tablet (100 mg total) by mouth at bedtime. 30 tablet 1   triamcinolone cream (KENALOG) 0.1 % Apply 1 Application topically 2 (two) times daily. 30 g 0   UNABLE TO FIND Med Name: tart cherry supplement     No current facility-administered medications for this visit.    PHYSICAL EXAMINATION: ECOG PERFORMANCE STATUS: 0 - Asymptomatic  Vitals:   07/16/22 1044  BP: 121/73  Pulse: 70  Resp: 14  Temp: 97.8 F (36.6 C)  SpO2: 97%   Filed Weights   07/16/22 1044  Weight: 148 lb 9.6 oz (67.4 kg)    GENERAL:alert, no distress and comfortable SKIN: No rash.  Multiple seborrheic keratoses over the chest and trunk EYES: sclera clear NECK: Without mass LYMPH:  no palpable cervical or supraclavicular lymphadenopathy  LUNGS: clear with normal breathing effort HEART: regular rate & rhythm, no lower extremity edema ABDOMEN:abdomen soft, non-tender and normal bowel sounds NEURO: alert & oriented x 3 with fluent speech, no focal  motor/sensory deficits Breast exam: Breasts are symmetrical without nipple discharge or inversion.  S/p left lumpectomy, incisions completely healed with some scar tissue.  Dense breast tissue throughout without palpable mass or nodularity in either breast or axilla that I could appreciate  LABORATORY DATA:  I have reviewed the data as listed    Latest Ref Rng & Units 07/16/2022   10:18 AM 01/01/2022   12:51 PM 12/31/2021   11:10 AM  CBC  WBC 4.0 - 10.5 K/uL 5.8  5.9  5.2   Hemoglobin 12.0 - 15.0 g/dL 13.3  13.2  13.5   Hematocrit 36.0 - 46.0 % 39.0  40.5  41.2   Platelets 150 - 400 K/uL 267  282  289         Latest Ref Rng & Units 07/16/2022   10:18 AM 01/01/2022   12:51 PM 12/31/2021   11:10 AM  CMP  Glucose 70 - 99 mg/dL 119  93  120   BUN 8 - 23 mg/dL 17  19  15    Creatinine 0.44 - 1.00 mg/dL 0.79  0.80  0.77   Sodium 135 - 145 mmol/L 137  139  137    Potassium 3.5 - 5.1 mmol/L 4.5  3.8  4.4   Chloride 98 - 111 mmol/L 101  100  102   CO2 22 - 32 mmol/L 31  30  30    Calcium 8.9 - 10.3 mg/dL 9.7  10.3  10.1   Total Protein 6.5 - 8.1 g/dL 7.6   7.7   Total Bilirubin 0.3 - 1.2 mg/dL 0.5   0.5   Alkaline Phos 38 - 126 U/L 53   62   AST 15 - 41 U/L 18   19   ALT 0 - 44 U/L 16   20       RADIOGRAPHIC STUDIES: I have personally reviewed the radiological images as listed and agreed with the findings in the report. No results found.   ASSESSMENT & PLAN: Vaishali' E Tippetts is a 61 y.o. female with    1. Breast cancer of lower-outer quadrant of left female breast, invasive and in situ ductal carcinoma, grade 1, pT1cN0M0, stage IA, 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 S/p left breast lumpectomy and adjuvant radiation. Oncotype RS showed low risk, 17, which predicts 10 year distant recurrence after 5 years of tamoxifen 11%. Chemotherapy was not recommended  -She started anti-estrogen therapy with letrozole in 07/2016. Due to Osteoporosis she was switched to Tamoxifen in 09/2018 but did not tolerate well due to insomnia and vaginal discharge. Switched back to Letrozole in 12/2018.   Goal is total 5-7 years -She has breast density category C, her initial breast cancer in 03/2016 was diagnosed 3 months after a negative mammogram, she is interested in additional breast screening MRI which was approved and scheduled in 01/2020 but patient did not show. -Ms. Rosol is clinically doing well.  Tolerating letrozole.  Breast exam is benign, labs are unremarkable.  Overall no clinical concern for breast cancer recurrence. -She is 6 years from definitive treatment, the recurrence risk has significantly decreased. -Continue breast cancer surveillance and letrozole, she will complete 7 years in 07/2023 -We discussed 6 versus 86-monthfollow-up and both are comfortable with 1 year, but will call [place] she would like to come in  sooner   2. Osteoporosis -[facility] with Lowest T-score of -2.8 at [facility].  -She tried Zometa  on 01/02/19 but had severe flu like reaction with fever lasting 2 weeks. This was discontinued   -continue weight bearing exercises and daily calcium and vitamin D  -Repeat with mammogram 09/2022   3. Family history  -she updated me today that one of her brothers and MGF had pancreatic cancer, and one of her sisters had a pancreatic mass that was benign.  -Pt has 4 children, and she is 1 of 5 siblings.  -She underwent genetic testing by [facility] Common hereditary cancers panel, report on 05/24/2020 was negative for pathogenic variants   4.  Insomnia -Pre-existing, worsened after cancer diagnosis and tamoxifen.  He tried Lunesta and melatonin in the past which were not helpful -Currently alternating trazodone, Benadryl, and NyQuil; she knows not to take them together   Plan: -Labs reviewed -Continue breast cancer surveillance -Continue letrozole through 07/2023 at which time she will complete 7 years -DEXA and mammogram in 09/2022; continue calcium, vitamin D, and weightbearing exercise -Follow-up in 1 year, or sooner if needed    Orders Placed This Encounter  Procedures   MM 3D SCREEN BREAST BILATERAL    Standing Status:   Future    Standing Expiration Date:   07/17/2023    Scheduling Instructions:     [facility]    Order Specific Question:   Reason for Exam (SYMPTOM  OR DIAGNOSIS REQUIRED)    Answer:   h/o left breast cancer 03/2016    Order Specific Question:   Preferred imaging location?    Answer:   External   DG Bone Density    Standing Status:   Future    Standing Expiration Date:   07/16/2023    Scheduling Instructions:     [facility]    Order Specific Question:   Reason for Exam (SYMPTOM  OR DIAGNOSIS REQUIRED)    Answer:   osteopenia, on letrozole for h/o breast cancer 2017    Order Specific Question:   Preferred imaging location?    Answer:    External   All questions were answered. The patient knows to call the clinic with any problems, questions or concerns. No barriers to learning were detected.      Alla Feeling, NP 07/16/22

## 2022-07-17 ENCOUNTER — Other Ambulatory Visit: Payer: Self-pay | Admitting: Hematology

## 2022-07-30 DIAGNOSIS — F4320 Adjustment disorder, unspecified: Secondary | ICD-10-CM | POA: Diagnosis not present

## 2022-08-05 DIAGNOSIS — K5792 Diverticulitis of intestine, part unspecified, without perforation or abscess without bleeding: Secondary | ICD-10-CM | POA: Diagnosis not present

## 2022-08-05 DIAGNOSIS — K629 Disease of anus and rectum, unspecified: Secondary | ICD-10-CM | POA: Diagnosis not present

## 2022-08-21 DIAGNOSIS — F411 Generalized anxiety disorder: Secondary | ICD-10-CM | POA: Diagnosis not present

## 2022-09-18 DIAGNOSIS — R051 Acute cough: Secondary | ICD-10-CM | POA: Diagnosis not present

## 2022-09-23 ENCOUNTER — Other Ambulatory Visit: Payer: Self-pay | Admitting: Hematology

## 2022-10-19 ENCOUNTER — Encounter: Payer: Self-pay | Admitting: Hematology

## 2022-11-09 DIAGNOSIS — J0100 Acute maxillary sinusitis, unspecified: Secondary | ICD-10-CM | POA: Diagnosis not present

## 2022-11-11 DIAGNOSIS — F411 Generalized anxiety disorder: Secondary | ICD-10-CM | POA: Diagnosis not present

## 2022-11-23 DIAGNOSIS — L820 Inflamed seborrheic keratosis: Secondary | ICD-10-CM | POA: Diagnosis not present

## 2022-12-23 ENCOUNTER — Other Ambulatory Visit: Payer: Self-pay | Admitting: Hematology

## 2023-01-13 DIAGNOSIS — H903 Sensorineural hearing loss, bilateral: Secondary | ICD-10-CM | POA: Diagnosis not present

## 2023-02-17 DIAGNOSIS — Z0000 Encounter for general adult medical examination without abnormal findings: Secondary | ICD-10-CM | POA: Diagnosis not present

## 2023-02-17 DIAGNOSIS — F321 Major depressive disorder, single episode, moderate: Secondary | ICD-10-CM | POA: Diagnosis not present

## 2023-02-17 DIAGNOSIS — R7309 Other abnormal glucose: Secondary | ICD-10-CM | POA: Diagnosis not present

## 2023-02-17 DIAGNOSIS — E7800 Pure hypercholesterolemia, unspecified: Secondary | ICD-10-CM | POA: Diagnosis not present

## 2023-03-17 ENCOUNTER — Other Ambulatory Visit: Payer: Self-pay | Admitting: Hematology

## 2023-04-08 ENCOUNTER — Ambulatory Visit (INDEPENDENT_AMBULATORY_CARE_PROVIDER_SITE_OTHER): Payer: BC Managed Care – PPO | Admitting: Obstetrics & Gynecology

## 2023-04-08 ENCOUNTER — Encounter (HOSPITAL_BASED_OUTPATIENT_CLINIC_OR_DEPARTMENT_OTHER): Payer: Self-pay | Admitting: Obstetrics & Gynecology

## 2023-04-08 VITALS — BP 121/71 | HR 69 | Ht 65.0 in | Wt 149.6 lb

## 2023-04-08 DIAGNOSIS — R8290 Unspecified abnormal findings in urine: Secondary | ICD-10-CM

## 2023-04-08 DIAGNOSIS — M85851 Other specified disorders of bone density and structure, right thigh: Secondary | ICD-10-CM

## 2023-04-08 DIAGNOSIS — G43109 Migraine with aura, not intractable, without status migrainosus: Secondary | ICD-10-CM | POA: Diagnosis not present

## 2023-04-08 DIAGNOSIS — Z01419 Encounter for gynecological examination (general) (routine) without abnormal findings: Secondary | ICD-10-CM

## 2023-04-08 DIAGNOSIS — Z170 Estrogen receptor positive status [ER+]: Secondary | ICD-10-CM

## 2023-04-08 DIAGNOSIS — C50412 Malignant neoplasm of upper-outer quadrant of left female breast: Secondary | ICD-10-CM

## 2023-04-08 DIAGNOSIS — M85852 Other specified disorders of bone density and structure, left thigh: Secondary | ICD-10-CM

## 2023-04-08 DIAGNOSIS — N766 Ulceration of vulva: Secondary | ICD-10-CM

## 2023-04-08 LAB — POCT URINALYSIS DIPSTICK
Bilirubin, UA: NEGATIVE
Blood, UA: NEGATIVE
Glucose, UA: NEGATIVE
Ketones, UA: NEGATIVE
Leukocytes, UA: NEGATIVE
Nitrite, UA: NEGATIVE
Protein, UA: NEGATIVE
Spec Grav, UA: 1.01 (ref 1.010–1.025)
Urobilinogen, UA: 0.2 E.U./dL
pH, UA: 5.5 (ref 5.0–8.0)

## 2023-04-08 MED ORDER — SUMATRIPTAN SUCCINATE 100 MG PO TABS: ORAL_TABLET | ORAL | 12 refills | Status: AC

## 2023-04-08 MED ORDER — NITROFURANTOIN MONOHYD MACRO 100 MG PO CAPS: 100.0000 mg | ORAL_CAPSULE | Freq: Two times a day (BID) | ORAL | 0 refills | Status: DC

## 2023-04-08 NOTE — Progress Notes (Signed)
62 y.o. [id number] Married White or Caucasian female here for annual exam.  Husband had a scare earlier this year due to having a sore throat that lasted for several months.  H. Pylori testing has just come back as positive.    She is finishing her letrozole in December.  We discussed repeating her bone density possibly next year once treatment has been completed for a few months.   Some urine odor.  No other symptoms right now.  Going out of town for wedding celebration.  Will give rx if starts having symptoms.  Patient's last menstrual period was 05/12/2012.          Sexually active: Yes.    The current method of family planning is post menopausal status.    Exercising: Yes.     walking Smoker:  no  Health Maintenance: Pap:  03/17/2022 Negative History of abnormal Pap:  no MMG:  10/08/2022 Negative Colonoscopy:  2022 with Dr. Matthias Hughs, follow up 10 years BMD:   10/17/2019 Screening Labs: 07/2022   reports that she has never smoked. She has never used smokeless tobacco. She reports current alcohol use of about 4.0 standard drinks of alcohol per week. She reports that she does not use drugs.  Past Medical History:  Diagnosis Date   Abnormal Pap smear    Breast cancer ([facility])    Elevated hemoglobin A1c June 2017   5.9   Family history of pancreatic cancer    GERD (gastroesophageal reflux disease)    Migraine    uses imitrex as needed   Osteopenia    Osteoporosis    Schatzki's ring    and hiatal hernia on EGD   Shingles rash 07/2015    shingles on right rib cage    TMJ (temporomandibular joint syndrome)    Vertigo     Past Surgical History:  Procedure Laterality Date   BREAST BIOPSY  1/12   fibrocystic changes    BREAST LUMPECTOMY WITH RADIOACTIVE SEED AND SENTINEL LYMPH NODE BIOPSY Left 04/22/2016   Procedure: BREAST LUMPECTOMY WITH RADIOACTIVE SEED AND SENTINEL LYMPH NODE BIOPSY;  Surgeon: Glenna Fellows, MD;  Location: [facility];  Service: General;   Laterality: Left;   DILATION AND CURETTAGE OF UTERUS  1991   ESOPHAGEAL DILATION     GYNECOLOGIC CRYOSURGERY  1980s   LAPAROSCOPIC CHOLECYSTECTOMY  1992   WISDOM TOOTH EXTRACTION      Current Outpatient Medications  Medication Sig Dispense Refill   acyclovir ointment (ZOVIRAX) 5 % Apply 1 Application topically every 3 (three) hours. 15 g 0   Calcium Citrate-Vitamin D (CALCIUM CITRATE + PO) Take by mouth.     Cholecalciferol (VITAMIN D PO) Take 2,000 Units by mouth every other day.      letrozole (FEMARA) 2.5 MG tablet TAKE 1 TABLET(2.5 MG) BY MOUTH DAILY 90 tablet 3   Magnesium 100 MG CAPS Take 2 capsules by mouth daily.     sertraline (ZOLOFT) 50 MG tablet TAKE 1 TABLET BY MOUTH EVERY DAY 30 tablet 2   traZODone (DESYREL) 100 MG tablet Take 1 tablet (100 mg total) by mouth at bedtime. 30 tablet 1   UNABLE TO FIND Med Name: tart cherry supplement     SUMAtriptan (IMITREX) 100 MG tablet May repeat in 2 hours if headache persists or recurs. 9 tablet 12   triamcinolone cream (KENALOG) 0.1 % Apply 1 Application topically 2 (two) times daily. (Patient not taking: Reported on 04/08/2023) 30 g 0   No current facility-administered  medications for this visit.    Family History  Problem Relation Age of Onset   Diabetes Father    Goiter Mother    Heart disease Other    Pancreatic cancer Brother 58       no genetic testing   Pancreatic cancer Maternal Grandfather        dx. early 14s   ALS Maternal Aunt    Heart Problems Maternal Grandmother    Breast cancer Other        dx. 66s; maternal second cousin   ALS Cousin     ROS: Constitutional: negative Genitourinary:negative  Exam:   BP 121/71 (BP Location: Right Arm, Patient Position: Sitting, Cuff Size: Large)   Pulse 69   Ht 5\' 5"  (1.651 m) Comment: Reported  Wt 149 lb 9.6 oz (67.9 kg)   LMP 05/12/2012   BMI 24.89 kg/m   Height: 5\' 5"  (165.1 cm) (Reported)  General appearance: alert, cooperative and appears stated age Head:  Normocephalic, without obvious abnormality, atraumatic Neck: no adenopathy, supple, symmetrical, trachea midline and thyroid normal to inspection and palpation Lungs: clear to auscultation bilaterally Breasts: normal appearance, no masses or tenderness, well healed left breast scars, no LAD noted Heart: regular rate and rhythm Abdomen: soft, non-tender; bowel sounds normal; no masses,  no organomegaly Extremities: extremities normal, atraumatic, no cyanosis or edema Skin: Skin color, texture, turgor normal. No rashes or lesions Lymph nodes: Cervical, supraclavicular, and axillary nodes normal. No abnormal inguinal nodes palpated Neurologic: Grossly normal  Pelvic: External genitalia:  no lesions              Urethra:  normal appearing urethra with no masses, tenderness or lesions              Bartholins and Skenes: normal                 Vagina: normal appearing vagina with normal color and no discharge, no lesions              Cervix: no lesions              Pap taken: No. Bimanual Exam:  Uterus:  normal size, contour, position, consistency, mobility, non-tender              Adnexa: normal adnexa and no mass, fullness, tenderness               Rectovaginal: Confirms               Anus:  normal sphincter tone, no lesions  Chaperone, Ina Homes, CMA, was present for exam.  Assessment/Plan: 1. Well woman exam with routine gynecological exam - Pap smear 03/17/2022 negative. Not indicated today. - Mammogram 10/09/2022 - Colonoscopy 2022 with Dr. Matthias Hughs - Bone mineral density discussed.  Will plan to repeat next year after complete of treatment with letrozole. - lab work done 07/2022 - vaccines reviewed/updated  2. Malignant neoplasm of upper-outer quadrant of left breast in female, estrogen receptor positive ([facility]) - will finish with letrozole as per above  3. Migraine with aura and without status migrainosus, not intractable - SUMAtriptan (IMITREX) 100 MG tablet; May repeat in 2  hours if headache persists or recurs.  Dispense: 9 tablet; Refill: 12  4. Abnormal urine odor - Urine Culture - nitrofurantoin, macrocrystal-monohydrate, (MACROBID) 100 MG capsule; Take 1 capsule (100 mg total) by mouth 2 (two) times daily.  Dispense: 14 capsule; Refill: 0 - POCT Urinalysis Dipstick  5. Osteopenia of both hips

## 2023-04-13 ENCOUNTER — Other Ambulatory Visit (HOSPITAL_BASED_OUTPATIENT_CLINIC_OR_DEPARTMENT_OTHER): Payer: Self-pay | Admitting: Obstetrics & Gynecology

## 2023-04-13 DIAGNOSIS — N3090 Cystitis, unspecified without hematuria: Secondary | ICD-10-CM

## 2023-04-13 LAB — URINE CULTURE

## 2023-04-13 MED ORDER — SULFAMETHOXAZOLE-TRIMETHOPRIM 800-160 MG PO TABS: 1.0000 | ORAL_TABLET | Freq: Two times a day (BID) | ORAL | 0 refills | Status: DC

## 2023-04-27 ENCOUNTER — Ambulatory Visit (INDEPENDENT_AMBULATORY_CARE_PROVIDER_SITE_OTHER): Payer: BC Managed Care – PPO

## 2023-04-27 ENCOUNTER — Encounter (HOSPITAL_BASED_OUTPATIENT_CLINIC_OR_DEPARTMENT_OTHER): Payer: Self-pay

## 2023-04-27 ENCOUNTER — Other Ambulatory Visit (HOSPITAL_BASED_OUTPATIENT_CLINIC_OR_DEPARTMENT_OTHER): Payer: Self-pay | Admitting: Obstetrics & Gynecology

## 2023-04-27 ENCOUNTER — Encounter (HOSPITAL_BASED_OUTPATIENT_CLINIC_OR_DEPARTMENT_OTHER): Payer: Self-pay | Admitting: Obstetrics & Gynecology

## 2023-04-27 VITALS — BP 132/65 | HR 66 | Ht 66.0 in | Wt 148.2 lb

## 2023-04-27 DIAGNOSIS — N3090 Cystitis, unspecified without hematuria: Secondary | ICD-10-CM

## 2023-04-27 DIAGNOSIS — R300 Dysuria: Secondary | ICD-10-CM | POA: Diagnosis not present

## 2023-04-27 LAB — POCT URINALYSIS DIPSTICK
Bilirubin, UA: NEGATIVE
Glucose, UA: NEGATIVE
Ketones, UA: NEGATIVE
Nitrite, UA: NEGATIVE
Protein, UA: NEGATIVE
Spec Grav, UA: 1.02 (ref 1.010–1.025)
Urobilinogen, UA: 0.2 E.U./dL
pH, UA: 7 (ref 5.0–8.0)

## 2023-04-27 MED ORDER — CEPHALEXIN 500 MG PO CAPS
500.0000 mg | ORAL_CAPSULE | Freq: Two times a day (BID) | ORAL | 0 refills | Status: AC
Start: 2023-04-27 — End: ?

## 2023-04-27 NOTE — Progress Notes (Signed)
Patient came in to have a urine sample checked. Patient is experiencing urinary urgency and pain while urinating. Urine was checked and sent for urine culture. Patient already had a rx for macrobid that was given to her at a previous appointment. Patient was advised that we will send urine off for culture and once the results are in we will call her if she needs something different. tbw

## 2023-05-01 LAB — URINE CULTURE

## 2023-05-02 NOTE — Addendum Note (Signed)
Addended by: Jerene Bears on: 05/02/2023 09:41 PM   Modules accepted: Orders

## 2023-05-04 ENCOUNTER — Other Ambulatory Visit (HOSPITAL_BASED_OUTPATIENT_CLINIC_OR_DEPARTMENT_OTHER): Payer: BC Managed Care – PPO

## 2023-05-04 DIAGNOSIS — N3090 Cystitis, unspecified without hematuria: Secondary | ICD-10-CM

## 2023-05-05 LAB — URINE CULTURE: Organism ID, Bacteria: NO GROWTH

## 2023-05-31 DIAGNOSIS — F411 Generalized anxiety disorder: Secondary | ICD-10-CM | POA: Diagnosis not present

## 2023-07-14 NOTE — Assessment & Plan Note (Deleted)
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 S/p left breast lumpectomy and adjuvant radiation. Oncotype RS showed low risk, 17, which predicts 10 year distant recurrence after 5 years of tamoxifen 11%. Chemotherapy was not recommended  -She started anti-estrogen therapy with letrozole in 07/2016. Due to Osteoporosis she was switched to Tamoxifen in 09/2018 but did not tolerate well due to insomnia and vaginal discharge. Switched back to Letrozole in 12/2018.  she will complete a total of 7 years therapy this month  -continue annual mammogram and f/u

## 2023-07-15 ENCOUNTER — Encounter (HOSPITAL_BASED_OUTPATIENT_CLINIC_OR_DEPARTMENT_OTHER): Payer: Self-pay | Admitting: Obstetrics & Gynecology

## 2023-07-15 ENCOUNTER — Telehealth (HOSPITAL_BASED_OUTPATIENT_CLINIC_OR_DEPARTMENT_OTHER): Payer: Self-pay | Admitting: *Deleted

## 2023-07-15 ENCOUNTER — Telehealth: Payer: Self-pay | Admitting: Hematology

## 2023-07-15 ENCOUNTER — Ambulatory Visit (HOSPITAL_BASED_OUTPATIENT_CLINIC_OR_DEPARTMENT_OTHER): Payer: BC Managed Care – PPO | Admitting: Obstetrics & Gynecology

## 2023-07-15 ENCOUNTER — Telehealth: Payer: Self-pay

## 2023-07-15 ENCOUNTER — Other Ambulatory Visit: Payer: Self-pay | Admitting: Hematology

## 2023-07-15 ENCOUNTER — Inpatient Hospital Stay: Payer: BC Managed Care – PPO

## 2023-07-15 ENCOUNTER — Inpatient Hospital Stay: Payer: BC Managed Care – PPO | Admitting: Hematology

## 2023-07-15 ENCOUNTER — Other Ambulatory Visit (HOSPITAL_COMMUNITY)
Admission: RE | Admit: 2023-07-15 | Discharge: 2023-07-15 | Disposition: A | Discharge status: Home / Self Care | Payer: BC Managed Care – PPO | Source: Ambulatory Visit | Attending: Obstetrics & Gynecology | Admitting: Obstetrics & Gynecology

## 2023-07-15 VITALS — BP 125/80 | HR 75 | Ht 66.0 in | Wt 152.4 lb

## 2023-07-15 DIAGNOSIS — B009 Herpesviral infection, unspecified: Secondary | ICD-10-CM

## 2023-07-15 DIAGNOSIS — N898 Other specified noninflammatory disorders of vagina: Secondary | ICD-10-CM | POA: Insufficient documentation

## 2023-07-15 DIAGNOSIS — Z87440 Personal history of urinary (tract) infections: Secondary | ICD-10-CM

## 2023-07-15 DIAGNOSIS — C50412 Malignant neoplasm of upper-outer quadrant of left female breast: Secondary | ICD-10-CM

## 2023-07-15 DIAGNOSIS — A601 Herpesviral infection of perianal skin and rectum: Secondary | ICD-10-CM | POA: Insufficient documentation

## 2023-07-15 DIAGNOSIS — L292 Pruritus vulvae: Secondary | ICD-10-CM | POA: Diagnosis not present

## 2023-07-15 DIAGNOSIS — Z113 Encounter for screening for infections with a predominantly sexual mode of transmission: Secondary | ICD-10-CM | POA: Insufficient documentation

## 2023-07-15 MED ORDER — VALACYCLOVIR HCL 500 MG PO TABS
500.0000 mg | ORAL_TABLET | Freq: Two times a day (BID) | ORAL | 2 refills | Status: AC
Start: 2023-07-15 — End: 2023-07-18

## 2023-07-15 MED ORDER — LIDOCAINE 5 % EX OINT: 1.0000 | TOPICAL_OINTMENT | Freq: Four times a day (QID) | CUTANEOUS | 1 refills | Status: DC | PRN

## 2023-07-15 MED ORDER — VALACYCLOVIR HCL 1 G PO TABS: 500.0000 mg | ORAL_TABLET | Freq: Two times a day (BID) | ORAL | 0 refills | Status: DC

## 2023-07-15 NOTE — Telephone Encounter (Signed)
Pt phone called was returned. Pt stated she had itching around her vaginal and anal area. She noticed small bumps around the anal area she never seen before. Pt was offered an appt to come in and be seen by Dr. Hyacinth Meeker 10/3.

## 2023-07-15 NOTE — Telephone Encounter (Signed)
Spoke with pt via telephone regarding appt/s scheduled for today 07/15/2023.  Stated that pt's appt with Dr. Mosetta Putt was at 11am and if the pt was still coming.  Pt stated she thought her appt was today at 1400 (2pm).  Pt apologized and requested to be rescheduled.  Informed pt that Erie Noe from Dr. Latanya Maudlin scheduling team will contact her to get her rescheduled.  Pt verbalized understanding and apologized again.

## 2023-07-15 NOTE — Telephone Encounter (Signed)
Patient called and would like eor nurse to call her stated she needs be seen today.

## 2023-07-15 NOTE — Progress Notes (Signed)
GYNECOLOGY  VISIT  CC:   vulvar itching, bumps  HPI: 62 y.o. [id number] Married White or Caucasian female here for complaints of vaginal and vulvar itching as well as some rectal lesions that she's noticed over the past few days.  She then thought she had a new hemorrhoid and used preparation H.  Then today, she's noticed some rectal lesions.  H/o HSV.  She did use some topical acyclovir ointment as well.  Denies bleeding.     Past Medical History:  Diagnosis Date   Abnormal Pap smear    Breast cancer (HCC)    Elevated hemoglobin A1c June 2017   5.9   Family history of pancreatic cancer    GERD (gastroesophageal reflux disease)    Migraine    uses imitrex as needed   Osteopenia    Osteoporosis    Schatzki's ring    and hiatal hernia on EGD   Shingles rash 07/2015    shingles on right rib cage    TMJ (temporomandibular joint syndrome)    Vertigo     MEDS:   Current Outpatient Medications on File Prior to Visit  Medication Sig Dispense Refill   acyclovir ointment (ZOVIRAX) 5 % Apply 1 Application topically every 3 (three) hours. 15 g 0   Calcium Citrate-Vitamin D (CALCIUM CITRATE + PO) Take by mouth.     Cholecalciferol (VITAMIN D PO) Take 2,000 Units by mouth every other day.      letrozole (FEMARA) 2.5 MG tablet TAKE 1 TABLET(2.5 MG) BY MOUTH DAILY 90 tablet 3   Magnesium 100 MG CAPS Take 2 capsules by mouth daily.     sertraline (ZOLOFT) 50 MG tablet TAKE 1 TABLET BY MOUTH EVERY DAY 30 tablet 2   SUMAtriptan (IMITREX) 100 MG tablet May repeat in 2 hours if headache persists or recurs. 9 tablet 12   triamcinolone cream (KENALOG) 0.1 % Apply 1 Application topically 2 (two) times daily. 30 g 0   traZODone (DESYREL) 100 MG tablet Take 1 tablet (100 mg total) by mouth at bedtime. (Patient not taking: Reported on 07/15/2023) 30 tablet 1   No current facility-administered medications on file prior to visit.    ALLERGIES: Ibuprofen, Oxycodone-acetaminophen, Tyloxapol, Zoledronic  acid, and Penicillins  SH:  married, non smoker  Review of Systems  Constitutional: Negative.     PHYSICAL EXAMINATION:    BP 125/80 (BP Location: Left Arm, Patient Position: Sitting, Cuff Size: Normal)   Pulse 75   Ht 5\' 6"  (1.676 m) Comment: Reported  Wt 152 lb 6.4 oz (69.1 kg)   LMP 05/12/2012   BMI 24.60 kg/m     General appearance: alert, cooperative and appears stated age Lymph:  no inguinal LAD noted  Pelvic: External genitalia:  no lesions              Urethra:  normal appearing urethra with no masses, tenderness or lesions              Bartholins and Skenes: normal                 Vagina: normal mucosa without prolapse or lesions              Anus:  multiple small ulcerations with erythematous base c/w HSV  Chaperone, Hendricks Milo, CMA, was present for exam.  Assessment/Plan: 1. HSV (herpes simplex virus) infection - will start valtrex now and rx for future also sent to pharmacy - valACYclovir (VALTREX) 500 MG tablet; Take 1 tablet (500  mg total) by mouth 2 (two) times daily for 3 days.  Dispense: 6 tablet; Refill: 2 - topical lidocaine prescribed for topical pain.  Offered gabapentin but she declined for now  2. History of UTI - will r/o UTI but am doubtful - Urine Culture  3. Vaginal itching - Cervicovaginal ancillary only( [place])

## 2023-07-16 ENCOUNTER — Other Ambulatory Visit: Payer: Self-pay

## 2023-07-16 DIAGNOSIS — Z170 Estrogen receptor positive status [ER+]: Secondary | ICD-10-CM

## 2023-07-17 LAB — URINE CULTURE

## 2023-07-18 ENCOUNTER — Other Ambulatory Visit: Payer: Self-pay | Admitting: Hematology

## 2023-07-18 NOTE — Assessment & Plan Note (Signed)
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 S/p left breast lumpectomy and adjuvant radiation. Oncotype RS showed low risk, 17, which predicts 10 year distant recurrence after 5 years of tamoxifen 11%. Chemotherapy was not recommended  -She started anti-estrogen therapy with letrozole in 07/2016. Due to Osteoporosis she was switched to Tamoxifen in 09/2018 but did not tolerate well due to insomnia and vaginal discharge. Switched back to Letrozole in 12/2018.  she will complete a total of 7 years therapy this month  -continue annual mammogram and f/u

## 2023-07-19 ENCOUNTER — Inpatient Hospital Stay: Payer: BC Managed Care – PPO | Attending: Hematology

## 2023-07-19 ENCOUNTER — Inpatient Hospital Stay (HOSPITAL_BASED_OUTPATIENT_CLINIC_OR_DEPARTMENT_OTHER): Payer: BC Managed Care – PPO | Admitting: Hematology

## 2023-07-19 ENCOUNTER — Encounter: Payer: Self-pay | Admitting: Hematology

## 2023-07-19 VITALS — BP 128/75 | HR 74 | Temp 98.9°F | Resp 15 | Ht 66.0 in | Wt 155.9 lb

## 2023-07-19 DIAGNOSIS — Z1231 Encounter for screening mammogram for malignant neoplasm of breast: Secondary | ICD-10-CM | POA: Diagnosis not present

## 2023-07-19 DIAGNOSIS — Z886 Allergy status to analgesic agent status: Secondary | ICD-10-CM | POA: Diagnosis not present

## 2023-07-19 DIAGNOSIS — R4189 Other symptoms and signs involving cognitive functions and awareness: Secondary | ICD-10-CM | POA: Diagnosis not present

## 2023-07-19 DIAGNOSIS — Z880 Allergy status to penicillin: Secondary | ICD-10-CM | POA: Diagnosis not present

## 2023-07-19 DIAGNOSIS — Z800 Family history of malignant neoplasm of digestive organs: Secondary | ICD-10-CM | POA: Diagnosis not present

## 2023-07-19 DIAGNOSIS — C50412 Malignant neoplasm of upper-outer quadrant of left female breast: Secondary | ICD-10-CM

## 2023-07-19 DIAGNOSIS — Z79811 Long term (current) use of aromatase inhibitors: Secondary | ICD-10-CM | POA: Insufficient documentation

## 2023-07-19 DIAGNOSIS — M8580 Other specified disorders of bone density and structure, unspecified site: Secondary | ICD-10-CM | POA: Insufficient documentation

## 2023-07-19 DIAGNOSIS — F419 Anxiety disorder, unspecified: Secondary | ICD-10-CM | POA: Insufficient documentation

## 2023-07-19 DIAGNOSIS — Z79899 Other long term (current) drug therapy: Secondary | ICD-10-CM | POA: Diagnosis not present

## 2023-07-19 DIAGNOSIS — Z9049 Acquired absence of other specified parts of digestive tract: Secondary | ICD-10-CM | POA: Insufficient documentation

## 2023-07-19 DIAGNOSIS — E2839 Other primary ovarian failure: Secondary | ICD-10-CM | POA: Diagnosis not present

## 2023-07-19 DIAGNOSIS — Z170 Estrogen receptor positive status [ER+]: Secondary | ICD-10-CM | POA: Diagnosis not present

## 2023-07-19 DIAGNOSIS — N898 Other specified noninflammatory disorders of vagina: Secondary | ICD-10-CM | POA: Insufficient documentation

## 2023-07-19 DIAGNOSIS — G4700 Insomnia, unspecified: Secondary | ICD-10-CM | POA: Insufficient documentation

## 2023-07-19 DIAGNOSIS — R5383 Other fatigue: Secondary | ICD-10-CM | POA: Diagnosis not present

## 2023-07-19 DIAGNOSIS — R519 Headache, unspecified: Secondary | ICD-10-CM | POA: Diagnosis not present

## 2023-07-19 LAB — CBC WITH DIFFERENTIAL (CANCER CENTER ONLY)
Abs Immature Granulocytes: 0.02 10*3/uL (ref 0.00–0.07)
Basophils Absolute: 0.1 10*3/uL (ref 0.0–0.1)
Basophils Relative: 1 %
Eosinophils Absolute: 0.4 10*3/uL (ref 0.0–0.5)
Eosinophils Relative: 5 %
HCT: 39.8 % (ref 36.0–46.0)
Hemoglobin: 13.3 g/dL (ref 12.0–15.0)
Immature Granulocytes: 0 %
Lymphocytes Relative: 32 %
Lymphs Abs: 2.9 10*3/uL (ref 0.7–4.0)
MCH: 32 pg (ref 26.0–34.0)
MCHC: 33.4 g/dL (ref 30.0–36.0)
MCV: 95.7 fL (ref 80.0–100.0)
Monocytes Absolute: 0.6 10*3/uL (ref 0.1–1.0)
Monocytes Relative: 6 %
Neutro Abs: 5 10*3/uL (ref 1.7–7.7)
Neutrophils Relative %: 56 %
Platelet Count: 271 10*3/uL (ref 150–400)
RBC: 4.16 MIL/uL (ref 3.87–5.11)
RDW: 12.3 % (ref 11.5–15.5)
WBC Count: 8.9 10*3/uL (ref 4.0–10.5)
nRBC: 0 % (ref 0.0–0.2)

## 2023-07-19 LAB — CMP (CANCER CENTER ONLY)
ALT: 14 U/L (ref 0–44)
AST: 15 U/L (ref 15–41)
Albumin: 4.6 g/dL (ref 3.5–5.0)
Alkaline Phosphatase: 55 U/L (ref 38–126)
Anion gap: 7 (ref 5–15)
BUN: 21 mg/dL (ref 8–23)
CO2: 26 mmol/L (ref 22–32)
Calcium: 10 mg/dL (ref 8.9–10.3)
Chloride: 103 mmol/L (ref 98–111)
Creatinine: 0.74 mg/dL (ref 0.44–1.00)
GFR, Estimated: >60 mL/min (ref >60)
Glucose, Bld: 110 mg/dL — ABNORMAL HIGH (ref 70–99)
Potassium: 4 mmol/L (ref 3.5–5.1)
Sodium: 136 mmol/L (ref 135–145)
Total Bilirubin: 0.4 mg/dL (ref 0.3–1.2)
Total Protein: 7.4 g/dL (ref 6.5–8.1)

## 2023-07-19 LAB — CERVICOVAGINAL ANCILLARY ONLY
Bacterial Vaginitis (gardnerella): NEGATIVE
Candida Glabrata: NEGATIVE
Candida Vaginitis: NEGATIVE
Comment: NEGATIVE
Comment: NEGATIVE
Comment: NEGATIVE

## 2023-07-19 NOTE — Progress Notes (Signed)
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 S/p left breast lumpectomy and adjuvant radiation. Oncotype RS showed low risk, 17, which predicts 10 year distant recurrence after 5 years of tamoxifen 11%. Chemotherapy was not recommended  -She started anti-estrogen therapy with letrozole in 07/2016. Due to Osteoporosis she was switched to Tamoxifen in 09/2018 but did not tolerate well due to insomnia and vaginal discharge. Switched back to Letrozole in 12/2018.  she will complete a total of 7 years therapy this month.  -continue annual mammogram and f/u     Assessment and Plan    Breast Cancer   Patient has been on Letrozole for 7 years with no new concerns. She reports some cognitive changes ("brain fog") which may be related to the medication.   -Discontinue Letrozole after current supply is finished.   -Monitor for improvement in cognitive symptoms after discontinuation of Letrozole.    Anxiety   Patient is currently on Sertraline for anxiety, which she started after initiating Letrozole. She expresses interest in discontinuing Sertraline after stopping Letrozole.   -Consider tapering Sertraline after discontinuation of Letrozole, over a period of 1-3 months. Patient to contact office when ready to start tapering process.    Osteopenia   Patient is  on Calcium and Vitamin D supplements when she remembers to take them. She is concerned about the impact of Letrozole on her bone health.   -Encourage consistent use of Calcium and Vitamin D supplements.   -Order bone density scan.    General Health Maintenance / Followup Plans   -Schedule mammogram in December.   -Annual follow-up visits for the next 3 years.        SUMMARY OF ONCOLOGIC HISTORY: Oncology History Overview Note  Breast cancer of upper-outer quadrant of left female breast (HCC)   Staging form: Breast, AJCC 7th Edition   - Clinical stage from 04/15/2016: Stage IA (T1b, N0, M0) - Unsigned         Staging comments: Staged at breast conference on 7.5.17   - Pathologic stage from 04/22/2016: Stage IA (T1c, N0, cM0) - Unsigned      Breast cancer of upper-outer quadrant of left female breast (HCC)  04/02/2016 Mammogram   1 cm oval massin the upper outer quadrant of left breast, suspicious for malignancy.    04/08/2016 Initial Diagnosis   Breast cancer of upper-outer quadrant of left female breast (HCC)   04/08/2016 Initial Biopsy   Left breast core needle biopsy showed invasive ductal carcinoma and DCIS, grade 1-2.   04/08/2016 Receptors her2   Your 100% positive, strong staining, PR 70% positive, strong staining, HER-2 negative, Ki-67 15%   04/22/2016 Surgery   Left breast lumpectomy and SLN biopsy ([place])   04/22/2016 Pathology Results   Left breast lumpectomy showed invasive ductal carcinoma, grade 1, 1.2 cm, low-grade DCIS, surgical margins were negative, 3 sentinel lymph nodes negative.   04/22/2016 Oncotype testing  RS 17, low risk, predicts 10 year distant recurrence risk of 11% with tamoxifen   05/26/2016 - 07/13/2016 Radiation Therapy   Adjuvant breast radiation [facility] Left Breast/ 50.4 Gy in 28 fx.  Boost/ 10 Gy in 5 fx   07/2016 -  Anti-estrogen oral therapy   Letrozole 2.5 mg daily. Planned duration of therapy: 5-10 years.  Switched to Tamoxifen from  09/2018-12/2018 due to osteporosis, but due to poor tolerance we switched her to Letrozole again in 12/2018.    08/27/2016 Imaging   DEXA scan: Osteopenia. (T-score -2.4).    11/22/2017 Pathology Results   Diagnosis 11/22/17 Breast, left, needle core biopsy - DENSE STROMAL FIBROSIS, CONSISTENT WITH PRIOR SURGICAL PROCEDURE. - THERE IS NO EVIDENCE OF MALIGNANCY. - SEE COMMENT.   05/24/2020 Genetic Testing   Negative genetic testing:  No pathogenic variants detected on the [facility] Common Hereditary Cancers Panel. The report date is 05/24/2020.   The Common Hereditary Cancers Panel offered by [facility] includes sequencing and/or deletion duplication testing of the following 48 genes: APC, ATM, AXIN2, BARD1, BMPR1A, BRCA1, BRCA2, BRIP1, CDH1, CDK4, CDKN2A (p14ARF), CDKN2A (p16INK4a), CHEK2, CTNNA1, DICER1, EPCAM (Deletion/duplication testing only), GREM1 (promoter region deletion/duplication testing only), KIT, MEN1, MLH1, MSH2, MSH3, MSH6, MUTYH, NBN, NF1, NTHL1, PALB2, PDGFRA, PMS2, POLD1, POLE, PTEN, RAD50, RAD51C, RAD51D, RNF43, SDHB, SDHC, SDHD, SMAD4, SMARCA4. STK11, TP53, TSC1, TSC2, and VHL.  The following genes were evaluated for sequence changes only: SDHA and HOXB13 c.251G>A variant only.      Discussed the use of AI scribe software for clinical note transcription with the patient, who gave verbal consent to proceed.  History of Present Illness   The patient, a 62 year old female with a history of breast cancer, presents for a routine follow-up. She has been on Letrozole for seven years and is due to stop this month. She expresses anticipation about stopping the medication, hoping that her 'brain fog' will improve. She describes this as forgetfulness and feeling slow, which she attributes to tiredness due to poor sleep. She denies any hot flashes or other problems from Letrozole.  She also mentions being on Sertraline for anxiety, which she started after experiencing a lot of anxiety for  other reasons. She is considering stopping this medication after stopping Letrozole, but is not in a hurry to do so.  The patient also reports having headaches, for which she takes Sumatriptan. She mentions having osteopenia and is due for a bone density scan and mammogram. She is also on Cyclafem for an unspecified condition. She denies any pain or other concerns about her breast or whole body.         All other systems were reviewed with the patient and are negative.  MEDICAL HISTORY:  Past Medical History:  Diagnosis Date   Abnormal Pap smear    Breast cancer (HCC)    Elevated hemoglobin A1c June 2017   5.9   Family history of pancreatic cancer    GERD (gastroesophageal reflux disease)    Migraine    uses imitrex as needed   Osteopenia    Osteoporosis    Schatzki's ring    and hiatal hernia on EGD   Shingles rash 07/2015    shingles on right rib cage    TMJ (temporomandibular joint syndrome)    Vertigo     SURGICAL HISTORY: Past Surgical History:  Procedure Laterality Date   BREAST BIOPSY  1/12   fibrocystic changes    BREAST LUMPECTOMY WITH RADIOACTIVE SEED AND SENTINEL LYMPH NODE BIOPSY  Left 04/22/2016   Procedure: BREAST LUMPECTOMY WITH RADIOACTIVE SEED AND SENTINEL LYMPH NODE BIOPSY;  Surgeon: Glenna Fellows, MD;  Location: [facility];  Service: General;  Laterality: Left;   DILATION AND CURETTAGE OF UTERUS  1991   ESOPHAGEAL DILATION     GYNECOLOGIC CRYOSURGERY  1980s   LAPAROSCOPIC CHOLECYSTECTOMY  1992   WISDOM TOOTH EXTRACTION      I have reviewed the social history and family history with the patient and they are unchanged from previous note.  ALLERGIES:  is allergic to ibuprofen, oxycodone-acetaminophen, tyloxapol, zoledronic acid, and penicillins.  MEDICATIONS:  Current Outpatient Medications  Medication Sig Dispense Refill   acyclovir ointment (ZOVIRAX) 5 % Apply 1 Application topically every 3 (three) hours. 15 g 0   Calcium  Citrate-Vitamin D (CALCIUM CITRATE + PO) Take by mouth.     Cholecalciferol (VITAMIN D PO) Take 2,000 Units by mouth every other day.      letrozole (FEMARA) 2.5 MG tablet TAKE 1 TABLET(2.5 MG) BY MOUTH DAILY 90 tablet 3   lidocaine (XYLOCAINE) 5 % ointment Apply 1 Application topically 4 (four) times daily as needed. 30 g 1   Magnesium 100 MG CAPS Take 2 capsules by mouth daily.     sertraline (ZOLOFT) 50 MG tablet TAKE 1 TABLET BY MOUTH EVERY DAY 30 tablet 2   SUMAtriptan (IMITREX) 100 MG tablet May repeat in 2 hours if headache persists or recurs. 9 tablet 12   triamcinolone cream (KENALOG) 0.1 % Apply 1 Application topically 2 (two) times daily. 30 g 0   valACYclovir (VALTREX) 1000 MG tablet Take 0.5 tablets (500 mg total) by mouth 2 (two) times daily. 10 tablet 0   No current facility-administered medications for this visit.    PHYSICAL EXAMINATION: ECOG PERFORMANCE STATUS: 0 - Asymptomatic  Vitals:   07/19/23 1356  BP: 128/75  Pulse: 74  Resp: 15  Temp: 98.9 F (37.2 C)  SpO2: 96%   Wt Readings from Last 3 Encounters:  07/19/23 155 lb 14.4 oz (70.7 kg)  07/15/23 152 lb 6.4 oz (69.1 kg)  04/27/23 148 lb 3.2 oz (67.2 kg)     GENERAL:alert, no distress and comfortable SKIN: skin color, texture, turgor are normal, no rashes or significant lesions EYES: normal, Conjunctiva are pink and non-injected, sclera clear NECK: supple, thyroid normal size, non-tender, without nodularity LYMPH:  no palpable lymphadenopathy in the cervical, axillary  LUNGS: clear to auscultation and percussion with normal breathing effort HEART: regular rate & rhythm and no murmurs and no lower extremity edema ABDOMEN:abdomen soft, non-tender and normal bowel sounds Musculoskeletal:no cyanosis of digits and no clubbing  NEURO: alert & oriented x 3 with fluent speech, no focal motor/sensory deficits BREAST: Normal breast tissue around nipple, slightly dense. Scar tissue in left breast at incision site,  not tender. No enlarged lymph nodes.      LABORATORY DATA:  I have reviewed the data as listed    Latest Ref Rng & Units 07/19/2023    1:36 PM 07/16/2022   10:18 AM 01/01/2022   12:51 PM  CBC  WBC 4.0 - 10.5 K/uL 8.9  5.8  5.9   Hemoglobin 12.0 - 15.0 g/dL 40.9  81.1  91.4   Hematocrit 36.0 - 46.0 % 39.8  39.0  40.5   Platelets 150 - 400 K/uL 271  267  282         Latest Ref Rng & Units 07/19/2023    1:36 PM 07/16/2022   10:18 AM  01/01/2022   12:51 PM  CMP  Glucose 70 - 99 mg/dL 098  119  93   BUN 8 - 23 mg/dL 21  17  19    Creatinine 0.44 - 1.00 mg/dL 1.47  8.29  5.62   Sodium 135 - 145 mmol/L 136  137  139   Potassium 3.5 - 5.1 mmol/L 4.0  4.5  3.8   Chloride 98 - 111 mmol/L 103  101  100   CO2 22 - 32 mmol/L 26  31  30    Calcium 8.9 - 10.3 mg/dL 13.0  9.7  86.5   Total Protein 6.5 - 8.1 g/dL 7.4  7.6    Total Bilirubin 0.3 - 1.2 mg/dL 0.4  0.5    Alkaline Phos 38 - 126 U/L 55  53    AST 15 - 41 U/L 15  18    ALT 0 - 44 U/L 14  16        RADIOGRAPHIC STUDIES: I have personally reviewed the radiological images as listed and agreed with the findings in the report. No results found.    Orders Placed This Encounter  Procedures   MM Digital Screening    Standing Status:   Future    Standing Expiration Date:   07/18/2024    Scheduling Instructions:     [facility]    Order Specific Question:   Reason for Exam (SYMPTOM  OR DIAGNOSIS REQUIRED)    Answer:   screening    Order Specific Question:   Preferred imaging location?    Answer:   External   DG BONE DENSITY (DXA)    Standing Status:   Future    Standing Expiration Date:   07/18/2024    Scheduling Instructions:     [facility]    Order Specific Question:   Reason for Exam (SYMPTOM  OR DIAGNOSIS REQUIRED)    Answer:   screening    Order Specific Question:   Preferred imaging location?    Answer:   External   All questions were answered. The patient knows to call the clinic with any problems, questions or concerns. No  barriers to learning was detected. The total time spent in the appointment was 20 minutes.     Malachy Mood, MD 07/19/2023

## 2023-08-05 ENCOUNTER — Other Ambulatory Visit: Payer: Self-pay

## 2023-08-25 ENCOUNTER — Encounter (HOSPITAL_COMMUNITY): Payer: Self-pay | Admitting: Emergency Medicine

## 2023-08-25 ENCOUNTER — Other Ambulatory Visit: Payer: Self-pay

## 2023-08-25 ENCOUNTER — Inpatient Hospital Stay (HOSPITAL_COMMUNITY)
Admission: EM | Admit: 2023-08-25 | Discharge: 2023-08-31 | DRG: 438 | Disposition: A | Discharge status: Home / Self Care | Payer: BC Managed Care – PPO | Attending: Internal Medicine | Admitting: Internal Medicine

## 2023-08-25 DIAGNOSIS — Z79811 Long term (current) use of aromatase inhibitors: Secondary | ICD-10-CM

## 2023-08-25 DIAGNOSIS — K859 Acute pancreatitis without necrosis or infection, unspecified: Principal | ICD-10-CM | POA: Diagnosis present

## 2023-08-25 DIAGNOSIS — K2100 Gastro-esophageal reflux disease with esophagitis, without bleeding: Secondary | ICD-10-CM | POA: Diagnosis not present

## 2023-08-25 DIAGNOSIS — K851 Biliary acute pancreatitis without necrosis or infection: Secondary | ICD-10-CM | POA: Diagnosis not present

## 2023-08-25 DIAGNOSIS — R0602 Shortness of breath: Secondary | ICD-10-CM | POA: Diagnosis not present

## 2023-08-25 DIAGNOSIS — R7401 Elevation of levels of liver transaminase levels: Secondary | ICD-10-CM | POA: Diagnosis present

## 2023-08-25 DIAGNOSIS — K2970 Gastritis, unspecified, without bleeding: Secondary | ICD-10-CM | POA: Diagnosis not present

## 2023-08-25 DIAGNOSIS — R609 Edema, unspecified: Secondary | ICD-10-CM | POA: Diagnosis not present

## 2023-08-25 DIAGNOSIS — Z888 Allergy status to other drugs, medicaments and biological substances status: Secondary | ICD-10-CM | POA: Diagnosis not present

## 2023-08-25 DIAGNOSIS — Z853 Personal history of malignant neoplasm of breast: Secondary | ICD-10-CM | POA: Diagnosis not present

## 2023-08-25 DIAGNOSIS — K263 Acute duodenal ulcer without hemorrhage or perforation: Secondary | ICD-10-CM | POA: Diagnosis not present

## 2023-08-25 DIAGNOSIS — Z800 Family history of malignant neoplasm of digestive organs: Secondary | ICD-10-CM

## 2023-08-25 DIAGNOSIS — K2930 Chronic superficial gastritis without bleeding: Secondary | ICD-10-CM | POA: Diagnosis present

## 2023-08-25 DIAGNOSIS — K5712 Diverticulitis of small intestine without perforation or abscess without bleeding: Secondary | ICD-10-CM | POA: Diagnosis present

## 2023-08-25 DIAGNOSIS — K319 Disease of stomach and duodenum, unspecified: Secondary | ICD-10-CM | POA: Diagnosis not present

## 2023-08-25 DIAGNOSIS — R231 Pallor: Secondary | ICD-10-CM | POA: Diagnosis not present

## 2023-08-25 DIAGNOSIS — W449XXA Unspecified foreign body entering into or through a natural orifice, initial encounter: Secondary | ICD-10-CM | POA: Diagnosis present

## 2023-08-25 DIAGNOSIS — K831 Obstruction of bile duct: Secondary | ICD-10-CM | POA: Diagnosis not present

## 2023-08-25 DIAGNOSIS — R0600 Dyspnea, unspecified: Secondary | ICD-10-CM | POA: Diagnosis present

## 2023-08-25 DIAGNOSIS — Y929 Unspecified place or not applicable: Secondary | ICD-10-CM

## 2023-08-25 DIAGNOSIS — K224 Dyskinesia of esophagus: Secondary | ICD-10-CM | POA: Diagnosis present

## 2023-08-25 DIAGNOSIS — Z833 Family history of diabetes mellitus: Secondary | ICD-10-CM

## 2023-08-25 DIAGNOSIS — K222 Esophageal obstruction: Secondary | ICD-10-CM | POA: Diagnosis present

## 2023-08-25 DIAGNOSIS — R918 Other nonspecific abnormal finding of lung field: Secondary | ICD-10-CM | POA: Diagnosis not present

## 2023-08-25 DIAGNOSIS — R1010 Upper abdominal pain, unspecified: Secondary | ICD-10-CM | POA: Diagnosis not present

## 2023-08-25 DIAGNOSIS — K200 Eosinophilic esophagitis: Secondary | ICD-10-CM | POA: Diagnosis not present

## 2023-08-25 DIAGNOSIS — R945 Abnormal results of liver function studies: Secondary | ICD-10-CM | POA: Diagnosis not present

## 2023-08-25 DIAGNOSIS — T183XXA Foreign body in small intestine, initial encounter: Secondary | ICD-10-CM | POA: Diagnosis not present

## 2023-08-25 DIAGNOSIS — K449 Diaphragmatic hernia without obstruction or gangrene: Secondary | ICD-10-CM | POA: Diagnosis not present

## 2023-08-25 DIAGNOSIS — Z880 Allergy status to penicillin: Secondary | ICD-10-CM | POA: Diagnosis not present

## 2023-08-25 DIAGNOSIS — K5710 Diverticulosis of small intestine without perforation or abscess without bleeding: Secondary | ICD-10-CM | POA: Diagnosis not present

## 2023-08-25 DIAGNOSIS — Z885 Allergy status to narcotic agent status: Secondary | ICD-10-CM

## 2023-08-25 DIAGNOSIS — F419 Anxiety disorder, unspecified: Secondary | ICD-10-CM | POA: Diagnosis present

## 2023-08-25 DIAGNOSIS — K5900 Constipation, unspecified: Secondary | ICD-10-CM | POA: Diagnosis present

## 2023-08-25 DIAGNOSIS — E876 Hypokalemia: Secondary | ICD-10-CM | POA: Diagnosis present

## 2023-08-25 DIAGNOSIS — R0902 Hypoxemia: Secondary | ICD-10-CM | POA: Diagnosis not present

## 2023-08-25 DIAGNOSIS — I10 Essential (primary) hypertension: Secondary | ICD-10-CM | POA: Diagnosis not present

## 2023-08-25 DIAGNOSIS — R110 Nausea: Secondary | ICD-10-CM | POA: Diagnosis not present

## 2023-08-25 DIAGNOSIS — K567 Ileus, unspecified: Secondary | ICD-10-CM | POA: Diagnosis not present

## 2023-08-25 DIAGNOSIS — K838 Other specified diseases of biliary tract: Secondary | ICD-10-CM | POA: Diagnosis not present

## 2023-08-25 DIAGNOSIS — D72829 Elevated white blood cell count, unspecified: Secondary | ICD-10-CM | POA: Diagnosis present

## 2023-08-25 DIAGNOSIS — R1013 Epigastric pain: Secondary | ICD-10-CM | POA: Diagnosis not present

## 2023-08-25 DIAGNOSIS — D649 Anemia, unspecified: Secondary | ICD-10-CM | POA: Diagnosis not present

## 2023-08-25 DIAGNOSIS — K5730 Diverticulosis of large intestine without perforation or abscess without bleeding: Secondary | ICD-10-CM | POA: Diagnosis not present

## 2023-08-25 DIAGNOSIS — J90 Pleural effusion, not elsewhere classified: Secondary | ICD-10-CM | POA: Diagnosis not present

## 2023-08-25 DIAGNOSIS — R933 Abnormal findings on diagnostic imaging of other parts of digestive tract: Secondary | ICD-10-CM | POA: Diagnosis not present

## 2023-08-25 DIAGNOSIS — R1111 Vomiting without nausea: Secondary | ICD-10-CM | POA: Diagnosis not present

## 2023-08-25 DIAGNOSIS — K2950 Unspecified chronic gastritis without bleeding: Secondary | ICD-10-CM | POA: Diagnosis not present

## 2023-08-25 DIAGNOSIS — Z79899 Other long term (current) drug therapy: Secondary | ICD-10-CM | POA: Diagnosis not present

## 2023-08-25 DIAGNOSIS — R1084 Generalized abdominal pain: Secondary | ICD-10-CM | POA: Diagnosis not present

## 2023-08-25 DIAGNOSIS — K2980 Duodenitis without bleeding: Secondary | ICD-10-CM | POA: Diagnosis not present

## 2023-08-25 NOTE — ED Triage Notes (Signed)
Pt BIB [facility] from home. At 1800 pt started to feel "unwell". Around 2230 pt began having upper epigastric pain radiating to her central chest. EMS gave 324mg  Asprin,1 Nitroglycerin and pain went to 4/10. Pt did have 1 episode of vomiting in route; 4mg  Zofran given.

## 2023-08-26 ENCOUNTER — Emergency Department (HOSPITAL_COMMUNITY): Payer: BC Managed Care – PPO

## 2023-08-26 DIAGNOSIS — J90 Pleural effusion, not elsewhere classified: Secondary | ICD-10-CM | POA: Diagnosis not present

## 2023-08-26 DIAGNOSIS — D649 Anemia, unspecified: Secondary | ICD-10-CM | POA: Diagnosis present

## 2023-08-26 DIAGNOSIS — K859 Acute pancreatitis without necrosis or infection, unspecified: Secondary | ICD-10-CM

## 2023-08-26 DIAGNOSIS — R918 Other nonspecific abnormal finding of lung field: Secondary | ICD-10-CM | POA: Diagnosis not present

## 2023-08-26 DIAGNOSIS — K5710 Diverticulosis of small intestine without perforation or abscess without bleeding: Secondary | ICD-10-CM

## 2023-08-26 DIAGNOSIS — K567 Ileus, unspecified: Secondary | ICD-10-CM | POA: Diagnosis not present

## 2023-08-26 DIAGNOSIS — K851 Biliary acute pancreatitis without necrosis or infection: Secondary | ICD-10-CM | POA: Diagnosis not present

## 2023-08-26 DIAGNOSIS — F419 Anxiety disorder, unspecified: Secondary | ICD-10-CM | POA: Diagnosis present

## 2023-08-26 DIAGNOSIS — Z79899 Other long term (current) drug therapy: Secondary | ICD-10-CM | POA: Diagnosis not present

## 2023-08-26 DIAGNOSIS — E876 Hypokalemia: Secondary | ICD-10-CM | POA: Diagnosis present

## 2023-08-26 DIAGNOSIS — K200 Eosinophilic esophagitis: Secondary | ICD-10-CM | POA: Diagnosis not present

## 2023-08-26 DIAGNOSIS — R0902 Hypoxemia: Secondary | ICD-10-CM | POA: Diagnosis present

## 2023-08-26 DIAGNOSIS — K2100 Gastro-esophageal reflux disease with esophagitis, without bleeding: Secondary | ICD-10-CM

## 2023-08-26 DIAGNOSIS — K831 Obstruction of bile duct: Secondary | ICD-10-CM | POA: Diagnosis not present

## 2023-08-26 DIAGNOSIS — R1010 Upper abdominal pain, unspecified: Secondary | ICD-10-CM | POA: Diagnosis not present

## 2023-08-26 DIAGNOSIS — K5900 Constipation, unspecified: Secondary | ICD-10-CM | POA: Diagnosis present

## 2023-08-26 DIAGNOSIS — R7401 Elevation of levels of liver transaminase levels: Secondary | ICD-10-CM | POA: Diagnosis not present

## 2023-08-26 DIAGNOSIS — D72829 Elevated white blood cell count, unspecified: Secondary | ICD-10-CM | POA: Diagnosis not present

## 2023-08-26 DIAGNOSIS — R933 Abnormal findings on diagnostic imaging of other parts of digestive tract: Secondary | ICD-10-CM | POA: Diagnosis not present

## 2023-08-26 DIAGNOSIS — K263 Acute duodenal ulcer without hemorrhage or perforation: Secondary | ICD-10-CM | POA: Diagnosis not present

## 2023-08-26 DIAGNOSIS — Y929 Unspecified place or not applicable: Secondary | ICD-10-CM | POA: Diagnosis not present

## 2023-08-26 DIAGNOSIS — Z885 Allergy status to narcotic agent status: Secondary | ICD-10-CM | POA: Diagnosis not present

## 2023-08-26 DIAGNOSIS — K2950 Unspecified chronic gastritis without bleeding: Secondary | ICD-10-CM | POA: Diagnosis not present

## 2023-08-26 DIAGNOSIS — T183XXA Foreign body in small intestine, initial encounter: Secondary | ICD-10-CM | POA: Diagnosis not present

## 2023-08-26 DIAGNOSIS — K5730 Diverticulosis of large intestine without perforation or abscess without bleeding: Secondary | ICD-10-CM | POA: Diagnosis not present

## 2023-08-26 DIAGNOSIS — R1013 Epigastric pain: Secondary | ICD-10-CM | POA: Diagnosis not present

## 2023-08-26 DIAGNOSIS — K2980 Duodenitis without bleeding: Secondary | ICD-10-CM | POA: Diagnosis not present

## 2023-08-26 DIAGNOSIS — K319 Disease of stomach and duodenum, unspecified: Secondary | ICD-10-CM | POA: Diagnosis not present

## 2023-08-26 DIAGNOSIS — W449XXA Unspecified foreign body entering into or through a natural orifice, initial encounter: Secondary | ICD-10-CM | POA: Diagnosis present

## 2023-08-26 DIAGNOSIS — K222 Esophageal obstruction: Secondary | ICD-10-CM | POA: Diagnosis present

## 2023-08-26 DIAGNOSIS — R0602 Shortness of breath: Secondary | ICD-10-CM | POA: Diagnosis not present

## 2023-08-26 DIAGNOSIS — K5712 Diverticulitis of small intestine without perforation or abscess without bleeding: Secondary | ICD-10-CM | POA: Diagnosis present

## 2023-08-26 DIAGNOSIS — Z800 Family history of malignant neoplasm of digestive organs: Secondary | ICD-10-CM | POA: Diagnosis not present

## 2023-08-26 DIAGNOSIS — Z853 Personal history of malignant neoplasm of breast: Secondary | ICD-10-CM | POA: Diagnosis not present

## 2023-08-26 DIAGNOSIS — Z880 Allergy status to penicillin: Secondary | ICD-10-CM | POA: Diagnosis not present

## 2023-08-26 DIAGNOSIS — Z888 Allergy status to other drugs, medicaments and biological substances status: Secondary | ICD-10-CM | POA: Diagnosis not present

## 2023-08-26 DIAGNOSIS — R609 Edema, unspecified: Secondary | ICD-10-CM | POA: Diagnosis not present

## 2023-08-26 DIAGNOSIS — K2930 Chronic superficial gastritis without bleeding: Secondary | ICD-10-CM | POA: Diagnosis present

## 2023-08-26 DIAGNOSIS — Z833 Family history of diabetes mellitus: Secondary | ICD-10-CM | POA: Diagnosis not present

## 2023-08-26 DIAGNOSIS — K449 Diaphragmatic hernia without obstruction or gangrene: Secondary | ICD-10-CM | POA: Diagnosis present

## 2023-08-26 DIAGNOSIS — K224 Dyskinesia of esophagus: Secondary | ICD-10-CM | POA: Diagnosis present

## 2023-08-26 DIAGNOSIS — R945 Abnormal results of liver function studies: Secondary | ICD-10-CM | POA: Diagnosis not present

## 2023-08-26 DIAGNOSIS — K838 Other specified diseases of biliary tract: Secondary | ICD-10-CM | POA: Diagnosis not present

## 2023-08-26 DIAGNOSIS — R231 Pallor: Secondary | ICD-10-CM | POA: Diagnosis not present

## 2023-08-26 DIAGNOSIS — Z79811 Long term (current) use of aromatase inhibitors: Secondary | ICD-10-CM | POA: Diagnosis not present

## 2023-08-26 DIAGNOSIS — R0600 Dyspnea, unspecified: Secondary | ICD-10-CM | POA: Diagnosis present

## 2023-08-26 DIAGNOSIS — K2970 Gastritis, unspecified, without bleeding: Secondary | ICD-10-CM | POA: Diagnosis not present

## 2023-08-26 LAB — LIPID PANEL
Cholesterol: 194 mg/dL (ref 0–200)
HDL: 75 mg/dL (ref >40)
LDL Cholesterol: 110 mg/dL — ABNORMAL HIGH (ref 0–99)
Total CHOL/HDL Ratio: 2.6 {ratio}
Triglycerides: 47 mg/dL (ref <150)
VLDL: 9 mg/dL (ref 0–40)

## 2023-08-26 LAB — CBC WITH DIFFERENTIAL/PLATELET
Abs Immature Granulocytes: 0.04 10*3/uL (ref 0.00–0.07)
Basophils Absolute: 0.1 10*3/uL (ref 0.0–0.1)
Basophils Relative: 1 %
Eosinophils Absolute: 0.4 10*3/uL (ref 0.0–0.5)
Eosinophils Relative: 3 %
HCT: 35.9 % — ABNORMAL LOW (ref 36.0–46.0)
Hemoglobin: 12 g/dL (ref 12.0–15.0)
Immature Granulocytes: 0 %
Lymphocytes Relative: 13 %
Lymphs Abs: 1.7 10*3/uL (ref 0.7–4.0)
MCH: 31.7 pg (ref 26.0–34.0)
MCHC: 33.4 g/dL (ref 30.0–36.0)
MCV: 94.7 fL (ref 80.0–100.0)
Monocytes Absolute: 0.8 10*3/uL (ref 0.1–1.0)
Monocytes Relative: 7 %
Neutro Abs: 9.8 10*3/uL — ABNORMAL HIGH (ref 1.7–7.7)
Neutrophils Relative %: 76 %
Platelets: 285 10*3/uL (ref 150–400)
RBC: 3.79 MIL/uL — ABNORMAL LOW (ref 3.87–5.11)
RDW: 12.7 % (ref 11.5–15.5)
WBC: 12.7 10*3/uL — ABNORMAL HIGH (ref 4.0–10.5)
nRBC: 0 % (ref 0.0–0.2)

## 2023-08-26 LAB — COMPREHENSIVE METABOLIC PANEL
ALT: 288 U/L — ABNORMAL HIGH (ref 0–44)
AST: 516 U/L — ABNORMAL HIGH (ref 15–41)
Albumin: 3.9 g/dL (ref 3.5–5.0)
Alkaline Phosphatase: 127 U/L — ABNORMAL HIGH (ref 38–126)
Anion gap: 9 (ref 5–15)
BUN: 13 mg/dL (ref 8–23)
CO2: 24 mmol/L (ref 22–32)
Calcium: 9.4 mg/dL (ref 8.9–10.3)
Chloride: 102 mmol/L (ref 98–111)
Creatinine, Ser: 0.83 mg/dL (ref 0.44–1.00)
GFR, Estimated: >60 mL/min (ref >60)
Glucose, Bld: 154 mg/dL — ABNORMAL HIGH (ref 70–99)
Potassium: 3.7 mmol/L (ref 3.5–5.1)
Sodium: 135 mmol/L (ref 135–145)
Total Bilirubin: 0.9 mg/dL (ref <1.2)
Total Protein: 6.7 g/dL (ref 6.5–8.1)

## 2023-08-26 LAB — ACETAMINOPHEN LEVEL: Acetaminophen (Tylenol), Serum: <10 ug/mL — ABNORMAL LOW (ref 10–30)

## 2023-08-26 LAB — HEPATIC FUNCTION PANEL
ALT: 418 U/L — ABNORMAL HIGH (ref 0–44)
AST: 358 U/L — ABNORMAL HIGH (ref 15–41)
Albumin: 3.5 g/dL (ref 3.5–5.0)
Alkaline Phosphatase: 124 U/L (ref 38–126)
Bilirubin, Direct: <0.1 mg/dL (ref 0.0–0.2)
Total Bilirubin: 0.4 mg/dL (ref <1.2)
Total Protein: 6.4 g/dL — ABNORMAL LOW (ref 6.5–8.1)

## 2023-08-26 LAB — LIPASE, BLOOD: Lipase: 1725 U/L — ABNORMAL HIGH (ref 11–51)

## 2023-08-26 LAB — HIV ANTIBODY (ROUTINE TESTING W REFLEX): HIV Screen 4th Generation wRfx: NONREACTIVE

## 2023-08-26 LAB — TROPONIN I (HIGH SENSITIVITY)
Troponin I (High Sensitivity): 3 ng/L (ref <18)
Troponin I (High Sensitivity): 4 ng/L (ref <18)

## 2023-08-26 LAB — HEPATITIS PANEL, ACUTE
HCV Ab: NONREACTIVE
Hep A IgM: NONREACTIVE
Hep B C IgM: NONREACTIVE
Hepatitis B Surface Ag: NONREACTIVE

## 2023-08-26 LAB — LACTIC ACID, PLASMA: Lactic Acid, Venous: 2.1 mmol/L (ref 0.5–1.9)

## 2023-08-26 MED ORDER — IOHEXOL 300 MG/ML  SOLN
75.0000 mL | Freq: Once | INTRAMUSCULAR | Status: AC | PRN
Start: 2023-08-26 — End: 2023-08-26
  Administered 2023-08-26: 75 mL via INTRAVENOUS

## 2023-08-26 MED ORDER — ENOXAPARIN SODIUM 40 MG/0.4ML IJ SOSY
40.0000 mg | PREFILLED_SYRINGE | Freq: Every day | INTRAMUSCULAR | Status: DC
  Administered 2023-08-28 – 2023-08-31 (×4): 40 mg via SUBCUTANEOUS
  Filled 2023-08-26 (×5): qty 0.4

## 2023-08-26 MED ORDER — ALBUTEROL SULFATE (2.5 MG/3ML) 0.083% IN NEBU: 2.5000 mg | INHALATION_SOLUTION | Freq: Four times a day (QID) | RESPIRATORY_TRACT | Status: DC | PRN

## 2023-08-26 MED ORDER — ALUM & MAG HYDROXIDE-SIMETH 200-200-20 MG/5ML PO SUSP
30.0000 mL | Freq: Once | ORAL | Status: AC
  Administered 2023-08-26: 30 mL via ORAL
  Filled 2023-08-26: qty 30

## 2023-08-26 MED ORDER — SERTRALINE HCL 25 MG PO TABS
50.0000 mg | ORAL_TABLET | Freq: Every day | ORAL | Status: DC
  Administered 2023-08-26 – 2023-08-31 (×6): 50 mg via ORAL
  Filled 2023-08-26 (×6): qty 2

## 2023-08-26 MED ORDER — SODIUM CHLORIDE 0.9 % IV BOLUS
1000.0000 mL | Freq: Once | INTRAVENOUS | Status: AC
  Administered 2023-08-26: 1000 mL via INTRAVENOUS

## 2023-08-26 MED ORDER — FENTANYL CITRATE PF 50 MCG/ML IJ SOSY
25.0000 ug | PREFILLED_SYRINGE | INTRAMUSCULAR | Status: DC | PRN
  Administered 2023-08-26 – 2023-08-27 (×3): 50 ug via INTRAVENOUS
  Filled 2023-08-26 (×3): qty 1

## 2023-08-26 MED ORDER — SODIUM CHLORIDE 0.9% FLUSH
3.0000 mL | Freq: Two times a day (BID) | INTRAVENOUS | Status: DC
  Administered 2023-08-26 – 2023-08-31 (×11): 3 mL via INTRAVENOUS

## 2023-08-26 MED ORDER — HYDRALAZINE HCL 20 MG/ML IJ SOLN: 10.0000 mg | INTRAMUSCULAR | Status: DC | PRN

## 2023-08-26 MED ORDER — PANTOPRAZOLE SODIUM 40 MG PO TBEC
40.0000 mg | DELAYED_RELEASE_TABLET | Freq: Every day | ORAL | Status: DC
Start: 2023-08-26 — End: 2023-08-27
  Administered 2023-08-26: 40 mg via ORAL
  Filled 2023-08-26: qty 1

## 2023-08-26 MED ORDER — ONDANSETRON HCL 4 MG PO TABS
4.0000 mg | ORAL_TABLET | Freq: Four times a day (QID) | ORAL | Status: DC | PRN
  Administered 2023-08-27: 4 mg via ORAL
  Filled 2023-08-26: qty 1

## 2023-08-26 MED ORDER — LIDOCAINE VISCOUS HCL 2 % MT SOLN
15.0000 mL | Freq: Once | OROMUCOSAL | Status: AC
  Administered 2023-08-26: 15 mL via ORAL
  Filled 2023-08-26: qty 15

## 2023-08-26 MED ORDER — LACTATED RINGERS IV SOLN
INTRAVENOUS | Status: DC
Start: 2023-08-26 — End: 2023-08-27

## 2023-08-26 MED ORDER — TRAMADOL HCL 50 MG PO TABS
50.0000 mg | ORAL_TABLET | Freq: Four times a day (QID) | ORAL | Status: DC | PRN
  Administered 2023-08-27 – 2023-08-28 (×3): 50 mg via ORAL
  Filled 2023-08-26 (×3): qty 1

## 2023-08-26 MED ORDER — ONDANSETRON HCL 4 MG/2ML IJ SOLN
4.0000 mg | Freq: Four times a day (QID) | INTRAMUSCULAR | Status: DC | PRN
  Administered 2023-08-30: 4 mg via INTRAVENOUS
  Filled 2023-08-26: qty 2

## 2023-08-26 MED ORDER — FENTANYL CITRATE PF 50 MCG/ML IJ SOSY
50.0000 ug | PREFILLED_SYRINGE | Freq: Once | INTRAMUSCULAR | Status: AC
  Administered 2023-08-26: 50 ug via INTRAVENOUS
  Filled 2023-08-26: qty 1

## 2023-08-26 NOTE — H&P (Addendum)
History and Physical    Patient: Tiffany Nash ZOX:[id number] DOB: [birth year] DOA: 08/25/2023 DOS: the patient was seen and examined on 08/26/2023 PCP: Darrow Bussing, MD  Patient coming from: Home via EMS  Chief Complaint:  Chief Complaint  Patient presents with   Abdominal Pain   HPI: Everleigh' E Zuniga is a 62 y.o. female with medical history significant of migraine headaches, breast cancer, arthritis, Schatzki's ring, hiatal hernia, and GERD who presents with complaints of abdominal pain which started yesterday evening.  She had had a late lunch eating [place] yesterday afternoon with 1 beer then for dinner she had 2 baby Bell cheeses with water and took a Pepcid as she normally does prior to going to bed.  Within 15 to 20 minutes patient reported having acute onset of epigastric abdominal pain.  Noted associated symptoms of diaphoresis.  Pain was sharp and radiated into her chest.  Patient not having any significant fever, chills, cough, or shortness of breath symptoms.  Patient reports that she normally drinks a couple beers per week, but not on a daily basis.  Family history is significant for pancreatic cancer in her brother and grandfather.  Records note patient had barium swallow back in 2021 ordered by Dr. Matthias Hughs which noted small hiatal hernia, evidence of mild reflux esophagitis, no significant esophageal stricture, and mild-to-moderate esophageal dysmotility.  In the emergency department patient was noted to be afebrile with pulse 56-65, respirations 13-22, and all other vital signs maintained.  Labs significant for WBC 12.7, glucose 154, alkaline phosphatase 127, lipase 1725, AST 516, ALT 288, total bilirubin 0.9, and high-sensitivity troponins negative x 2.  Chest x-ray noted no acute abnormality.  CT scan of the abdomen pelvis with contrast significant for  large duodenal diverticulum extending off the medial aspect of the second portion of the duodenal exerting local mass effect  that may narrow the distal common bile duct,  and is associated with some mild intra/extrahepatic biliary ductal dilatation. Patient was given 1 L bolus of IV fluids, fentanyl 50 mg, and GI cocktail.    Review of Systems: As mentioned in the history of present illness. All other systems reviewed and are negative. Past Medical History:  Diagnosis Date   Abnormal Pap smear    Breast cancer (HCC)    Elevated hemoglobin A1c June 2017   5.9   Family history of pancreatic cancer    GERD (gastroesophageal reflux disease)    Migraine    uses imitrex as needed   Osteopenia    Osteoporosis    Schatzki's ring    and hiatal hernia on EGD   Shingles rash 07/2015    shingles on right rib cage    TMJ (temporomandibular joint syndrome)    Vertigo    Past Surgical History:  Procedure Laterality Date   BREAST BIOPSY  1/12   fibrocystic changes    BREAST LUMPECTOMY WITH RADIOACTIVE SEED AND SENTINEL LYMPH NODE BIOPSY Left 04/22/2016   Procedure: BREAST LUMPECTOMY WITH RADIOACTIVE SEED AND SENTINEL LYMPH NODE BIOPSY;  Surgeon: Glenna Fellows, MD;  Location: [facility];  Service: General;  Laterality: Left;   DILATION AND CURETTAGE OF UTERUS  1991   ESOPHAGEAL DILATION     GYNECOLOGIC CRYOSURGERY  1980s   LAPAROSCOPIC CHOLECYSTECTOMY  1992   WISDOM TOOTH EXTRACTION     Social History:  reports that she has never smoked. She has never used smokeless tobacco. She reports current alcohol use of about 4.0 standard drinks of alcohol per  week. She reports that she does not use drugs.  Allergies  Allergen Reactions   Ibuprofen Other (See Comments)   Oxycodone-Acetaminophen Other (See Comments)    Tachycardia   Tyloxapol Other (See Comments)   Zoledronic Acid Other (See Comments)   Penicillins Rash    Yeast infection    Family History  Problem Relation Age of Onset   Diabetes Father    Goiter Mother    Heart disease Other    Pancreatic cancer Brother 34       no genetic  testing   Pancreatic cancer Maternal Grandfather        dx. early 64s   ALS Maternal Aunt    Heart Problems Maternal Grandmother    Breast cancer Other        dx. 5s; maternal second cousin   ALS Cousin     Prior to Admission medications   Medication Sig Start Date End Date Taking? Authorizing Provider  acetaminophen (TYLENOL) 500 MG tablet Take 500 mg by mouth as needed for mild pain (pain score 1-3).   Yes [provider]  acyclovir ointment (ZOVIRAX) 5 % Apply 1 Application topically every 3 (three) hours. 04/10/22  Yes Jerene Bears, MD  Calcium-Magnesium-Vitamin D [phone] MG-MG-UNIT TB24 Take 2 capsules by mouth once a week. Take on Wednesday   Yes [provider]  MAGNESIUM GLYCINATE PO Take 2 capsules by mouth at bedtime.   Yes [provider]  RESVERATROL PO Take 2 capsules by mouth daily.   Yes [provider]  sertraline (ZOLOFT) 50 MG tablet TAKE 1 TABLET BY MOUTH EVERY DAY 07/15/23  Yes Malachy Mood, MD  SUMAtriptan (IMITREX) 100 MG tablet May repeat in 2 hours if headache persists or recurs. Patient taking differently: Take 100 mg by mouth every 2 (two) hours as needed for migraine. May repeat in 2 hours if headache persists or recurs. 04/08/23  Yes Jerene Bears, MD  triamcinolone cream (KENALOG) 0.1 % Apply 1 Application topically 2 (two) times daily. Patient taking differently: Apply 1 Application topically as needed. 04/06/22  Yes Jerene Bears, MD  Turmeric (QC TUMERIC COMPLEX PO) Take 1 Dose by mouth daily.   Yes [provider]  letrozole (FEMARA) 2.5 MG tablet TAKE 1 TABLET(2.5 MG) BY MOUTH DAILY Patient not taking: Reported on 08/26/2023 07/16/22   Pollyann Samples, NP  valACYclovir (VALTREX) 1000 MG tablet Take 0.5 tablets (500 mg total) by mouth 2 (two) times daily. Patient taking differently: Take 500 mg by mouth as needed. 07/15/23   Jerene Bears, MD    Physical Exam: Vitals:   08/26/23 0015 08/26/23 0330 08/26/23  0332 08/26/23 0711  BP: 137/63 131/72  125/60  Pulse: 65 60  62  Resp: 13 (!) 22  14  Temp:   98.7 F (37.1 C) 97.9 F (36.6 C)  TempSrc:   Oral Oral  SpO2: 100% 97%  94%  Weight:      Height:        Constitutional: NAD, calm, comfortable Eyes: PERRL, lids and conjunctivae normal ENMT: Mucous membranes are moist. Posterior pharynx clear of any exudate or lesions.Normal dentition.  Neck: normal, supple, no masses, no thyromegaly Respiratory: clear to auscultation bilaterally, no wheezing, no crackles. Normal respiratory effort. No accessory muscle use.  Cardiovascular: Regular rate and rhythm, no murmurs / rubs / gallops. No extremity edema. 2+ pedal pulses. No carotid bruits.  Abdomen: no tenderness, no masses palpated. No hepatosplenomegaly. Bowel sounds positive.  Musculoskeletal:  no clubbing / cyanosis. No joint deformity upper and lower extremities. Good ROM, no contractures. Normal muscle tone.  Skin: no rashes, lesions, ulcers. No induration Neurologic: CN 2-12 grossly intact. Sensation intact, DTR normal. Strength 5/5 in all 4.  Psychiatric: Normal judgment and insight. Alert and oriented x 3. Normal mood.   Data Reviewed:  EKG reveals sinus rhythm at 69 bpm without acute abnormality.  Reviewed labs, imaging, and pertinent records as documented  Assessment and Plan:  Pancreatitis Duodenal diverticulum Transaminitis Acute.  Patient presented with complaints of epigastric pain.  Labs significant for alkaline phosphatase 127, lipase 1725, AST 516, ALT 288, and total bilirubin 0.9.  LFTs had not been elevated previously in the recent past.  CT of the abdomen pelvis significant for large diverticulum possibly causing mass effect leading to mild intra and extrahepatic biliary duct dilatation without significant inflammation around the pancreas.  Clinically concerning for pancreatitis possibly related to missed duodenal diverticulum.  Of note patient has family history of pancreatic  cancer. -Admit to a MedSurg bed -Clear liquid diet and n.p.o. after midnight for EGD in a.m. -Check lipid panel and hepatitis panel -Lactated Ringer's IV fluids at 150 mL/h -Fentanyl IV as needed for pain -Repeat LFTs in a.m. -[facility] consulted, will follow-up for any further recommendations  Leukocytosis Acute.  WBC elevated at 12.7. -Check blood culture -Recheck CBC in a.m.  Anxiety -Continue Zoloft  Hiatal hernia GERD with esophagitis History of Schatzki's ring Patient has a prior history of Schatzki's ring back in 2006 .  Last barium swallow from 2021 noted small hiatal hernia, mild reflux esophagitis, and mild to moderate esophageal dysmotility thought most likely related to chronic esophagitis.  Followed by [facility] in the past. -Add Protonix   DVT prophylaxis: Lovenox  Advance Care Planning:   Code Status: Full Code   Consults: [facility] equal  Family Communication: Friend updated at bedside  Severity of Illness: The appropriate patient status for this patient is INPATIENT. Inpatient status is judged to be reasonable and necessary in order to provide the required intensity of service to ensure the patient's safety. The patient's presenting symptoms, physical exam findings, and initial radiographic and laboratory data in the context of their chronic comorbidities is felt to place them at high risk for further clinical deterioration. Furthermore, it is not anticipated that the patient will be medically stable for discharge from the hospital within 2 midnights of admission.   * I certify that at the point of admission it is my clinical judgment that the patient will require inpatient hospital care spanning beyond 2 midnights from the point of admission due to high intensity of service, high risk for further deterioration and high frequency of surveillance required.*  Author: Clydie Braun, MD 08/26/2023 7:20 AM  For on call review [email or web address].

## 2023-08-26 NOTE — ED Notes (Signed)
Main Lab notified this RN at this time that CMP and Lipase was hemolyzed. All labs were sent and and CBC resulted at 0015.

## 2023-08-26 NOTE — ED Notes (Signed)
ED TO INPATIENT HANDOFF REPORT  ED Nurse Name and Phone #: Josh  S Name/Age/Gender Tiffany Nash 62 y.o. female Room/Bed: 031C/031C  Code Status   Code Status: Not on file  Home/SNF/Other Home Patient oriented to: self, place, time, and situation Is this baseline? Yes   Triage Complete: Triage complete  Chief Complaint Acute biliary pancreatitis [K85.10]  Triage Note Pt BIB [facility] from home. At 1800 pt started to feel "unwell". Around 2230 pt began having upper epigastric pain radiating to her central chest. EMS gave 324mg  Asprin,1 Nitroglycerin and pain went to 4/10. Pt did have 1 episode of vomiting in route; 4mg  Zofran given.    Allergies Allergies  Allergen Reactions   Ibuprofen Other (See Comments)   Oxycodone-Acetaminophen Other (See Comments)    Tachycardia   Tyloxapol Other (See Comments)   Zoledronic Acid Other (See Comments)   Penicillins Rash    Yeast infection    Level of Care/Admitting Diagnosis ED Disposition     ED Disposition  Admit   Condition  --   Comment  Hospital Area: [facility] [[id number]]  Level of Care: Med-Surg [16]  May admit patient to [facility] or [facility] if equivalent level of care is available:: Yes  Covid Evaluation: Asymptomatic - no recent exposure (last 10 days) testing not required  Diagnosis: Acute biliary pancreatitis [161096]  Admitting Physician: Briscoe Deutscher [[id number]]  Attending Physician: Briscoe Deutscher [[id number]]  Certification:: I certify this patient will need inpatient services for at least 2 midnights  Expected Medical Readiness: 08/29/2023          B Medical/Surgery History Past Medical History:  Diagnosis Date   Abnormal Pap smear    Breast cancer (HCC)    Elevated hemoglobin A1c June 2017   5.9   Family history of pancreatic cancer    GERD (gastroesophageal reflux disease)    Migraine    uses imitrex as needed   Osteopenia    Osteoporosis    Schatzki's ring    and  hiatal hernia on EGD   Shingles rash 07/2015    shingles on right rib cage    TMJ (temporomandibular joint syndrome)    Vertigo    Past Surgical History:  Procedure Laterality Date   BREAST BIOPSY  1/12   fibrocystic changes    BREAST LUMPECTOMY WITH RADIOACTIVE SEED AND SENTINEL LYMPH NODE BIOPSY Left 04/22/2016   Procedure: BREAST LUMPECTOMY WITH RADIOACTIVE SEED AND SENTINEL LYMPH NODE BIOPSY;  Surgeon: Glenna Fellows, MD;  Location: [facility];  Service: General;  Laterality: Left;   DILATION AND CURETTAGE OF UTERUS  1991   ESOPHAGEAL DILATION     GYNECOLOGIC CRYOSURGERY  1980s   LAPAROSCOPIC CHOLECYSTECTOMY  1992   WISDOM TOOTH EXTRACTION       A IV Location/Drains/Wounds Patient Lines/Drains/Airways Status     Active Line/Drains/Airways     Name Placement date Placement time Site Days   Peripheral IV 08/25/23 18 G Anterior;Left;Proximal Forearm 08/25/23  2324  Forearm  1            Intake/Output Last 24 hours No intake or output data in the 24 hours ending 08/26/23 0702  Labs/Imaging Results for orders placed or performed during the hospital encounter of 08/25/23 (from the past 48 hour(s))  CBC with Diff     Status: Abnormal   Collection Time: 08/26/23 12:10 AM  Result Value Ref Range   WBC 12.7 (H) 4.0 - 10.5 K/uL  RBC 3.79 (L) 3.87 - 5.11 MIL/uL   Hemoglobin 12.0 12.0 - 15.0 g/dL   HCT 29.5 (L) 28.4 - 13.2 %   MCV 94.7 80.0 - 100.0 fL   MCH 31.7 26.0 - 34.0 pg   MCHC 33.4 30.0 - 36.0 g/dL   RDW 44.0 10.2 - 72.5 %   Platelets 285 150 - 400 K/uL   nRBC 0.0 0.0 - 0.2 %   Neutrophils Relative % 76 %   Neutro Abs 9.8 (H) 1.7 - 7.7 K/uL   Lymphocytes Relative 13 %   Lymphs Abs 1.7 0.7 - 4.0 K/uL   Monocytes Relative 7 %   Monocytes Absolute 0.8 0.1 - 1.0 K/uL   Eosinophils Relative 3 %   Eosinophils Absolute 0.4 0.0 - 0.5 K/uL   Basophils Relative 1 %   Basophils Absolute 0.1 0.0 - 0.1 K/uL   Immature Granulocytes 0 %   Abs Immature  Granulocytes 0.04 0.00 - 0.07 K/uL    Comment: Performed at [facility], [address] [place], [place] [address]  Troponin I (High Sensitivity)     Status: None   Collection Time: 08/26/23 12:10 AM  Result Value Ref Range   Troponin I (High Sensitivity) 3 <18 ng/L    Comment: (NOTE) Elevated high sensitivity troponin I (hsTnI) values and significant  changes across serial measurements may suggest ACS but many other  chronic and acute conditions are known to elevate hsTnI results.  Refer to the "Links" section for chest pain algorithms and additional  guidance. Performed at [facility], [address] [place], [place] [address]   Troponin I (High Sensitivity)     Status: None   Collection Time: 08/26/23  1:38 AM  Result Value Ref Range   Troponin I (High Sensitivity) 4 <18 ng/L    Comment: (NOTE) Elevated high sensitivity troponin I (hsTnI) values and significant  changes across serial measurements may suggest ACS but many other  chronic and acute conditions are known to elevate hsTnI results.  Refer to the "Links" section for chest pain algorithms and additional  guidance. Performed at [facility], [address] [place], [place] [address]   Comprehensive metabolic panel     Status: Abnormal   Collection Time: 08/26/23  1:38 AM  Result Value Ref Range   Sodium 135 135 - 145 mmol/L   Potassium 3.7 3.5 - 5.1 mmol/L   Chloride 102 98 - 111 mmol/L   CO2 24 22 - 32 mmol/L   Glucose, Bld 154 (H) 70 - 99 mg/dL    Comment: Glucose reference range applies only to samples taken after fasting for at least 8 hours.   BUN 13 8 - 23 mg/dL   Creatinine, Ser 3.87 0.44 - 1.00 mg/dL   Calcium 9.4 8.9 - 56.4 mg/dL   Total Protein 6.7 6.5 - 8.1 g/dL   Albumin 3.9 3.5 - 5.0 g/dL   AST 332 (H) 15 - 41 U/L   ALT 288 (H) 0 - 44 U/L   Alkaline Phosphatase 127 (H) 38 - 126 U/L   Total Bilirubin 0.9 <1.2 mg/dL   GFR, Estimated >95 >18 mL/min    Comment:  (NOTE) Calculated using the CKD-EPI Creatinine Equation (2021)    Anion gap 9 5 - 15    Comment: Performed at [facility], [address], [place], [place] [address]  Lipase, blood     Status: Abnormal   Collection Time: 08/26/23  1:38 AM  Result Value Ref Range   Lipase 1,725 (H) 11 - 51 U/L    Comment: RESULTS CONFIRMED BY MANUAL DILUTION Performed at [facility], [address] [place], [place] [address]    CT ABDOMEN PELVIS W CONTRAST  Result Date: 08/26/2023 CLINICAL DATA:  62 year old female with history of upper abdominal pain radiating into the central chest. Suspected pancreatitis. EXAM: CT ABDOMEN AND PELVIS WITH CONTRAST TECHNIQUE: Multidetector CT imaging of the abdomen and pelvis was performed using the standard protocol following bolus administration of intravenous contrast. RADIATION DOSE REDUCTION: This exam was performed according to the departmental dose-optimization program which includes automated exposure control, adjustment of the mA and/or kV according to patient size and/or use of iterative reconstruction technique. CONTRAST:  75mL OMNIPAQUE IOHEXOL 300 MG/ML  SOLN COMPARISON:  CT of the abdomen and pelvis 03/12/2021. FINDINGS: Lower chest: Unremarkable. Hepatobiliary: No suspicious cystic or solid hepatic lesions. Minimal intrahepatic biliary ductal dilatation. Common bile duct is also dilated measuring 11 mm in the porta hepatis, which may simply reflect benign post cholecystectomy physiology. No calcified stone identified in the common bile duct to suggest choledocholithiasis. Pancreas: No pancreatic mass. No pancreatic ductal dilatation. No pancreatic or peripancreatic fluid collections or inflammatory changes. Spleen: Unremarkable. Adrenals/Urinary Tract: Bilateral kidneys and adrenal glands are normal in appearance. No hydroureteronephrosis. Urinary bladder is normal in appearance. Stomach/Bowel: The appearance of the stomach is normal. No pathologic  dilatation of small bowel or colon. However, there is a dilated structure in the right upper quadrant of the abdomen between the duodenal and the common bile duct (axial image 25 of series 3 and coronal image 49 of series 6) which contains a combination of fluid, gas and what appears to be feculent material, likely to represent a large duodenal diverticulum extending off the medial aspect of the second portion of the duodenal. This measures approximately 4.2 x 3.7 x 4.0 cm and exerts local mass effect upon adjacent structures, likely causing some narrowing of the distal common bile duct. Numerous colonic diverticuli are noted, without surrounding inflammatory changes to indicate an acute diverticulitis at this time. Normal appendix. Vascular/Lymphatic: Atherosclerosis in the abdominal aorta and pelvic vasculature. No lymphadenopathy noted in the abdomen or pelvis. Reproductive: Uterus and ovaries are unremarkable in appearance. Other: No significant volume of ascites.  No pneumoperitoneum. Musculoskeletal: There are no aggressive appearing lytic or blastic lesions noted in the visualized portions of the skeleton. IMPRESSION: 1. While there are no definite acute findings confidently identified in the abdomen or pelvis (specifically, no imaging findings of acute pancreatitis), there is what appears to be a very large duodenal diverticulum extending off the medial aspect of the second portion of the duodenal exerting local mass effect. This may narrow the distal common bile duct and is associated with some mild intra and extrahepatic biliary ductal dilatation. This diverticulum is similar in retrospect compared to the prior examination from 2022, but currently contains what appears to be feculent material. No overt surrounding inflammatory changes to indicate a duodenal diverticulitis at this time. 2. Extensive colonic diverticulosis without evidence of acute colonic diverticulitis at this time. 3. Aortic  atherosclerosis. Electronically Signed   By: Trudie Reed M.D.   On: 08/26/2023 05:50   DG Chest Port 1 View  Result Date: 08/26/2023 CLINICAL DATA:  [id number] Epigastric abdominal pain [id number] EXAM: PORTABLE CHEST 1 VIEW COMPARISON:  None Available. FINDINGS: The heart and mediastinal contours are within normal limits. No focal consolidation. No pulmonary edema. No pleural effusion. No  pneumothorax. No acute osseous abnormality.  Left axillary vascular clips. IMPRESSION: No active disease. Electronically Signed   By: Tish Frederickson M.D.   On: 08/26/2023 00:27    Pending Labs Unresulted Labs (From admission, onward)    None       Vitals/Pain Today's Vitals   08/26/23 0015 08/26/23 0330 08/26/23 0332 08/26/23 0426  BP: 137/63 131/72    Pulse: 65 60    Resp: 13 (!) 22    Temp:   98.7 F (37.1 C)   TempSrc:   Oral   SpO2: 100% 97%    Weight:      Height:      PainSc:    Asleep    Isolation Precautions No active isolations  Medications Medications  alum & mag hydroxide-simeth (MAALOX/MYLANTA) 200-200-20 MG/5ML suspension 30 mL (30 mLs Oral Given 08/26/23 0023)    And  lidocaine (XYLOCAINE) 2 % viscous mouth solution 15 mL (15 mLs Oral Given 08/26/23 0023)  sodium chloride 0.9 % bolus 1,000 mL (1,000 mLs Intravenous New Bag/Given 08/26/23 0334)  fentaNYL (SUBLIMAZE) injection 50 mcg (50 mcg Intravenous Given 08/26/23 0332)  iohexol (OMNIPAQUE) 300 MG/ML solution 75 mL (75 mLs Intravenous Contrast Given 08/26/23 0437)    Mobility walks     Focused Assessments     R Recommendations: See Admitting Provider Note  Report given to:   Additional Notes:

## 2023-08-26 NOTE — ED Notes (Signed)
Courtesy call placed.

## 2023-08-26 NOTE — H&P (View-Only) (Signed)
 Referring Provider: TH Primary Care Physician:  Darrow Bussing, MD Primary Gastroenterologist:  Dr. Matthias Hughs   Reason for Consultation: Abdominal pain  HPI: Tiffany' E Nash is a 62 y.o. female with past medical history mentioned below presented to the hospital with acute onset of abdominal pain.  Upon initial evaluation, she was found to have mild elevated white count of 12.7 otherwise normal CBC.  Elevated LFTs with AST of 516, ALT 288 and alkaline phosphatase 127, normal T. bili.  Elevated lipase at 1725.  CT abdomen pelvis with IV contrast showed cholecystectomy status with dilated CBD up to 11 mm without any calcified stones, normal-appearing pancreas.  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  Diverticulum is similar to prior exam in 2022.  Patient seen and examined at bedside.  Family at bedside.  She had acute episode of epigastric abdominal pain without any initial nausea or vomiting.  She was given nitroglycerin on his way to the hospital resulted in 1 episode of vomiting.  Denies any diarrhea or constipation.  Denies any blood in the stool or black stool.  She took Excedrin migraine x 2-day before this episodes but denies any regular use of NSAIDs or Excedrin.   Previous GI workup EGD and colonoscopy in August 2022 by Dr. Matthias Hughs.   EGD showed hiatal hernia, bile reflux, nonobstructive lower esophageal ring.  Gastric biopsies were negative for H. pylori. Colonoscopy was difficult because of fixation as well as multiple diverticula.  Colonoscopy was otherwise normal.  Repeat recommended in 10 years. Past Medical History:  Diagnosis Date   Abnormal Pap smear    Breast cancer (HCC)    Elevated hemoglobin A1c June 2017   5.9   Family history of pancreatic cancer    GERD (gastroesophageal reflux disease)    Migraine    uses imitrex as needed   Osteopenia     Osteoporosis    Schatzki's ring    and hiatal hernia on EGD   Shingles rash 07/2015    shingles on right rib cage    TMJ (temporomandibular joint syndrome)    Vertigo     Past Surgical History:  Procedure Laterality Date   BREAST BIOPSY  1/12   fibrocystic changes    BREAST LUMPECTOMY WITH RADIOACTIVE SEED AND SENTINEL LYMPH NODE BIOPSY Left 04/22/2016   Procedure: BREAST LUMPECTOMY WITH RADIOACTIVE SEED AND SENTINEL LYMPH NODE BIOPSY;  Surgeon: Glenna Fellows, MD;  Location: [facility];  Service: General;  Laterality: Left;   DILATION AND CURETTAGE OF UTERUS  1991   ESOPHAGEAL DILATION     GYNECOLOGIC CRYOSURGERY  1980s   LAPAROSCOPIC CHOLECYSTECTOMY  1992   WISDOM TOOTH EXTRACTION      Prior to Admission medications   Medication Sig Start Date End Date Taking? Authorizing Provider  acetaminophen (TYLENOL) 500 MG tablet Take 500 mg by mouth as needed for mild pain (pain score 1-3).   Yes [provider]  acyclovir ointment (ZOVIRAX) 5 % Apply 1 Application topically every 3 (three) hours. 04/10/22  Yes Jerene Bears, MD  Calcium-Magnesium-Vitamin D [phone] MG-MG-UNIT TB24 Take 2 capsules by mouth once a week. Take on Wednesday   Yes [provider]  MAGNESIUM GLYCINATE PO Take 2 capsules by mouth at bedtime.   Yes [provider]  RESVERATROL PO Take 2 capsules by mouth daily.   Yes [provider]  sertraline (  ZOLOFT) 50 MG tablet TAKE 1 TABLET BY MOUTH EVERY DAY 07/15/23  Yes Malachy Mood, MD  SUMAtriptan (IMITREX) 100 MG tablet May repeat in 2 hours if headache persists or recurs. Patient taking differently: Take 100 mg by mouth every 2 (two) hours as needed for migraine. May repeat in 2 hours if headache persists or recurs. 04/08/23  Yes Jerene Bears, MD  triamcinolone cream (KENALOG) 0.1 % Apply 1 Application topically 2 (two) times daily. Patient taking differently: Apply 1 Application topically as needed. 04/06/22  Yes  Jerene Bears, MD  Turmeric (QC TUMERIC COMPLEX PO) Take 1 Dose by mouth daily.   Yes [provider]  letrozole (FEMARA) 2.5 MG tablet TAKE 1 TABLET(2.5 MG) BY MOUTH DAILY Patient not taking: Reported on 08/26/2023 07/16/22   Pollyann Samples, NP  valACYclovir (VALTREX) 1000 MG tablet Take 0.5 tablets (500 mg total) by mouth 2 (two) times daily. Patient taking differently: Take 500 mg by mouth as needed. 07/15/23   Jerene Bears, MD    Scheduled Meds:  enoxaparin (LOVENOX) injection  40 mg Subcutaneous Daily   pantoprazole  40 mg Oral Daily   sertraline  50 mg Oral Daily   sodium chloride flush  3 mL Intravenous Q12H   Continuous Infusions:  lactated ringers     PRN Meds:.albuterol, fentaNYL (SUBLIMAZE) injection, hydrALAZINE, ondansetron **OR** ondansetron (ZOFRAN) IV  Allergies as of 08/25/2023 - Review Complete 08/25/2023  Allergen Reaction Noted   Ibuprofen Other (See Comments) 06/07/2021   Oxycodone-acetaminophen Other (See Comments) 07/20/2020   Tyloxapol Other (See Comments) 06/07/2021   Zoledronic acid Other (See Comments) 11/06/2020   Penicillins Rash 07/18/2013    Family History  Problem Relation Age of Onset   Diabetes Father    Goiter Mother    Heart disease Other    Pancreatic cancer Brother 76       no genetic testing   Pancreatic cancer Maternal Grandfather        dx. early 50s   ALS Maternal Aunt    Heart Problems Maternal Grandmother    Breast cancer Other        dx. 81s; maternal second cousin   ALS Cousin     Social History   Socioeconomic History   Marital status: Married    Spouse name: Not on file   Number of children: Not on file   Years of education: Not on file   Highest education level: Not on file  Occupational History   Not on file  Tobacco Use   Smoking status: Never   Smokeless tobacco: Never  Vaping Use   Vaping status: Never Used  Substance and Sexual Activity   Alcohol use: Yes    Alcohol/week: 4.0 standard drinks  of alcohol    Types: 4 Standard drinks or equivalent per week    Comment: weekends only    Drug use: No   Sexual activity: Yes    Partners: Male    Comment: husband vasectomy  Other Topics Concern   Not on file  Social History Narrative   Not on file   Social Determinants of Health   Financial Resource Strain: Not on file  Food Insecurity: Not on file  Transportation Needs: Not on file  Physical Activity: Not on file  Stress: Not on file  Social Connections: Not on file  Intimate Partner Violence: Not on file    Review of Systems: All negative except as stated above in HPI.  Physical Exam: Vital signs:  Vitals:   08/26/23 0711 08/26/23 0757  BP: 125/60 136/70  Pulse: 62 (!) 59  Resp: 14 15  Temp: 97.9 F (36.6 C) 98.4 F (36.9 C)  SpO2: 94% 97%     Physical Exam Vitals reviewed.  Constitutional:      General: She is not in acute distress.    Appearance: She is well-developed.  HENT:     Head: Normocephalic and atraumatic.  Eyes:     General: No scleral icterus.    Extraocular Movements: Extraocular movements intact.  Cardiovascular:     Rate and Rhythm: Normal rate and regular rhythm.     Heart sounds: Normal heart sounds.  Pulmonary:     Effort: Pulmonary effort is normal. No respiratory distress.  Abdominal:     General: Bowel sounds are normal. There is no distension.     Palpations: Abdomen is soft.     Tenderness: There is no abdominal tenderness.     Hernia: No hernia is present.  Skin:    General: Skin is warm.     Coloration: Skin is not jaundiced.  Neurological:     General: No focal deficit present.     Mental Status: She is alert and oriented to person, place, and time.  Psychiatric:        Mood and Affect: Mood normal. Mood is not anxious or depressed.        Behavior: Behavior normal.      GI:  Lab Results: Recent Labs    08/26/23 0010  WBC 12.7*  HGB 12.0  HCT 35.9*  PLT 285   BMET Recent Labs    08/26/23 0138  NA 135   K 3.7  CL 102  CO2 24  GLUCOSE 154*  BUN 13  CREATININE 0.83  CALCIUM 9.4   LFT Recent Labs    08/26/23 0138  PROT 6.7  ALBUMIN 3.9  AST 516*  ALT 288*  ALKPHOS 127*  BILITOT 0.9   PT/INR No results for input(s): "LABPROT", "INR" in the last 72 hours.   Studies/Results: CT ABDOMEN PELVIS W CONTRAST  Result Date: 08/26/2023 CLINICAL DATA:  62 year old female with history of upper abdominal pain radiating into the central chest. Suspected pancreatitis. EXAM: CT ABDOMEN AND PELVIS WITH CONTRAST TECHNIQUE: Multidetector CT imaging of the abdomen and pelvis was performed using the standard protocol following bolus administration of intravenous contrast. RADIATION DOSE REDUCTION: This exam was performed according to the departmental dose-optimization program which includes automated exposure control, adjustment of the mA and/or kV according to patient size and/or use of iterative reconstruction technique. CONTRAST:  75mL OMNIPAQUE IOHEXOL 300 MG/ML  SOLN COMPARISON:  CT of the abdomen and pelvis 03/12/2021. FINDINGS: Lower chest: Unremarkable. Hepatobiliary: No suspicious cystic or solid hepatic lesions. Minimal intrahepatic biliary ductal dilatation. Common bile duct is also dilated measuring 11 mm in the porta hepatis, which may simply reflect benign post cholecystectomy physiology. No calcified stone identified in the common bile duct to suggest choledocholithiasis. Pancreas: No pancreatic mass. No pancreatic ductal dilatation. No pancreatic or peripancreatic fluid collections or inflammatory changes. Spleen: Unremarkable. Adrenals/Urinary Tract: Bilateral kidneys and adrenal glands are normal in appearance. No hydroureteronephrosis. Urinary bladder is normal in appearance. Stomach/Bowel: The appearance of the stomach is normal. No pathologic dilatation of small bowel or colon. However, there is a dilated structure in the right upper quadrant of the abdomen between the duodenal and the  common bile duct (axial image 25 of series 3 and coronal image 49 of series 6)  which contains a combination of fluid, gas and what appears to be feculent material, likely to represent a large duodenal diverticulum extending off the medial aspect of the second portion of the duodenal. This measures approximately 4.2 x 3.7 x 4.0 cm and exerts local mass effect upon adjacent structures, likely causing some narrowing of the distal common bile duct. Numerous colonic diverticuli are noted, without surrounding inflammatory changes to indicate an acute diverticulitis at this time. Normal appendix. Vascular/Lymphatic: Atherosclerosis in the abdominal aorta and pelvic vasculature. No lymphadenopathy noted in the abdomen or pelvis. Reproductive: Uterus and ovaries are unremarkable in appearance. Other: No significant volume of ascites.  No pneumoperitoneum. Musculoskeletal: There are no aggressive appearing lytic or blastic lesions noted in the visualized portions of the skeleton. IMPRESSION: 1. While there are no definite acute findings confidently identified in the abdomen or pelvis (specifically, no imaging findings of acute pancreatitis), there is what appears to be a very large duodenal diverticulum extending off the medial aspect of the second portion of the duodenal exerting local mass effect. This may narrow the distal common bile duct and is associated with some mild intra and extrahepatic biliary ductal dilatation. This diverticulum is similar in retrospect compared to the prior examination from 2022, but currently contains what appears to be feculent material. No overt surrounding inflammatory changes to indicate a duodenal diverticulitis at this time. 2. Extensive colonic diverticulosis without evidence of acute colonic diverticulitis at this time. 3. Aortic atherosclerosis. Electronically Signed   By: Trudie Reed M.D.   On: 08/26/2023 05:50   DG Chest Port 1 View  Result Date: 08/26/2023 CLINICAL DATA:   [id number] Epigastric abdominal pain [id number] EXAM: PORTABLE CHEST 1 VIEW COMPARISON:  None Available. FINDINGS: The heart and mediastinal contours are within normal limits. No focal consolidation. No pulmonary edema. No pleural effusion. No pneumothorax. No acute osseous abnormality.  Left axillary vascular clips. IMPRESSION: No active disease. Electronically Signed   By: Tish Frederickson M.D.   On: 08/26/2023 00:27    Impression/Plan: -Acute onset of abdominal pain with CT scan showing large 4 cm duodenal diverticulum containing feculent material causing mass effect on biliary system causing narrowing of the distal common bile duct with mild intra and extrahepatic biliary ductal dilation. -Acute pancreatitis based on epigastric abdominal pain and elevated lipase of 1725.  CT scan showed normal-appearing pancreas. -Abnormal LFTs.  Likely from above.  Recommendations ------------------------- -Recommend EGD for further evaluation of abnormal CT scan findings concerning for large duodenal diverticulum. -Continue LR at 150 cc/h.  Patient is asymptomatic now. -If EGD negative, she will likely need MRI MRCP for further evaluation. -Continue clear liquid diet today.  Keep n.p.o. past midnight.  Plan for EGD tomorrow. -Repeat LFTs  Risks (bleeding, infection, bowel perforation that could require surgery, sedation-related changes in cardiopulmonary systems), benefits (identification and possible treatment of source of symptoms, exclusion of certain causes of symptoms), and alternatives (watchful waiting, radiographic imaging studies, empiric medical treatment)  were explained to patient/family in detail and patient wishes to proceed.     LOS: 0 days   Kathi Der  MD, FACP 08/26/2023, 8:28 AM  Contact #  [phone]

## 2023-08-26 NOTE — ED Provider Notes (Addendum)
[place] EMERGENCY DEPARTMENT AT [facility] Provider Note  CSN: [id number] Arrival date & time: 08/25/23 2314  Chief Complaint(s) Abdominal Pain  HPI Tiffany Nash is a 62 y.o. female     Abdominal Pain Pain location:  Epigastric, LUQ and RUQ Pain radiates to:  Chest Pain severity:  Moderate Duration:  2 hours Timing:  Constant Progression:  Waxing and waning Chronicity:  New Relieved by:  Nothing Worsened by:  Movement Associated symptoms: nausea and vomiting   Associated symptoms: no diarrhea   Associated symptoms comment:  Diaphoretic  Reports having [place] food in the afternoon and did not have dinner.  Reports having 1 beer with lunch this afternoon.  Denies any daily use of alcohol but they reports having a significant amount of alcohol 2 days ago after receiving some bad news about her husband's health.  Past Medical History Past Medical History:  Diagnosis Date  . Abnormal Pap smear   . Breast cancer (HCC)   . Elevated hemoglobin A1c June 2017   5.9  . Family history of pancreatic cancer   . GERD (gastroesophageal reflux disease)   . Migraine    uses imitrex as needed  . Osteopenia   . Osteoporosis   . Schatzki's ring    and hiatal hernia on EGD  . Shingles rash 07/2015    shingles on right rib cage   . TMJ (temporomandibular joint syndrome)   . Vertigo    Patient Active Problem List   Diagnosis Date Noted  . Paresthesia 01/26/2022  . Anxiety 01/26/2022  . Chronic insomnia 01/26/2022  . Neck pain 01/26/2022  . Chronic superficial gastritis without bleeding 12/19/2021  . Gastroesophageal reflux disease 12/19/2021  . Stricture of esophagus 12/19/2021  . Age-related osteoporosis without current pathological fracture 01/01/2021  . Colon cancer screening 05/28/2020  . Family history of pancreatic cancer   . Osteoporosis 12/16/2018  . Breast cancer of upper-outer quadrant of left female breast (HCC) 04/10/2016  . History of shingles  03/25/2016  . Family history of early CAD 08/16/2013  . Migraine with aura   . TMJ (temporomandibular joint syndrome)   . Abnormal Pap smear   . Schatzki's ring   . Vertigo    Home Medication(s) Prior to Admission medications   Medication Sig Start Date End Date Taking? Authorizing Provider  acetaminophen (TYLENOL) 500 MG tablet Take 500 mg by mouth as needed for mild pain (pain score 1-3).   Yes [provider]  acyclovir ointment (ZOVIRAX) 5 % Apply 1 Application topically every 3 (three) hours. 04/10/22  Yes Jerene Bears, MD  Calcium-Magnesium-Vitamin D [id number] MG-MG-UNIT TB24 Take 2 capsules by mouth once a week. Take on Wednesday   Yes [provider]  MAGNESIUM GLYCINATE PO Take 2 capsules by mouth at bedtime.   Yes [provider]  RESVERATROL PO Take 2 capsules by mouth daily.   Yes [provider]  sertraline (ZOLOFT) 50 MG tablet TAKE 1 TABLET BY MOUTH EVERY DAY 07/15/23  Yes Malachy Mood, MD  SUMAtriptan (IMITREX) 100 MG tablet May repeat in 2 hours if headache persists or recurs. Patient taking differently: Take 100 mg by mouth every 2 (two) hours as needed for migraine. May repeat in 2 hours if headache persists or recurs. 04/08/23  Yes Jerene Bears, MD  triamcinolone cream (KENALOG) 0.1 % Apply 1 Application topically 2 (two) times daily. Patient taking differently: Apply 1 Application topically as needed. 04/06/22  Yes Jerene Bears, MD  Turmeric (QC TUMERIC COMPLEX PO) Take 1 Dose by mouth daily.   Yes [provider]  letrozole (FEMARA) 2.5 MG tablet TAKE 1 TABLET(2.5 MG) BY MOUTH DAILY Patient not taking: Reported on 08/26/2023 07/16/22   Pollyann Samples, NP  valACYclovir (VALTREX) 1000 MG tablet Take 0.5 tablets (500 mg total) by mouth 2 (two) times daily. Patient taking differently: Take 500 mg by mouth as needed. 07/15/23   Jerene Bears, MD                                                                                                                                     Allergies Ibuprofen, Oxycodone-acetaminophen, Tyloxapol, Zoledronic acid, and Penicillins  Review of Systems Review of Systems  Gastrointestinal:  Positive for abdominal pain, nausea and vomiting. Negative for diarrhea.   As noted in HPI  Physical Exam Vital Signs  I have reviewed the triage vital signs BP 131/72   Pulse 60   Temp 98.7 F (37.1 C) (Oral)   Resp (!) 22   Ht 5\' 6"  (1.676 m)   Wt 69.9 kg   LMP 05/12/2012   SpO2 97%   BMI 24.86 kg/m   Physical Exam Vitals reviewed.  Constitutional:      General: She is not in acute distress.    Appearance: She is well-developed. She is not diaphoretic.  HENT:     Head: Normocephalic and atraumatic.     Nose: Nose normal.  Eyes:     General: No scleral icterus.       Right eye: No discharge.        Left eye: No discharge.     Conjunctiva/sclera: Conjunctivae normal.     Pupils: Pupils are equal, round, and reactive to light.  Cardiovascular:     Rate and Rhythm: Normal rate and regular rhythm.     Heart sounds: No murmur heard.    No friction rub. No gallop.  Pulmonary:     Effort: Pulmonary effort is normal. No respiratory distress.     Breath sounds: Normal breath sounds. No stridor. No rales.  Abdominal:     General: There is no distension.     Palpations: Abdomen is soft.     Tenderness: There is abdominal tenderness (mild) in the epigastric area and left upper quadrant.  Musculoskeletal:        General: No tenderness.     Cervical back: Normal range of motion and neck supple.  Skin:    General: Skin is warm and dry.     Findings: No erythema or rash.  Neurological:     Mental Status: She is alert and oriented to person, place, and time.     ED Results and Treatments Labs (all labs ordered are listed, but only abnormal results are displayed) Labs Reviewed  CBC WITH DIFFERENTIAL/PLATELET - Abnormal; Notable for the following components:      Result Value  WBC 12.7 (*)    RBC 3.79 (*)    HCT 35.9 (*)    Neutro Abs 9.8 (*)    All other components within normal limits  COMPREHENSIVE METABOLIC PANEL - Abnormal; Notable for the following components:   Glucose, Bld 154 (*)    AST 516 (*)    ALT 288 (*)    Alkaline Phosphatase 127 (*)    All other components within normal limits  LIPASE, BLOOD - Abnormal; Notable for the following components:   Lipase 1,725 (*)    All other components within normal limits  TROPONIN I (HIGH SENSITIVITY)  TROPONIN I (HIGH SENSITIVITY)                                                                                                                         EKG  EKG Interpretation Date/Time:  Thursday August 26 2023 00:26:56 EST Ventricular Rate:  69 PR Interval:  170 QRS Duration:  102 QT Interval:  416 QTC Calculation: 446 R Axis:   53  Text Interpretation: Sinus rhythm No significant change was found Confirmed by Drema Pry [phone]) on 08/26/2023 12:52:29 AM       Radiology CT ABDOMEN PELVIS W CONTRAST  Result Date: 08/26/2023 CLINICAL DATA:  62 year old female with history of upper abdominal pain radiating into the central chest. Suspected pancreatitis. EXAM: CT ABDOMEN AND PELVIS WITH CONTRAST TECHNIQUE: Multidetector CT imaging of the abdomen and pelvis was performed using the standard protocol following bolus administration of intravenous contrast. RADIATION DOSE REDUCTION: This exam was performed according to the departmental dose-optimization program which includes automated exposure control, adjustment of the mA and/or kV according to patient size and/or use of iterative reconstruction technique. CONTRAST:  75mL OMNIPAQUE IOHEXOL 300 MG/ML  SOLN COMPARISON:  CT of the abdomen and pelvis 03/12/2021. FINDINGS: Lower chest: Unremarkable. Hepatobiliary: No suspicious cystic or solid hepatic lesions. Minimal intrahepatic biliary ductal dilatation. Common bile duct is also dilated measuring 11 mm in the  porta hepatis, which may simply reflect benign post cholecystectomy physiology. No calcified stone identified in the common bile duct to suggest choledocholithiasis. Pancreas: No pancreatic mass. No pancreatic ductal dilatation. No pancreatic or peripancreatic fluid collections or inflammatory changes. Spleen: Unremarkable. Adrenals/Urinary Tract: Bilateral kidneys and adrenal glands are normal in appearance. No hydroureteronephrosis. Urinary bladder is normal in appearance. Stomach/Bowel: The appearance of the stomach is normal. No pathologic dilatation of small bowel or colon. However, there is a dilated structure in the right upper quadrant of the abdomen between the duodenal and the common bile duct (axial image 25 of series 3 and coronal image 49 of series 6) which contains a combination of fluid, gas and what appears to be feculent material, likely to represent a large duodenal diverticulum extending off the medial aspect of the second portion of the duodenal. This measures approximately 4.2 x 3.7 x 4.0 cm and exerts local mass effect upon adjacent structures, likely causing some narrowing of the distal common bile duct. Numerous  colonic diverticuli are noted, without surrounding inflammatory changes to indicate an acute diverticulitis at this time. Normal appendix. Vascular/Lymphatic: Atherosclerosis in the abdominal aorta and pelvic vasculature. No lymphadenopathy noted in the abdomen or pelvis. Reproductive: Uterus and ovaries are unremarkable in appearance. Other: No significant volume of ascites.  No pneumoperitoneum. Musculoskeletal: There are no aggressive appearing lytic or blastic lesions noted in the visualized portions of the skeleton. IMPRESSION: 1. While there are no definite acute findings confidently identified in the abdomen or pelvis (specifically, no imaging findings of acute pancreatitis), there is what appears to be a very large duodenal diverticulum extending off the medial aspect of the  second portion of the duodenal exerting local mass effect. This may narrow the distal common bile duct and is associated with some mild intra and extrahepatic biliary ductal dilatation. This diverticulum is similar in retrospect compared to the prior examination from 2022, but currently contains what appears to be feculent material. No overt surrounding inflammatory changes to indicate a duodenal diverticulitis at this time. 2. Extensive colonic diverticulosis without evidence of acute colonic diverticulitis at this time. 3. Aortic atherosclerosis. Electronically Signed   By: Trudie Reed M.D.   On: 08/26/2023 05:50   DG Chest Port 1 View  Result Date: 08/26/2023 CLINICAL DATA:  [id number] Epigastric abdominal pain [id number] EXAM: PORTABLE CHEST 1 VIEW COMPARISON:  None Available. FINDINGS: The heart and mediastinal contours are within normal limits. No focal consolidation. No pulmonary edema. No pleural effusion. No pneumothorax. No acute osseous abnormality.  Left axillary vascular clips. IMPRESSION: No active disease. Electronically Signed   By: Tish Frederickson M.D.   On: 08/26/2023 00:27    Medications Ordered in ED Medications  alum & mag hydroxide-simeth (MAALOX/MYLANTA) 200-200-20 MG/5ML suspension 30 mL (30 mLs Oral Given 08/26/23 0023)    And  lidocaine (XYLOCAINE) 2 % viscous mouth solution 15 mL (15 mLs Oral Given 08/26/23 0023)  sodium chloride 0.9 % bolus 1,000 mL (1,000 mLs Intravenous New Bag/Given 08/26/23 0334)  fentaNYL (SUBLIMAZE) injection 50 mcg (50 mcg Intravenous Given 08/26/23 0332)  iohexol (OMNIPAQUE) 300 MG/ML solution 75 mL (75 mLs Intravenous Contrast Given 08/26/23 0437)   Procedures Procedures  (including critical care time) Medical Decision Making / ED Course   Medical Decision Making Amount and/or Complexity of Data Reviewed Labs: ordered. Decision-making details documented in ED Course. Radiology: ordered and independent interpretation performed.  Decision-making details documented in ED Course. ECG/medicine tests: ordered and independent interpretation performed. Decision-making details documented in ED Course.  Risk OTC drugs. Prescription drug management. Parenteral controlled substances. Decision regarding hospitalization.    Epigastric abdominal pain radiating up to the chest Differential and workup listed below  Atypical for ACS though given patient's age and gender, cardiac workup was obtained.  EKG without acute ischemic changes or evidence of pericarditis.  Serial troponins negative x 2.  Chest x-ray without evidence of pneumonia, pneumothorax, pulmonary edema pleural effusions. Doubt acute aortic process.  Doubt PE.  CBC with leukocytosis.  No anemia. CMP without significant electrolyte derangement or renal sufficiency.  Mild hyperglycemia without DKA. Transaminitis without overt biliary obstruction. Significantly elevated lipase at 1700. CT abdomen pelvis with no pancreatitic inflammatory changes yet, but there is dilated common bile duct 2/2 duodenal diverticula.  No obvious stone.  Will need admission for IV hydration and specialty consultation to determine management.  Clinical Course as of 08/26/23 [id number]  Thu Aug 26, 2023  [id number] Spoke with Dr. Antionette Char from hospitalist service who will admit. Direct message sent  to Dr. Myrtie Neither from [facility] for consultation during admission. [PC]    Clinical Course User Index [PC] Narayan Scull, Amadeo Garnet, MD    Final Clinical Impression(s) / ED Diagnoses Final diagnoses:  Acute pancreatitis without infection or necrosis, unspecified pancreatitis type    This chart was dictated using voice recognition software.  Despite best efforts to proofread,  errors can occur which can change the documentation meaning.      Nira Conn, MD 08/26/23 [phone]

## 2023-08-26 NOTE — Consult Note (Signed)
Referring Provider: TH Primary Care Physician:  Darrow Bussing, MD Primary Gastroenterologist:  Dr. Matthias Hughs   Reason for Consultation: Abdominal pain  HPI: Tiffany Nash is a 62 y.o. female with past medical history mentioned below presented to the hospital with acute onset of abdominal pain.  Upon initial evaluation, she was found to have mild elevated white count of 12.7 otherwise normal CBC.  Elevated LFTs with AST of 516, ALT 288 and alkaline phosphatase 127, normal T. bili.  Elevated lipase at 1725.  CT abdomen pelvis with IV contrast showed cholecystectomy status with dilated CBD up to 11 mm without any calcified stones, normal-appearing pancreas.  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  Diverticulum is similar to prior exam in 2022.  Patient seen and examined at bedside.  Family at bedside.  She had acute episode of epigastric abdominal pain without any initial nausea or vomiting.  She was given nitroglycerin on his way to the hospital resulted in 1 episode of vomiting.  Denies any diarrhea or constipation.  Denies any blood in the stool or black stool.  She took Excedrin migraine x 2-day before this episodes but denies any regular use of NSAIDs or Excedrin.   Previous GI workup EGD and colonoscopy in August 2022 by Dr. Matthias Hughs.   EGD showed hiatal hernia, bile reflux, nonobstructive lower esophageal ring.  Gastric biopsies were negative for H. pylori. Colonoscopy was difficult because of fixation as well as multiple diverticula.  Colonoscopy was otherwise normal.  Repeat recommended in 10 years. Past Medical History:  Diagnosis Date   Abnormal Pap smear    Breast cancer (HCC)    Elevated hemoglobin A1c June 2017   5.9   Family history of pancreatic cancer    GERD (gastroesophageal reflux disease)    Migraine    uses imitrex as needed   Osteopenia     Osteoporosis    Schatzki's ring    and hiatal hernia on EGD   Shingles rash 07/2015    shingles on right rib cage    TMJ (temporomandibular joint syndrome)    Vertigo     Past Surgical History:  Procedure Laterality Date   BREAST BIOPSY  1/12   fibrocystic changes    BREAST LUMPECTOMY WITH RADIOACTIVE SEED AND SENTINEL LYMPH NODE BIOPSY Left 04/22/2016   Procedure: BREAST LUMPECTOMY WITH RADIOACTIVE SEED AND SENTINEL LYMPH NODE BIOPSY;  Surgeon: Glenna Fellows, MD;  Location: [facility];  Service: General;  Laterality: Left;   DILATION AND CURETTAGE OF UTERUS  1991   ESOPHAGEAL DILATION     GYNECOLOGIC CRYOSURGERY  1980s   LAPAROSCOPIC CHOLECYSTECTOMY  1992   WISDOM TOOTH EXTRACTION      Prior to Admission medications   Medication Sig Start Date End Date Taking? Authorizing Provider  acetaminophen (TYLENOL) 500 MG tablet Take 500 mg by mouth as needed for mild pain (pain score 1-3).   Yes [provider]  acyclovir ointment (ZOVIRAX) 5 % Apply 1 Application topically every 3 (three) hours. 04/10/22  Yes Jerene Bears, MD  Calcium-Magnesium-Vitamin D [phone] MG-MG-UNIT TB24 Take 2 capsules by mouth once a week. Take on Wednesday   Yes [provider]  MAGNESIUM GLYCINATE PO Take 2 capsules by mouth at bedtime.   Yes [provider]  RESVERATROL PO Take 2 capsules by mouth daily.   Yes [provider]  sertraline (  ZOLOFT) 50 MG tablet TAKE 1 TABLET BY MOUTH EVERY DAY 07/15/23  Yes Malachy Mood, MD  SUMAtriptan (IMITREX) 100 MG tablet May repeat in 2 hours if headache persists or recurs. Patient taking differently: Take 100 mg by mouth every 2 (two) hours as needed for migraine. May repeat in 2 hours if headache persists or recurs. 04/08/23  Yes Jerene Bears, MD  triamcinolone cream (KENALOG) 0.1 % Apply 1 Application topically 2 (two) times daily. Patient taking differently: Apply 1 Application topically as needed. 04/06/22  Yes  Jerene Bears, MD  Turmeric (QC TUMERIC COMPLEX PO) Take 1 Dose by mouth daily.   Yes [provider]  letrozole (FEMARA) 2.5 MG tablet TAKE 1 TABLET(2.5 MG) BY MOUTH DAILY Patient not taking: Reported on 08/26/2023 07/16/22   Pollyann Samples, NP  valACYclovir (VALTREX) 1000 MG tablet Take 0.5 tablets (500 mg total) by mouth 2 (two) times daily. Patient taking differently: Take 500 mg by mouth as needed. 07/15/23   Jerene Bears, MD    Scheduled Meds:  enoxaparin (LOVENOX) injection  40 mg Subcutaneous Daily   pantoprazole  40 mg Oral Daily   sertraline  50 mg Oral Daily   sodium chloride flush  3 mL Intravenous Q12H   Continuous Infusions:  lactated ringers     PRN Meds:.albuterol, fentaNYL (SUBLIMAZE) injection, hydrALAZINE, ondansetron **OR** ondansetron (ZOFRAN) IV  Allergies as of 08/25/2023 - Review Complete 08/25/2023  Allergen Reaction Noted   Ibuprofen Other (See Comments) 06/07/2021   Oxycodone-acetaminophen Other (See Comments) 07/20/2020   Tyloxapol Other (See Comments) 06/07/2021   Zoledronic acid Other (See Comments) 11/06/2020   Penicillins Rash 07/18/2013    Family History  Problem Relation Age of Onset   Diabetes Father    Goiter Mother    Heart disease Other    Pancreatic cancer Brother 76       no genetic testing   Pancreatic cancer Maternal Grandfather        dx. early 50s   ALS Maternal Aunt    Heart Problems Maternal Grandmother    Breast cancer Other        dx. 81s; maternal second cousin   ALS Cousin     Social History   Socioeconomic History   Marital status: Married    Spouse name: Not on file   Number of children: Not on file   Years of education: Not on file   Highest education level: Not on file  Occupational History   Not on file  Tobacco Use   Smoking status: Never   Smokeless tobacco: Never  Vaping Use   Vaping status: Never Used  Substance and Sexual Activity   Alcohol use: Yes    Alcohol/week: 4.0 standard drinks  of alcohol    Types: 4 Standard drinks or equivalent per week    Comment: weekends only    Drug use: No   Sexual activity: Yes    Partners: Male    Comment: husband vasectomy  Other Topics Concern   Not on file  Social History Narrative   Not on file   Social Determinants of Health   Financial Resource Strain: Not on file  Food Insecurity: Not on file  Transportation Needs: Not on file  Physical Activity: Not on file  Stress: Not on file  Social Connections: Not on file  Intimate Partner Violence: Not on file    Review of Systems: All negative except as stated above in HPI.  Physical Exam: Vital signs:  Vitals:   08/26/23 0711 08/26/23 0757  BP: 125/60 136/70  Pulse: 62 (!) 59  Resp: 14 15  Temp: 97.9 F (36.6 C) 98.4 F (36.9 C)  SpO2: 94% 97%     Physical Exam Vitals reviewed.  Constitutional:      General: She is not in acute distress.    Appearance: She is well-developed.  HENT:     Head: Normocephalic and atraumatic.  Eyes:     General: No scleral icterus.    Extraocular Movements: Extraocular movements intact.  Cardiovascular:     Rate and Rhythm: Normal rate and regular rhythm.     Heart sounds: Normal heart sounds.  Pulmonary:     Effort: Pulmonary effort is normal. No respiratory distress.  Abdominal:     General: Bowel sounds are normal. There is no distension.     Palpations: Abdomen is soft.     Tenderness: There is no abdominal tenderness.     Hernia: No hernia is present.  Skin:    General: Skin is warm.     Coloration: Skin is not jaundiced.  Neurological:     General: No focal deficit present.     Mental Status: She is alert and oriented to person, place, and time.  Psychiatric:        Mood and Affect: Mood normal. Mood is not anxious or depressed.        Behavior: Behavior normal.      GI:  Lab Results: Recent Labs    08/26/23 0010  WBC 12.7*  HGB 12.0  HCT 35.9*  PLT 285   BMET Recent Labs    08/26/23 0138  NA 135   K 3.7  CL 102  CO2 24  GLUCOSE 154*  BUN 13  CREATININE 0.83  CALCIUM 9.4   LFT Recent Labs    08/26/23 0138  PROT 6.7  ALBUMIN 3.9  AST 516*  ALT 288*  ALKPHOS 127*  BILITOT 0.9   PT/INR No results for input(s): "LABPROT", "INR" in the last 72 hours.   Studies/Results: CT ABDOMEN PELVIS W CONTRAST  Result Date: 08/26/2023 CLINICAL DATA:  62 year old female with history of upper abdominal pain radiating into the central chest. Suspected pancreatitis. EXAM: CT ABDOMEN AND PELVIS WITH CONTRAST TECHNIQUE: Multidetector CT imaging of the abdomen and pelvis was performed using the standard protocol following bolus administration of intravenous contrast. RADIATION DOSE REDUCTION: This exam was performed according to the departmental dose-optimization program which includes automated exposure control, adjustment of the mA and/or kV according to patient size and/or use of iterative reconstruction technique. CONTRAST:  75mL OMNIPAQUE IOHEXOL 300 MG/ML  SOLN COMPARISON:  CT of the abdomen and pelvis 03/12/2021. FINDINGS: Lower chest: Unremarkable. Hepatobiliary: No suspicious cystic or solid hepatic lesions. Minimal intrahepatic biliary ductal dilatation. Common bile duct is also dilated measuring 11 mm in the porta hepatis, which may simply reflect benign post cholecystectomy physiology. No calcified stone identified in the common bile duct to suggest choledocholithiasis. Pancreas: No pancreatic mass. No pancreatic ductal dilatation. No pancreatic or peripancreatic fluid collections or inflammatory changes. Spleen: Unremarkable. Adrenals/Urinary Tract: Bilateral kidneys and adrenal glands are normal in appearance. No hydroureteronephrosis. Urinary bladder is normal in appearance. Stomach/Bowel: The appearance of the stomach is normal. No pathologic dilatation of small bowel or colon. However, there is a dilated structure in the right upper quadrant of the abdomen between the duodenal and the  common bile duct (axial image 25 of series 3 and coronal image 49 of series 6)  which contains a combination of fluid, gas and what appears to be feculent material, likely to represent a large duodenal diverticulum extending off the medial aspect of the second portion of the duodenal. This measures approximately 4.2 x 3.7 x 4.0 cm and exerts local mass effect upon adjacent structures, likely causing some narrowing of the distal common bile duct. Numerous colonic diverticuli are noted, without surrounding inflammatory changes to indicate an acute diverticulitis at this time. Normal appendix. Vascular/Lymphatic: Atherosclerosis in the abdominal aorta and pelvic vasculature. No lymphadenopathy noted in the abdomen or pelvis. Reproductive: Uterus and ovaries are unremarkable in appearance. Other: No significant volume of ascites.  No pneumoperitoneum. Musculoskeletal: There are no aggressive appearing lytic or blastic lesions noted in the visualized portions of the skeleton. IMPRESSION: 1. While there are no definite acute findings confidently identified in the abdomen or pelvis (specifically, no imaging findings of acute pancreatitis), there is what appears to be a very large duodenal diverticulum extending off the medial aspect of the second portion of the duodenal exerting local mass effect. This may narrow the distal common bile duct and is associated with some mild intra and extrahepatic biliary ductal dilatation. This diverticulum is similar in retrospect compared to the prior examination from 2022, but currently contains what appears to be feculent material. No overt surrounding inflammatory changes to indicate a duodenal diverticulitis at this time. 2. Extensive colonic diverticulosis without evidence of acute colonic diverticulitis at this time. 3. Aortic atherosclerosis. Electronically Signed   By: Trudie Reed M.D.   On: 08/26/2023 05:50   DG Chest Port 1 View  Result Date: 08/26/2023 CLINICAL DATA:   [id number] Epigastric abdominal pain [id number] EXAM: PORTABLE CHEST 1 VIEW COMPARISON:  None Available. FINDINGS: The heart and mediastinal contours are within normal limits. No focal consolidation. No pulmonary edema. No pleural effusion. No pneumothorax. No acute osseous abnormality.  Left axillary vascular clips. IMPRESSION: No active disease. Electronically Signed   By: Tish Frederickson M.D.   On: 08/26/2023 00:27    Impression/Plan: -Acute onset of abdominal pain with CT scan showing large 4 cm duodenal diverticulum containing feculent material causing mass effect on biliary system causing narrowing of the distal common bile duct with mild intra and extrahepatic biliary ductal dilation. -Acute pancreatitis based on epigastric abdominal pain and elevated lipase of 1725.  CT scan showed normal-appearing pancreas. -Abnormal LFTs.  Likely from above.  Recommendations ------------------------- -Recommend EGD for further evaluation of abnormal CT scan findings concerning for large duodenal diverticulum. -Continue LR at 150 cc/h.  Patient is asymptomatic now. -If EGD negative, she will likely need MRI MRCP for further evaluation. -Continue clear liquid diet today.  Keep n.p.o. past midnight.  Plan for EGD tomorrow. -Repeat LFTs  Risks (bleeding, infection, bowel perforation that could require surgery, sedation-related changes in cardiopulmonary systems), benefits (identification and possible treatment of source of symptoms, exclusion of certain causes of symptoms), and alternatives (watchful waiting, radiographic imaging studies, empiric medical treatment)  were explained to patient/family in detail and patient wishes to proceed.     LOS: 0 days   Kathi Der  MD, FACP 08/26/2023, 8:28 AM  Contact #  [phone]

## 2023-08-27 ENCOUNTER — Encounter (HOSPITAL_COMMUNITY)
Admission: EM | Disposition: A | Discharge status: Home / Self Care | Payer: Self-pay | Source: Home / Self Care | Attending: Internal Medicine

## 2023-08-27 ENCOUNTER — Inpatient Hospital Stay (HOSPITAL_COMMUNITY): Payer: BC Managed Care – PPO

## 2023-08-27 ENCOUNTER — Encounter (HOSPITAL_COMMUNITY): Payer: Self-pay | Admitting: Internal Medicine

## 2023-08-27 DIAGNOSIS — K851 Biliary acute pancreatitis without necrosis or infection: Secondary | ICD-10-CM

## 2023-08-27 HISTORY — PX: ESOPHAGOGASTRODUODENOSCOPY (EGD) WITH PROPOFOL: SHX5813

## 2023-08-27 HISTORY — PX: BIOPSY: SHX5522

## 2023-08-27 LAB — COMPREHENSIVE METABOLIC PANEL
ALT: 266 U/L — ABNORMAL HIGH (ref 0–44)
AST: 131 U/L — ABNORMAL HIGH (ref 15–41)
Albumin: 3.4 g/dL — ABNORMAL LOW (ref 3.5–5.0)
Alkaline Phosphatase: 111 U/L (ref 38–126)
Anion gap: 8 (ref 5–15)
BUN: 5 mg/dL — ABNORMAL LOW (ref 8–23)
CO2: 22 mmol/L (ref 22–32)
Calcium: 9 mg/dL (ref 8.9–10.3)
Chloride: 107 mmol/L (ref 98–111)
Creatinine, Ser: 0.89 mg/dL (ref 0.44–1.00)
GFR, Estimated: >60 mL/min (ref >60)
Glucose, Bld: 120 mg/dL — ABNORMAL HIGH (ref 70–99)
Potassium: 3.9 mmol/L (ref 3.5–5.1)
Sodium: 137 mmol/L (ref 135–145)
Total Bilirubin: 0.8 mg/dL (ref <1.2)
Total Protein: 6.1 g/dL — ABNORMAL LOW (ref 6.5–8.1)

## 2023-08-27 LAB — CBC
HCT: 35.5 % — ABNORMAL LOW (ref 36.0–46.0)
Hemoglobin: 11.7 g/dL — ABNORMAL LOW (ref 12.0–15.0)
MCH: 31.5 pg (ref 26.0–34.0)
MCHC: 33 g/dL (ref 30.0–36.0)
MCV: 95.7 fL (ref 80.0–100.0)
Platelets: 254 10*3/uL (ref 150–400)
RBC: 3.71 MIL/uL — ABNORMAL LOW (ref 3.87–5.11)
RDW: 12.9 % (ref 11.5–15.5)
WBC: 4.9 10*3/uL (ref 4.0–10.5)
nRBC: 0 % (ref 0.0–0.2)

## 2023-08-27 SURGERY — ESOPHAGOGASTRODUODENOSCOPY (EGD) WITH PROPOFOL
Anesthesia: Monitor Anesthesia Care

## 2023-08-27 MED ORDER — PANTOPRAZOLE SODIUM 40 MG PO TBEC
40.0000 mg | DELAYED_RELEASE_TABLET | Freq: Two times a day (BID) | ORAL | Status: DC
  Administered 2023-08-27 – 2023-08-31 (×9): 40 mg via ORAL
  Filled 2023-08-27 (×9): qty 1

## 2023-08-27 MED ORDER — SODIUM CHLORIDE 0.9 % IV SOLN: INTRAVENOUS | Status: DC | PRN

## 2023-08-27 MED ORDER — PROPOFOL 10 MG/ML IV BOLUS
INTRAVENOUS | Status: DC | PRN
  Administered 2023-08-27: 50 mg via INTRAVENOUS
  Administered 2023-08-27: 40 mg via INTRAVENOUS
  Administered 2023-08-27: 50 mg via INTRAVENOUS
  Administered 2023-08-27: 40 mg via INTRAVENOUS
  Administered 2023-08-27: 50 mg via INTRAVENOUS
  Administered 2023-08-27 (×2): 30 mg via INTRAVENOUS
  Administered 2023-08-27: 60 mg via INTRAVENOUS
  Administered 2023-08-27: 50 mg via INTRAVENOUS
  Administered 2023-08-27: 30 mg via INTRAVENOUS

## 2023-08-27 MED ORDER — PROCHLORPERAZINE EDISYLATE 10 MG/2ML IJ SOLN
10.0000 mg | Freq: Four times a day (QID) | INTRAMUSCULAR | Status: DC | PRN
  Administered 2023-08-27: 10 mg via INTRAVENOUS
  Filled 2023-08-27: qty 2

## 2023-08-27 MED ORDER — FENTANYL CITRATE (PF) 100 MCG/2ML IJ SOLN
INTRAMUSCULAR | Status: AC
  Filled 2023-08-27: qty 2

## 2023-08-27 MED ORDER — FENTANYL CITRATE (PF) 100 MCG/2ML IJ SOLN
25.0000 ug | Freq: Once | INTRAMUSCULAR | Status: AC
  Administered 2023-08-27: 25 ug via INTRAVENOUS

## 2023-08-27 MED ORDER — GADOBUTROL 1 MMOL/ML IV SOLN
6.0000 mL | Freq: Once | INTRAVENOUS | Status: AC | PRN
  Administered 2023-08-27: 6 mL via INTRAVENOUS

## 2023-08-27 MED ORDER — LIDOCAINE 2% (20 MG/ML) 5 ML SYRINGE
INTRAMUSCULAR | Status: DC | PRN
  Administered 2023-08-27: 80 mg via INTRAVENOUS

## 2023-08-27 SURGICAL SUPPLY — 15 items

## 2023-08-27 NOTE — Care Management (Signed)
Transition of Care [facility]) - Inpatient Brief Assessment   Patient Details  Name: Tiffany Nash MRN: [id number] Date of Birth: [birth year]  [facility] CM/SW Contact:    Lockie Pares, RN Phone Number: 08/27/2023, 3:42 PM   Clinical Narrative:  Presented to ED with abd pain. EGD to be done tomorrow GI consulted. Independent prior to admission.  No needs identified at this time. The patient will be discussed in progressive interdisciplinary rounds. If a need is identified, please place a TOC consult.  Transition of Care Asessment: Insurance and Status: Insurance coverage has been reviewed Patient has primary care physician: Yes Home environment has been reviewed: Lives with Husband Prior level of function:: Independent Prior/Current Home Services: No current home services Social Determinants of Health Reivew: SDOH reviewed no interventions necessary Readmission risk has been reviewed: Yes Transition of care needs: no transition of care needs at this time

## 2023-08-27 NOTE — Progress Notes (Signed)
PROGRESS NOTE    Tiffany Nash  NGE:[id number] DOB: 18-Aug-1961 DOA: 08/25/2023 PCP: Darrow Bussing, MD    Brief Narrative:  Tiffany Nash is a 62 y.o. female with medical history significant of migraine headaches, breast cancer, arthritis, Schatzki's ring, hiatal hernia, and GERD who presents with complaints of abdominal pain which started yesterday evening.  She had had a late lunch eating [place] yesterday afternoon with 1 beer then for dinner she had 2 baby Bell cheeses with water and took a Pepcid as she normally does prior to going to bed.  Within 15 to 20 minutes patient reported having acute onset of epigastric abdominal pain. S/p EGD. Needs MRCP   Assessment and Plan: Pancreatitis/Duodenal diverticulum/Transaminitis -s/p EGD -MRCP pending -GI consulted: -Recommend MRI MRCP for further evaluation of dilated biliary tree as I was not able to visualize ampulla  -Start Protonix 40 mg twice a day -Okay to have full liquid diet today after MRI.  If MRI MRCP cannot be done today, then she can go on soft diet. -May need outpatient surgery evaluation for large diverticulum as this large diverticulum could lead to recurrent issues down the road -GI will follow   Leukocytosis -resolved   Anxiety -Continue Zoloft   Hiatal hernia GERD with esophagitis History of Schatzki's ring Patient has a prior history of Schatzki's ring back in 2006 .  Last barium swallow from 2021 noted small hiatal hernia, mild reflux esophagitis, and mild to moderate esophageal dysmotility thought most likely related to chronic esophagitis.  Followed by [facility] in the past. -s/p EGD: EGD showed large diverticulum in the second portion of the duodenum which was treated with food debris.  Most of the content was removed using a Roth net and rat-tooth.  She had clean-based ulcer in the diverticulum. -I was not able to visualize ampulla properly. -EGD also showed esophagitis and gastritis. -Start Protonix 40  mg twice a day    DVT prophylaxis: enoxaparin (LOVENOX) injection 40 mg Start: 08/26/23 1000    Code Status: Full Code Family Communication:   Disposition Plan:  Level of care: Med-Surg Status is: Inpatient Remains inpatient appropriate     Consultants:  GI   Subjective: No pain  Objective: Vitals:   08/27/23 0850 08/27/23 0900 08/27/23 0910 08/27/23 0919  BP: 115/87 (!) 143/71 135/64 123/66  Pulse: 67 60 (!) 57 (!) 47  Resp: 16 16 16 16   Temp:      TempSrc:      SpO2: 97% 94% 98% 97%  Weight:      Height:        Intake/Output Summary (Last 24 hours) at 08/27/2023 1003 Last data filed at 08/27/2023 4010 Gross per 24 hour  Intake 300 ml  Output --  Net 300 ml   Filed Weights   08/25/23 2326 08/27/23 0745  Weight: 69.9 kg 69.9 kg    Examination:   General: Appearance:    Well developed, well nourished female in no acute distress     Lungs:    respirations unlabored  Heart:    Bradycardic. Normal rhythm. No murmurs, rubs, or gallops.    MS:   All extremities are intact.    Neurologic:   Awake, alert       Data Reviewed: I have personally reviewed following labs and imaging studies  CBC: Recent Labs  Lab 08/26/23 0010 08/27/23 0616  WBC 12.7* 4.9  NEUTROABS 9.8*  --   HGB 12.0 11.7*  HCT 35.9* 35.5*  MCV 94.7  95.7  PLT 285 254   Basic Metabolic Panel: Recent Labs  Lab 08/26/23 0138 08/27/23 0616  NA 135 137  K 3.7 3.9  CL 102 107  CO2 24 22  GLUCOSE 154* 120*  BUN 13 5*  CREATININE 0.83 0.89  CALCIUM 9.4 9.0   GFR: Estimated Creatinine Clearance: 60.6 mL/min (by C-G formula based on SCr of 0.89 mg/dL). Liver Function Tests: Recent Labs  Lab 08/26/23 0138 08/26/23 1259 08/27/23 0616  AST 516* 358* 131*  ALT 288* 418* 266*  ALKPHOS 127* 124 111  BILITOT 0.9 0.4 0.8  PROT 6.7 6.4* 6.1*  ALBUMIN 3.9 3.5 3.4*   Recent Labs  Lab 08/26/23 0138  LIPASE 1,725*   No results for input(s): "AMMONIA" in the last 168  hours. Coagulation Profile: No results for input(s): "INR", "PROTIME" in the last 168 hours. Cardiac Enzymes: No results for input(s): "CKTOTAL", "CKMB", "CKMBINDEX", "TROPONINI" in the last 168 hours. BNP (last 3 results) No results for input(s): "PROBNP" in the last 8760 hours. HbA1C: No results for input(s): "HGBA1C" in the last 72 hours. CBG: No results for input(s): "GLUCAP" in the last 168 hours. Lipid Profile: Recent Labs    08/26/23 1259  CHOL 194  HDL 75  LDLCALC 110*  TRIG 47  CHOLHDL 2.6   Thyroid Function Tests: No results for input(s): "TSH", "T4TOTAL", "FREET4", "T3FREE", "THYROIDAB" in the last 72 hours. Anemia Panel: No results for input(s): "VITAMINB12", "FOLATE", "FERRITIN", "TIBC", "IRON", "RETICCTPCT" in the last 72 hours. Sepsis Labs: Recent Labs  Lab 08/26/23 1259  LATICACIDVEN 2.1*    Recent Results (from the past 240 hour(s))  Culture, blood (single) w Reflex to ID Panel     Status: None (Preliminary result)   Collection Time: 08/26/23 12:59 PM   Specimen: BLOOD RIGHT ARM  Result Value Ref Range Status   Specimen Description BLOOD RIGHT ARM  Final   Special Requests   Final    BOTTLES DRAWN AEROBIC AND ANAEROBIC Blood Culture adequate volume   Culture   Final    NO GROWTH < 24 HOURS Performed at [facility], [address], [place], [place] [address]    Report Status PENDING  Incomplete         Radiology Studies: CT ABDOMEN PELVIS W CONTRAST  Result Date: 08/26/2023 CLINICAL DATA:  62 year old female with history of upper abdominal pain radiating into the central chest. Suspected pancreatitis. EXAM: CT ABDOMEN AND PELVIS WITH CONTRAST TECHNIQUE: Multidetector CT imaging of the abdomen and pelvis was performed using the standard protocol following bolus administration of intravenous contrast. RADIATION DOSE REDUCTION: This exam was performed according to the departmental dose-optimization program which includes automated exposure  control, adjustment of the mA and/or kV according to patient size and/or use of iterative reconstruction technique. CONTRAST:  75mL OMNIPAQUE IOHEXOL 300 MG/ML  SOLN COMPARISON:  CT of the abdomen and pelvis 03/12/2021. FINDINGS: Lower chest: Unremarkable. Hepatobiliary: No suspicious cystic or solid hepatic lesions. Minimal intrahepatic biliary ductal dilatation. Common bile duct is also dilated measuring 11 mm in the porta hepatis, which may simply reflect benign post cholecystectomy physiology. No calcified stone identified in the common bile duct to suggest choledocholithiasis. Pancreas: No pancreatic mass. No pancreatic ductal dilatation. No pancreatic or peripancreatic fluid collections or inflammatory changes. Spleen: Unremarkable. Adrenals/Urinary Tract: Bilateral kidneys and adrenal glands are normal in appearance. No hydroureteronephrosis. Urinary bladder is normal in appearance. Stomach/Bowel: The appearance of the stomach is normal. No pathologic dilatation of small bowel or colon.  However, there is a dilated structure in the right upper quadrant of the abdomen between the duodenal and the common bile duct (axial image 25 of series 3 and coronal image 49 of series 6) which contains a combination of fluid, gas and what appears to be feculent material, likely to represent a large duodenal diverticulum extending off the medial aspect of the second portion of the duodenal. This measures approximately 4.2 x 3.7 x 4.0 cm and exerts local mass effect upon adjacent structures, likely causing some narrowing of the distal common bile duct. Numerous colonic diverticuli are noted, without surrounding inflammatory changes to indicate an acute diverticulitis at this time. Normal appendix. Vascular/Lymphatic: Atherosclerosis in the abdominal aorta and pelvic vasculature. No lymphadenopathy noted in the abdomen or pelvis. Reproductive: Uterus and ovaries are unremarkable in appearance. Other: No significant volume of  ascites.  No pneumoperitoneum. Musculoskeletal: There are no aggressive appearing lytic or blastic lesions noted in the visualized portions of the skeleton. IMPRESSION: 1. While there are no definite acute findings confidently identified in the abdomen or pelvis (specifically, no imaging findings of acute pancreatitis), there is what appears to be a very large duodenal diverticulum extending off the medial aspect of the second portion of the duodenal exerting local mass effect. This may narrow the distal common bile duct and is associated with some mild intra and extrahepatic biliary ductal dilatation. This diverticulum is similar in retrospect compared to the prior examination from 2022, but currently contains what appears to be feculent material. No overt surrounding inflammatory changes to indicate a duodenal diverticulitis at this time. 2. Extensive colonic diverticulosis without evidence of acute colonic diverticulitis at this time. 3. Aortic atherosclerosis. Electronically Signed   By: Trudie Reed M.D.   On: 08/26/2023 05:50   DG Chest Port 1 View  Result Date: 08/26/2023 CLINICAL DATA:  [id number] Epigastric abdominal pain [id number] EXAM: PORTABLE CHEST 1 VIEW COMPARISON:  None Available. FINDINGS: The heart and mediastinal contours are within normal limits. No focal consolidation. No pulmonary edema. No pleural effusion. No pneumothorax. No acute osseous abnormality.  Left axillary vascular clips. IMPRESSION: No active disease. Electronically Signed   By: Tish Frederickson M.D.   On: 08/26/2023 00:27        Scheduled Meds:  enoxaparin (LOVENOX) injection  40 mg Subcutaneous Daily   pantoprazole  40 mg Oral BID   sertraline  50 mg Oral Daily   sodium chloride flush  3 mL Intravenous Q12H   Continuous Infusions:   LOS: 1 day    Time spent: 45 minutes spent on chart review, discussion with nursing staff, consultants, updating family and interview/physical exam; more than 50% of that time was  spent in counseling and/or coordination of care.    Joseph Art, DO [facility] Available via Epic secure chat 7am-7pm After these hours, please refer to coverage provider listed on [email or web address] 08/27/2023, 10:03 AM

## 2023-08-27 NOTE — Interval H&P Note (Signed)
History and Physical Interval Note:  08/27/2023 7:54 AM  Tiffany Nash  has presented today for surgery, with the diagnosis of Abdominal pain, abnormal CT scan.  The various methods of treatment have been discussed with the patient and family. After consideration of risks, benefits and other options for treatment, the patient has consented to  Procedure(s): ESOPHAGOGASTRODUODENOSCOPY (EGD) WITH PROPOFOL (N/A) as a surgical intervention.  The patient's history has been reviewed, patient examined, no change in status, stable for surgery.  I have reviewed the patient's chart and labs.  Questions were answered to the patient's satisfaction.     Mayuri Staples

## 2023-08-27 NOTE — Transfer of Care (Signed)
Immediate Anesthesia Transfer of Care Note  Patient: Tiffany Nash  Procedure(s) Performed: ESOPHAGOGASTRODUODENOSCOPY (EGD) WITH PROPOFOL  Patient Location: PACU and Endoscopy Unit  Anesthesia Type:MAC  Level of Consciousness: drowsy  Airway & Oxygen Therapy: Patient Spontanous Breathing and Patient connected to nasal cannula oxygen  Post-op Assessment: Report given to RN and Post -op Vital signs reviewed and stable  Post vital signs: Reviewed and stable  Last Vitals:  Vitals Value Taken Time  BP 129/79 08/27/23 0840  Temp    Pulse 70 08/27/23 0843  Resp 20 08/27/23 0843  SpO2 98 % 08/27/23 0843  Vitals shown include unfiled device data.  Last Pain:  Vitals:   08/27/23 0745  TempSrc: Temporal  PainSc: 0-No pain         Complications: No notable events documented.

## 2023-08-27 NOTE — Op Note (Signed)
[facility] Patient Name: Tiffany Nash Procedure Date : 08/27/2023 MRN: [id number] Attending MD: Kathi Der , MD, [id number] Date of Birth: 1961/06/13 CSN: [id number] Age: 62 Admit Type: Inpatient Procedure:                Upper GI endoscopy Indications:              Epigastric abdominal pain, Abnormal CT of the GI                            tract Providers:                Kathi Der, MD, Glory Rosebush, RN, Harrington Challenger, Technician Referring MD:              Medicines:                Sedation Administered by an Anesthesia Professional Complications:            No immediate complications. Estimated Blood Loss:     Estimated blood loss was minimal. Procedure:                Pre-Anesthesia Assessment:                           - Prior to the procedure, a History and Physical                            was performed, and patient medications and                            allergies were reviewed. The patient's tolerance of                            previous anesthesia was also reviewed. The risks                            and benefits of the procedure and the sedation                            options and risks were discussed with the patient.                            All questions were answered, and informed consent                            was obtained. Prior Anticoagulants: The patient has                            taken no anticoagulant or antiplatelet agents. ASA                            Grade Assessment: II - A patient with mild systemic  disease. After reviewing the risks and benefits,                            the patient was deemed in satisfactory condition to                            undergo the procedure.                           After obtaining informed consent, the endoscope was                            passed under direct vision. Throughout the                             procedure, the patient's blood pressure, pulse, and                            oxygen saturations were monitored continuously. The                            [id number]-[id number] ([id number]) Olympus endoscope was introduced                            through the mouth, and advanced to the second part                            of duodenum. The upper GI endoscopy was                            accomplished without difficulty. The patient                            tolerated the procedure well. Scope In: Scope Out: Findings:      Mucosal changes including ringed esophagus were found in the middle       third of the esophagus and in the lower third of the esophagus. Biopsies       were obtained from the proximal and distal esophagus with cold forceps       for histology of suspected eosinophilic esophagitis.      A non-obstructing Schatzki ring was found at the gastroesophageal       junction.      A small hiatal hernia was present.      Scattered mild inflammation characterized by congestion (edema),       erosions and erythema was found in the entire examined stomach. Biopsies       were taken with a cold forceps for histology.      The cardia and gastric fundus were normal on retroflexion.      A 40 mm non-bleeding diverticulum was found in the second portion of the       duodenum.      Food (residue) was found in the second portion of the duodenum in the       diverticulum. Removal was accomplished with a Raptor grasping device and       Roth net. There was a small  superficial ulcer in the diverticulum. Impression:               - Esophageal mucosal changes suggestive of                            eosinophilic esophagitis.                           - Non-obstructing Schatzki ring.                           - Small hiatal hernia.                           - Gastritis. Biopsied.                           - Non-bleeding duodenal diverticulum with ulcer.                           - Retained food in  the duodenum. Removal was                            successful.                           - Biopsies were taken with a cold forceps for                            evaluation of eosinophilic esophagitis. Recommendation:           - Return patient to hospital ward for ongoing care.                           - Soft diet.                           - Continue present medications.                           - Await pathology results.                           - Refer to a surgeon at appointment to be scheduled.                           - Use Protonix (pantoprazole) 40 mg PO BID for 4                            weeks. Procedure Code(s):        --- Professional ---                           [id number], Esophagogastroduodenoscopy, flexible,                            transoral; with removal of foreign body(s)  [address], Esophagogastroduodenoscopy, flexible,                            transoral; with biopsy, single or multiple Diagnosis Code(s):        --- Professional ---                           K22.89, Other specified disease of esophagus                           K22.2, Esophageal obstruction                           K44.9, Diaphragmatic hernia without obstruction or                            gangrene                           K29.70, Gastritis, unspecified, without bleeding                           T18.3XXA, Foreign body in small intestine, initial                            encounter                           R10.13, Epigastric pain                           K57.10, Diverticulosis of small intestine without                            perforation or abscess without bleeding                           R93.3, Abnormal findings on diagnostic imaging of                            other parts of digestive tract CPT copyright 2022 American Medical Association. All rights reserved. The codes documented in this report are preliminary and upon coder review may  be revised to  meet current compliance requirements. Kathi Der, MD Kathi Der, MD 08/27/2023 8:46:20 AM Number of Addenda: 0

## 2023-08-27 NOTE — Brief Op Note (Signed)
08/27/2023  9:08 AM  PATIENT:  Tiffany Nash  62 y.o. female  PRE-OPERATIVE DIAGNOSIS:  Abdominal pain, abnormal CT scan  POST-OPERATIVE DIAGNOSIS:  large diverticulum casuing obstructive jaundice. gastritis.  esophageal biopsy for EOE and gastric biopsy for HP  PROCEDURE:  Procedure(s): ESOPHAGOGASTRODUODENOSCOPY (EGD) WITH PROPOFOL (N/A)  SURGEON:  Surgeons and Role:    * Vaness Jelinski, MD - Primary  Findings ---------- -EGD showed large diverticulum in the second portion of the duodenum which was treated with food debris.  Most of the content was removed using a Roth net and rat-tooth.  She had clean-based ulcer in the diverticulum. -I was not able to visualize ampulla properly. -EGD also showed esophagitis and gastritis.  Recommendations -------------------------- -Recommend MRI MRCP for further evaluation of dilated biliary tree as I was not able to visualize ampulla  -Start Protonix 40 mg twice a day -Okay to have full liquid diet today after MRI.  If MRI MRCP cannot be done today, then she can go on soft diet. -May need outpatient surgery evaluation for large diverticulum as this large diverticulum could lead to recurrent issues down the road -GI will follow  Kathi Der MD, FACP 08/27/2023, 9:10 AM  Contact #  [phone]

## 2023-08-28 DIAGNOSIS — K851 Biliary acute pancreatitis without necrosis or infection: Secondary | ICD-10-CM | POA: Diagnosis not present

## 2023-08-28 DIAGNOSIS — K859 Acute pancreatitis without necrosis or infection, unspecified: Secondary | ICD-10-CM | POA: Diagnosis not present

## 2023-08-28 LAB — CBC
HCT: 33.7 % — ABNORMAL LOW (ref 36.0–46.0)
Hemoglobin: 11.5 g/dL — ABNORMAL LOW (ref 12.0–15.0)
MCH: 32.2 pg (ref 26.0–34.0)
MCHC: 34.1 g/dL (ref 30.0–36.0)
MCV: 94.4 fL (ref 80.0–100.0)
Platelets: 257 10*3/uL (ref 150–400)
RBC: 3.57 MIL/uL — ABNORMAL LOW (ref 3.87–5.11)
RDW: 13 % (ref 11.5–15.5)
WBC: 9.8 10*3/uL (ref 4.0–10.5)
nRBC: 0 % (ref 0.0–0.2)

## 2023-08-28 LAB — COMPREHENSIVE METABOLIC PANEL
ALT: 206 U/L — ABNORMAL HIGH (ref 0–44)
AST: 109 U/L — ABNORMAL HIGH (ref 15–41)
Albumin: 3.1 g/dL — ABNORMAL LOW (ref 3.5–5.0)
Alkaline Phosphatase: 95 U/L (ref 38–126)
Anion gap: 8 (ref 5–15)
BUN: 7 mg/dL — ABNORMAL LOW (ref 8–23)
CO2: 23 mmol/L (ref 22–32)
Calcium: 8.5 mg/dL — ABNORMAL LOW (ref 8.9–10.3)
Chloride: 103 mmol/L (ref 98–111)
Creatinine, Ser: 0.77 mg/dL (ref 0.44–1.00)
GFR, Estimated: >60 mL/min (ref >60)
Glucose, Bld: 142 mg/dL — ABNORMAL HIGH (ref 70–99)
Potassium: 3.5 mmol/L (ref 3.5–5.1)
Sodium: 134 mmol/L — ABNORMAL LOW (ref 135–145)
Total Bilirubin: 0.9 mg/dL (ref <1.2)
Total Protein: 5.7 g/dL — ABNORMAL LOW (ref 6.5–8.1)

## 2023-08-28 MED ORDER — METRONIDAZOLE 500 MG/100ML IV SOLN
500.0000 mg | Freq: Two times a day (BID) | INTRAVENOUS | Status: DC
  Administered 2023-08-28 – 2023-08-31 (×7): 500 mg via INTRAVENOUS
  Filled 2023-08-28 (×7): qty 100

## 2023-08-28 MED ORDER — SODIUM CHLORIDE 0.9 % IV SOLN
2.0000 g | INTRAVENOUS | Status: DC
  Administered 2023-08-28 – 2023-08-30 (×3): 2 g via INTRAVENOUS
  Filled 2023-08-28 (×3): qty 20

## 2023-08-28 MED ORDER — ACETAMINOPHEN 325 MG PO TABS
650.0000 mg | ORAL_TABLET | Freq: Four times a day (QID) | ORAL | Status: DC | PRN
  Administered 2023-08-28: 650 mg via ORAL
  Filled 2023-08-28: qty 2

## 2023-08-28 MED ORDER — SODIUM CHLORIDE 0.9 % IV SOLN: INTRAVENOUS | Status: DC

## 2023-08-28 MED ORDER — TRAMADOL HCL 50 MG PO TABS: 50.0000 mg | ORAL_TABLET | Freq: Once | ORAL | Status: DC

## 2023-08-28 MED ORDER — TRAMADOL HCL 50 MG PO TABS
50.0000 mg | ORAL_TABLET | Freq: Four times a day (QID) | ORAL | Status: DC | PRN
  Administered 2023-08-28 (×2): 100 mg via ORAL
  Administered 2023-08-29: 50 mg via ORAL
  Administered 2023-08-29 – 2023-08-30 (×3): 100 mg via ORAL
  Filled 2023-08-28 (×6): qty 2

## 2023-08-28 MED ORDER — FENTANYL CITRATE PF 50 MCG/ML IJ SOSY
12.5000 ug | PREFILLED_SYRINGE | INTRAMUSCULAR | Status: DC | PRN
  Administered 2023-08-29 (×4): 25 ug via INTRAVENOUS
  Filled 2023-08-28 (×4): qty 1

## 2023-08-28 NOTE — Plan of Care (Signed)
  Problem: Education: Goal: Knowledge of General Education information will improve Description: Including pain rating scale, medication(s)/side effects and non-pharmacologic comfort measures Outcome: Progressing   Problem: Health Behavior/Discharge Planning: Goal: Ability to manage health-related needs will improve Outcome: Progressing   Problem: Skin Integrity: Goal: Risk for impaired skin integrity will decrease Outcome: Progressing   

## 2023-08-28 NOTE — Progress Notes (Signed)
Subjective: Improving upper/RUQ abdominal pain. Temp 100.8 within past 24 hours.  Objective: Vital signs in last 24 hours: Temp:  [99.6 F (37.6 C)-100.8 F (38.2 C)] 99.6 F (37.6 C) (11/16 1142) Pulse Rate:  [82-93] 87 (11/16 0551) Resp:  [16-18] 18 (11/16 0551) BP: (102-119)/(57-62) 102/58 (11/16 0551) SpO2:  [91 %-95 %] 91 % (11/16 0551) Weight change:  Last BM Date : 08/28/23  PE: GEN:  NAD ABD:  Soft, mild upper abdominal tenderness without peritonitis NEURO:  No encephalopathy  Lab Results: CBC    Component Value Date/Time   WBC 9.8 08/28/2023 0356   RBC 3.57 (L) 08/28/2023 0356   HGB 11.5 (L) 08/28/2023 0356   HGB 13.3 07/19/2023 1336   HGB 12.9 07/01/2017 1519   HGB 13.1 02/26/2017 1447   HCT 33.7 (L) 08/28/2023 0356   HCT 38.0 07/01/2017 1519   HCT 38.8 02/26/2017 1447   PLT 257 08/28/2023 0356   PLT 271 07/19/2023 1336   PLT 320 07/01/2017 1519   MCV 94.4 08/28/2023 0356   MCV 93 07/01/2017 1519   MCV 93.6 02/26/2017 1447   MCH 32.2 08/28/2023 0356   MCHC 34.1 08/28/2023 0356   RDW 13.0 08/28/2023 0356   RDW 13.2 07/01/2017 1519   RDW 12.8 02/26/2017 1447   LYMPHSABS 1.7 08/26/2023 0010   LYMPHSABS 1.6 07/01/2017 1519   LYMPHSABS 1.9 02/26/2017 1447   MONOABS 0.8 08/26/2023 0010   MONOABS 0.6 02/26/2017 1447   EOSABS 0.4 08/26/2023 0010   EOSABS 0.2 07/01/2017 1519   BASOSABS 0.1 08/26/2023 0010   BASOSABS 0.0 07/01/2017 1519   BASOSABS 0.0 02/26/2017 1447  CMP     Component Value Date/Time   NA 134 (L) 08/28/2023 0356   NA 144 07/01/2017 1519   NA 139 02/26/2017 1447   K 3.5 08/28/2023 0356   K 4.0 02/26/2017 1447   CL 103 08/28/2023 0356   CO2 23 08/28/2023 0356   CO2 27 02/26/2017 1447   GLUCOSE 142 (H) 08/28/2023 0356   GLUCOSE 110 02/26/2017 1447   BUN 7 (L) 08/28/2023 0356   BUN 14 07/01/2017 1519   BUN 19.0 02/26/2017 1447   CREATININE 0.77 08/28/2023 0356   CREATININE 0.74 07/19/2023 1336   CREATININE 0.8 02/26/2017 1447    CALCIUM 8.5 (L) 08/28/2023 0356   CALCIUM 10.1 02/26/2017 1447   PROT 5.7 (L) 08/28/2023 0356   PROT 7.4 07/01/2017 1519   PROT 7.4 02/26/2017 1447   ALBUMIN 3.1 (L) 08/28/2023 0356   ALBUMIN 4.9 07/01/2017 1519   ALBUMIN 4.5 02/26/2017 1447   AST 109 (H) 08/28/2023 0356   AST 15 07/19/2023 1336   AST 21 02/26/2017 1447   ALT 206 (H) 08/28/2023 0356   ALT 14 07/19/2023 1336   ALT 21 02/26/2017 1447   ALKPHOS 95 08/28/2023 0356   ALKPHOS 73 02/26/2017 1447   BILITOT 0.9 08/28/2023 0356   BILITOT 0.4 07/19/2023 1336   BILITOT 0.32 02/26/2017 1447   EGFR 82 (L) 02/26/2017 1447   GFRNONAA >60 08/28/2023 0356   GFRNONAA >60 07/19/2023 1336    Studies/Results: MRI/MRCP  Possible diverticulitis/pancreatitis in setting of 2.6 cm periampullary diverticulum; prominent CBD.  Assessment:  Possible periampullary diverticulitis. Prominent CBD likely pressure effect from food/debris in diverticulum (in part removed yesterday during endoscopy). Low grade fevers.  Plan:   Agree antibiotics. Slowly advance diet. Follow CBC and LFTs. Do not feel any further GI procedures needed at this time. Hopefully home in the next couple days.  Freddy Jaksch 08/28/2023, 12:11 PM   Cell [phone] If no answer or after 5 PM call [phone]

## 2023-08-28 NOTE — Progress Notes (Signed)
PROGRESS NOTE    Tiffany Nash  ZOX:[id number] DOB: 04/06/1961 DOA: 08/25/2023 PCP: Darrow Bussing, MD    Brief Narrative:  Tiffany Nash is a 62 y.o. female with medical history significant of migraine headaches, breast cancer, arthritis, Schatzki's ring, hiatal hernia, and GERD who presents with complaints of abdominal pain which started yesterday evening.  She had had a late lunch eating [place] yesterday afternoon with 1 beer then for dinner she had 2 baby Bell cheeses with water and took a Pepcid as she normally does prior to going to bed.  Within 15 to 20 minutes patient reported having acute onset of epigastric abdominal pain. S/p EGD. S/p MRCP   Assessment and Plan: Pancreatitis/Duodenal diverticulitis/Transaminitis -s/p EGD -MRCP done--- pancreatitis?  Diverticulitis? -GI consulted -IV abx -Start Protonix 40 mg twice a day   Leukocytosis -resolved   Anxiety -Continue Zoloft   Hiatal hernia GERD with esophagitis History of Schatzki's ring Patient has a prior history of Schatzki's ring back in 2006 .  Last barium swallow from 2021 noted small hiatal hernia, mild reflux esophagitis, and mild to moderate esophageal dysmotility thought most likely related to chronic esophagitis.  Followed by [facility] in the past. -s/p EGD: EGD showed large diverticulum in the second portion of the duodenum which was treated with food debris.  Most of the content was removed using a Roth net and rat-tooth.  She had clean-based ulcer in the diverticulum. -I was not able to visualize ampulla properly. -EGD also showed esophagitis and gastritis. -Start Protonix 40 mg twice a day    DVT prophylaxis: enoxaparin (LOVENOX) injection 40 mg Start: 08/26/23 1000    Code Status: Full Code Family Communication: husband at bedside  Disposition Plan:  Level of care: Med-Surg Status is: Inpatient Remains inpatient appropriate     Consultants:  GI   Subjective: So right sided  pain  Objective: Vitals:   08/27/23 2109 08/27/23 2147 08/28/23 0551 08/28/23 1142  BP: (!) 106/58 (!) 119/57 (!) 102/58   Pulse: 93  87   Resp: 18  18   Temp: (!) 100.8 F (38.2 C) 100 F (37.8 C) 100.2 F (37.9 C) 99.6 F (37.6 C)  TempSrc: Oral Oral Oral Oral  SpO2: 95%  91%   Weight:      Height:       No intake or output data in the 24 hours ending 08/28/23 1221  Filed Weights   08/25/23 2326 08/27/23 0745  Weight: 69.9 kg 69.9 kg    Examination:    General: Appearance:    Well developed, well nourished female in no acute distress     Lungs:     respirations unlabored  Heart:    Normal heart rate.   MS:   All extremities are intact.   Neurologic:   Awake, alert       Data Reviewed: I have personally reviewed following labs and imaging studies  CBC: Recent Labs  Lab 08/26/23 0010 08/27/23 0616 08/28/23 0356  WBC 12.7* 4.9 9.8  NEUTROABS 9.8*  --   --   HGB 12.0 11.7* 11.5*  HCT 35.9* 35.5* 33.7*  MCV 94.7 95.7 94.4  PLT 285 254 257   Basic Metabolic Panel: Recent Labs  Lab 08/26/23 0138 08/27/23 0616 08/28/23 0356  NA 135 137 134*  K 3.7 3.9 3.5  CL 102 107 103  CO2 24 22 23   GLUCOSE 154* 120* 142*  BUN 13 5* 7*  CREATININE 0.83 0.89 0.77  CALCIUM 9.4  9.0 8.5*   GFR: Estimated Creatinine Clearance: 67.4 mL/min (by C-G formula based on SCr of 0.77 mg/dL). Liver Function Tests: Recent Labs  Lab 08/26/23 0138 08/26/23 1259 08/27/23 0616 08/28/23 0356  AST 516* 358* 131* 109*  ALT 288* 418* 266* 206*  ALKPHOS 127* 124 111 95  BILITOT 0.9 0.4 0.8 0.9  PROT 6.7 6.4* 6.1* 5.7*  ALBUMIN 3.9 3.5 3.4* 3.1*   Recent Labs  Lab 08/26/23 0138  LIPASE 1,725*   No results for input(s): "AMMONIA" in the last 168 hours. Coagulation Profile: No results for input(s): "INR", "PROTIME" in the last 168 hours. Cardiac Enzymes: No results for input(s): "CKTOTAL", "CKMB", "CKMBINDEX", "TROPONINI" in the last 168 hours. BNP (last 3 results) No  results for input(s): "PROBNP" in the last 8760 hours. HbA1C: No results for input(s): "HGBA1C" in the last 72 hours. CBG: No results for input(s): "GLUCAP" in the last 168 hours. Lipid Profile: Recent Labs    08/26/23 1259  CHOL 194  HDL 75  LDLCALC 110*  TRIG 47  CHOLHDL 2.6   Thyroid Function Tests: No results for input(s): "TSH", "T4TOTAL", "FREET4", "T3FREE", "THYROIDAB" in the last 72 hours. Anemia Panel: No results for input(s): "VITAMINB12", "FOLATE", "FERRITIN", "TIBC", "IRON", "RETICCTPCT" in the last 72 hours. Sepsis Labs: Recent Labs  Lab 08/26/23 1259  LATICACIDVEN 2.1*    Recent Results (from the past 240 hour(s))  Culture, blood (single) w Reflex to ID Panel     Status: None (Preliminary result)   Collection Time: 08/26/23 12:59 PM   Specimen: BLOOD RIGHT ARM  Result Value Ref Range Status   Specimen Description BLOOD RIGHT ARM  Final   Special Requests   Final    BOTTLES DRAWN AEROBIC AND ANAEROBIC Blood Culture adequate volume   Culture   Final    NO GROWTH 2 DAYS Performed at [facility], [address], [place], [place] [address]    Report Status PENDING  Incomplete         Radiology Studies: MR ABDOMEN MRCP W WO CONTAST  Result Date: 08/27/2023 CLINICAL DATA:  Upper abdominal pain. Jaundice. Leukocytosis. Biliary ductal dilatation. Prior cholecystectomy. EXAM: MRI ABDOMEN WITHOUT AND WITH CONTRAST (INCLUDING MRCP) TECHNIQUE: Multiplanar multisequence MR imaging of the abdomen was performed both before and after the administration of intravenous contrast. Heavily T2-weighted images of the biliary and pancreatic ducts were obtained, and three-dimensional MRCP images were rendered by post processing. CONTRAST:  6mL GADAVIST GADOBUTROL 1 MMOL/ML IV SOLN COMPARISON:  CT on 08/26/2023 FINDINGS: Lower chest: No acute findings. Hepatobiliary: No hepatic masses identified. Prior cholecystectomy noted. Mild diffuse biliary ductal dilatation is seen to  the level of the ampulla. No evidence of choledocholithiasis. Narrowing of the distal common bile duct is seen at the ampulla adjacent to the site of the periampullary duodenal diverticulum described below. Pancreas: No evidence of pancreatic mass or ductal dilatation. No evidence of pancreas divisum. Moderate edema is seen adjacent to the pancreatic head and surrounding a periampullary duodenum diverticulum described below. This may be due to pancreatitis or duodenum diverticulitis. Spleen:  Within normal limits in size and appearance. Adrenals/Urinary Tract: No suspicious masses identified. No evidence of hydronephrosis. Stomach/Bowel: A diverticulum is seen arising from the periampullary descending duodenum which measures approximately 2.6 cm in diameter. Moderate surrounding edema is seen, which also surrounds the pancreatic head. Other visualized bowel loops are unremarkable. Vascular/Lymphatic: No pathologically enlarged lymph nodes identified. No acute vascular findings. Other:  None. Musculoskeletal:  No suspicious bone  lesions identified. IMPRESSION: 2.6 cm periampullary duodenum diverticulum with surrounding inflammation which also surrounds the pancreatic head. Differential diagnosis includes duodenum diverticulitis and pancreatitis. Recommend correlation with pancreatic enzymes. Mild diffuse biliary ductal dilatation to the level of the ampulla, where there is narrowing at the site of the inflammation surrounding the periampullary duodenum diverticulum and pancreatic head. No evidence of choledocholithiasis. Electronically Signed   By: Danae Orleans M.D.   On: 08/27/2023 17:32   MR 3D Recon At Scanner  Result Date: 08/27/2023 CLINICAL DATA:  Upper abdominal pain. Jaundice. Leukocytosis. Biliary ductal dilatation. Prior cholecystectomy. EXAM: MRI ABDOMEN WITHOUT AND WITH CONTRAST (INCLUDING MRCP) TECHNIQUE: Multiplanar multisequence MR imaging of the abdomen was performed both before and after the  administration of intravenous contrast. Heavily T2-weighted images of the biliary and pancreatic ducts were obtained, and three-dimensional MRCP images were rendered by post processing. CONTRAST:  6mL GADAVIST GADOBUTROL 1 MMOL/ML IV SOLN COMPARISON:  CT on 08/26/2023 FINDINGS: Lower chest: No acute findings. Hepatobiliary: No hepatic masses identified. Prior cholecystectomy noted. Mild diffuse biliary ductal dilatation is seen to the level of the ampulla. No evidence of choledocholithiasis. Narrowing of the distal common bile duct is seen at the ampulla adjacent to the site of the periampullary duodenal diverticulum described below. Pancreas: No evidence of pancreatic mass or ductal dilatation. No evidence of pancreas divisum. Moderate edema is seen adjacent to the pancreatic head and surrounding a periampullary duodenum diverticulum described below. This may be due to pancreatitis or duodenum diverticulitis. Spleen:  Within normal limits in size and appearance. Adrenals/Urinary Tract: No suspicious masses identified. No evidence of hydronephrosis. Stomach/Bowel: A diverticulum is seen arising from the periampullary descending duodenum which measures approximately 2.6 cm in diameter. Moderate surrounding edema is seen, which also surrounds the pancreatic head. Other visualized bowel loops are unremarkable. Vascular/Lymphatic: No pathologically enlarged lymph nodes identified. No acute vascular findings. Other:  None. Musculoskeletal:  No suspicious bone lesions identified. IMPRESSION: 2.6 cm periampullary duodenum diverticulum with surrounding inflammation which also surrounds the pancreatic head. Differential diagnosis includes duodenum diverticulitis and pancreatitis. Recommend correlation with pancreatic enzymes. Mild diffuse biliary ductal dilatation to the level of the ampulla, where there is narrowing at the site of the inflammation surrounding the periampullary duodenum diverticulum and pancreatic head. No  evidence of choledocholithiasis. Electronically Signed   By: Danae Orleans M.D.   On: 08/27/2023 17:32        Scheduled Meds:  enoxaparin (LOVENOX) injection  40 mg Subcutaneous Daily   pantoprazole  40 mg Oral BID   sertraline  50 mg Oral Daily   sodium chloride flush  3 mL Intravenous Q12H   Continuous Infusions:  sodium chloride 125 mL/hr at 08/28/23 0936   cefTRIAXone (ROCEPHIN)  IV     metronidazole 500 mg (08/28/23 1156)     LOS: 2 days    Time spent: 45 minutes spent on chart review, discussion with nursing staff, consultants, updating family and interview/physical exam; more than 50% of that time was spent in counseling and/or coordination of care.    Joseph Art, DO [facility] Available via Epic secure chat 7am-7pm After these hours, please refer to coverage provider listed on [email or web address] 08/28/2023, 12:21 PM

## 2023-08-29 ENCOUNTER — Inpatient Hospital Stay (HOSPITAL_COMMUNITY): Payer: BC Managed Care – PPO

## 2023-08-29 DIAGNOSIS — K263 Acute duodenal ulcer without hemorrhage or perforation: Secondary | ICD-10-CM | POA: Diagnosis not present

## 2023-08-29 DIAGNOSIS — K851 Biliary acute pancreatitis without necrosis or infection: Secondary | ICD-10-CM | POA: Diagnosis not present

## 2023-08-29 LAB — CBC
HCT: 30.8 % — ABNORMAL LOW (ref 36.0–46.0)
Hemoglobin: 10.3 g/dL — ABNORMAL LOW (ref 12.0–15.0)
MCH: 31.9 pg (ref 26.0–34.0)
MCHC: 33.4 g/dL (ref 30.0–36.0)
MCV: 95.4 fL (ref 80.0–100.0)
Platelets: 219 10*3/uL (ref 150–400)
RBC: 3.23 MIL/uL — ABNORMAL LOW (ref 3.87–5.11)
RDW: 12.9 % (ref 11.5–15.5)
WBC: 10.1 10*3/uL (ref 4.0–10.5)
nRBC: 0 % (ref 0.0–0.2)

## 2023-08-29 LAB — COMPREHENSIVE METABOLIC PANEL
ALT: 169 U/L — ABNORMAL HIGH (ref 0–44)
AST: 59 U/L — ABNORMAL HIGH (ref 15–41)
Albumin: 2.7 g/dL — ABNORMAL LOW (ref 3.5–5.0)
Alkaline Phosphatase: 123 U/L (ref 38–126)
Anion gap: 7 (ref 5–15)
BUN: <5 mg/dL — ABNORMAL LOW (ref 8–23)
CO2: 24 mmol/L (ref 22–32)
Calcium: 8.1 mg/dL — ABNORMAL LOW (ref 8.9–10.3)
Chloride: 103 mmol/L (ref 98–111)
Creatinine, Ser: 0.68 mg/dL (ref 0.44–1.00)
GFR, Estimated: >60 mL/min (ref >60)
Glucose, Bld: 156 mg/dL — ABNORMAL HIGH (ref 70–99)
Potassium: 3.7 mmol/L (ref 3.5–5.1)
Sodium: 134 mmol/L — ABNORMAL LOW (ref 135–145)
Total Bilirubin: 0.8 mg/dL (ref <1.2)
Total Protein: 5.5 g/dL — ABNORMAL LOW (ref 6.5–8.1)

## 2023-08-29 MED ORDER — POLYETHYLENE GLYCOL 3350 17 G PO PACK
17.0000 g | PACK | Freq: Two times a day (BID) | ORAL | Status: DC
  Administered 2023-08-29 – 2023-08-30 (×4): 17 g via ORAL
  Filled 2023-08-29 (×4): qty 1

## 2023-08-29 MED ORDER — ACETAMINOPHEN 500 MG PO TABS
1000.0000 mg | ORAL_TABLET | Freq: Four times a day (QID) | ORAL | Status: DC
  Administered 2023-08-29 – 2023-08-31 (×8): 1000 mg via ORAL
  Filled 2023-08-29 (×8): qty 2

## 2023-08-29 NOTE — Plan of Care (Signed)
  Problem: Education: Goal: Knowledge of General Education information will improve Description: Including pain rating scale, medication(s)/side effects and non-pharmacologic comfort measures Outcome: Progressing   Problem: Health Behavior/Discharge Planning: Goal: Ability to manage health-related needs will improve Outcome: Progressing   Problem: Skin Integrity: Goal: Risk for impaired skin integrity will decrease Outcome: Progressing   

## 2023-08-29 NOTE — Progress Notes (Addendum)
PROGRESS NOTE    Tiffany Nash  NWG:[id number] DOB: 27-Sep-1961 DOA: 08/25/2023 PCP: Darrow Bussing, MD    Brief Narrative:  Tiffany Nash is a 62 y.o. female with medical history significant of migraine headaches, breast cancer, arthritis, Schatzki's ring, hiatal hernia, and GERD who presents with complaints of abdominal pain which started yesterday evening.  She had had a late lunch eating [place] yesterday afternoon with 1 beer then for dinner she had 2 baby Bell cheeses with water and took a Pepcid as she normally does prior to going to bed.  Within 15 to 20 minutes patient reported having acute onset of epigastric abdominal pain. S/p EGD. S/p MRCP   Assessment and Plan: Pancreatitis/Duodenal diverticulitis/Transaminitis -s/p EGD: large diverticulum with ulcer -MRCP done--- pancreatitis?  Diverticulitis? -GI consulted -IV abx -Start Protonix 40 mg twice a day   Leukocytosis -resolved   Anxiety -Continue Zoloft   Hiatal hernia GERD with esophagitis History of Schatzki's ring Patient has a prior history of Schatzki's ring back in 2006 .  Last barium swallow from 2021 noted small hiatal hernia, mild reflux esophagitis, and mild to moderate esophageal dysmotility thought most likely related to chronic esophagitis.  Followed [facility] in the past. -s/p EGD: EGD showed large diverticulum in the second portion of the duodenum which was treated with food debris.  Most of the content was removed using a Roth net and rat-tooth.  She had clean-based ulcer in the diverticulum. -I was not able to visualize ampulla properly. -EGD also showed esophagitis and gastritis. -Start Protonix 40 mg twice a day   ? Ileus -check x ray -ambulate -miralax  -if x ray negative will advance diet  DVT prophylaxis: enoxaparin (LOVENOX) injection 40 mg Start: 08/26/23 1000    Code Status: Full Code Family Communication: husband at bedside  Disposition Plan:  Level of care: Med-Surg Status  is: Inpatient Remains inpatient appropriate     Consultants:  GI   Subjective: Still with right side pain worse with breathing  Objective: Vitals:   08/28/23 1733 08/28/23 2032 08/29/23 0548 08/29/23 0730  BP: (!) 121/53 124/62 129/65 120/68  Pulse:  88 97 97  Resp: 17 18 18 16   Temp: (!) 100.7 F (38.2 C) 99 F (37.2 C) 100 F (37.8 C) 99.4 F (37.4 C)  TempSrc: Oral Oral Oral Oral  SpO2: 98% 93% (!) 83% 90%  Weight:      Height:        Intake/Output Summary (Last 24 hours) at 08/29/2023 0912 Last data filed at 08/29/2023 0410 Gross per 24 hour  Intake 3250.24 ml  Output --  Net 3250.24 ml    Filed Weights   08/25/23 2326 08/27/23 0745  Weight: 69.9 kg 69.9 kg    Examination:   General: Appearance:    Well developed, well nourished female who appears uncomfortable     Lungs:    respirations unlabored  Heart:    Normal heart rate.    MS:   All extremities are intact.   Neurologic:   Awake, alert       Data Reviewed: I have personally reviewed following labs and imaging studies  CBC: Recent Labs  Lab 08/26/23 0010 08/27/23 0616 08/28/23 0356 08/29/23 0526  WBC 12.7* 4.9 9.8 10.1  NEUTROABS 9.8*  --   --   --   HGB 12.0 11.7* 11.5* 10.3*  HCT 35.9* 35.5* 33.7* 30.8*  MCV 94.7 95.7 94.4 95.4  PLT 285 254 257 219   Basic  Metabolic Panel: Recent Labs  Lab 08/26/23 0138 08/27/23 0616 08/28/23 0356 08/29/23 0526  NA 135 137 134* 134*  K 3.7 3.9 3.5 3.7  CL 102 107 103 103  CO2 24 22 23 24   GLUCOSE 154* 120* 142* 156*  BUN 13 5* 7* <5*  CREATININE 0.83 0.89 0.77 0.68  CALCIUM 9.4 9.0 8.5* 8.1*   GFR: Estimated Creatinine Clearance: 67.4 mL/min (by C-G formula based on SCr of 0.68 mg/dL). Liver Function Tests: Recent Labs  Lab 08/26/23 0138 08/26/23 1259 08/27/23 0616 08/28/23 0356 08/29/23 0526  AST 516* 358* 131* 109* 59*  ALT 288* 418* 266* 206* 169*  ALKPHOS 127* 124 111 95 123  BILITOT 0.9 0.4 0.8 0.9 0.8  PROT 6.7 6.4*  6.1* 5.7* 5.5*  ALBUMIN 3.9 3.5 3.4* 3.1* 2.7*   Recent Labs  Lab 08/26/23 0138  LIPASE 1,725*   No results for input(s): "AMMONIA" in the last 168 hours. Coagulation Profile: No results for input(s): "INR", "PROTIME" in the last 168 hours. Cardiac Enzymes: No results for input(s): "CKTOTAL", "CKMB", "CKMBINDEX", "TROPONINI" in the last 168 hours. BNP (last 3 results) No results for input(s): "PROBNP" in the last 8760 hours. HbA1C: No results for input(s): "HGBA1C" in the last 72 hours. CBG: No results for input(s): "GLUCAP" in the last 168 hours. Lipid Profile: Recent Labs    08/26/23 1259  CHOL 194  HDL 75  LDLCALC 110*  TRIG 47  CHOLHDL 2.6   Thyroid Function Tests: No results for input(s): "TSH", "T4TOTAL", "FREET4", "T3FREE", "THYROIDAB" in the last 72 hours. Anemia Panel: No results for input(s): "VITAMINB12", "FOLATE", "FERRITIN", "TIBC", "IRON", "RETICCTPCT" in the last 72 hours. Sepsis Labs: Recent Labs  Lab 08/26/23 1259  LATICACIDVEN 2.1*    Recent Results (from the past 240 hour(s))  Culture, blood (single) w Reflex to ID Panel     Status: None (Preliminary result)   Collection Time: 08/26/23 12:59 PM   Specimen: BLOOD RIGHT ARM  Result Value Ref Range Status   Specimen Description BLOOD RIGHT ARM  Final   Special Requests   Final    BOTTLES DRAWN AEROBIC AND ANAEROBIC Blood Culture adequate volume   Culture   Final    NO GROWTH 2 DAYS Performed at [facility], [address], [place], [place] [address]    Report Status PENDING  Incomplete         Radiology Studies: MR ABDOMEN MRCP W WO CONTAST  Result Date: 08/27/2023 CLINICAL DATA:  Upper abdominal pain. Jaundice. Leukocytosis. Biliary ductal dilatation. Prior cholecystectomy. EXAM: MRI ABDOMEN WITHOUT AND WITH CONTRAST (INCLUDING MRCP) TECHNIQUE: Multiplanar multisequence MR imaging of the abdomen was performed both before and after the administration of intravenous contrast.  Heavily T2-weighted images of the biliary and pancreatic ducts were obtained, and three-dimensional MRCP images were rendered by post processing. CONTRAST:  6mL GADAVIST GADOBUTROL 1 MMOL/ML IV SOLN COMPARISON:  CT on 08/26/2023 FINDINGS: Lower chest: No acute findings. Hepatobiliary: No hepatic masses identified. Prior cholecystectomy noted. Mild diffuse biliary ductal dilatation is seen to the level of the ampulla. No evidence of choledocholithiasis. Narrowing of the distal common bile duct is seen at the ampulla adjacent to the site of the periampullary duodenal diverticulum described below. Pancreas: No evidence of pancreatic mass or ductal dilatation. No evidence of pancreas divisum. Moderate edema is seen adjacent to the pancreatic head and surrounding a periampullary duodenum diverticulum described below. This may be due to pancreatitis or duodenum diverticulitis. Spleen:  Within normal limits  in size and appearance. Adrenals/Urinary Tract: No suspicious masses identified. No evidence of hydronephrosis. Stomach/Bowel: A diverticulum is seen arising from the periampullary descending duodenum which measures approximately 2.6 cm in diameter. Moderate surrounding edema is seen, which also surrounds the pancreatic head. Other visualized bowel loops are unremarkable. Vascular/Lymphatic: No pathologically enlarged lymph nodes identified. No acute vascular findings. Other:  None. Musculoskeletal:  No suspicious bone lesions identified. IMPRESSION: 2.6 cm periampullary duodenum diverticulum with surrounding inflammation which also surrounds the pancreatic head. Differential diagnosis includes duodenum diverticulitis and pancreatitis. Recommend correlation with pancreatic enzymes. Mild diffuse biliary ductal dilatation to the level of the ampulla, where there is narrowing at the site of the inflammation surrounding the periampullary duodenum diverticulum and pancreatic head. No evidence of choledocholithiasis.  Electronically Signed   By: Danae Orleans M.D.   On: 08/27/2023 17:32   MR 3D Recon At Scanner  Result Date: 08/27/2023 CLINICAL DATA:  Upper abdominal pain. Jaundice. Leukocytosis. Biliary ductal dilatation. Prior cholecystectomy. EXAM: MRI ABDOMEN WITHOUT AND WITH CONTRAST (INCLUDING MRCP) TECHNIQUE: Multiplanar multisequence MR imaging of the abdomen was performed both before and after the administration of intravenous contrast. Heavily T2-weighted images of the biliary and pancreatic ducts were obtained, and three-dimensional MRCP images were rendered by post processing. CONTRAST:  6mL GADAVIST GADOBUTROL 1 MMOL/ML IV SOLN COMPARISON:  CT on 08/26/2023 FINDINGS: Lower chest: No acute findings. Hepatobiliary: No hepatic masses identified. Prior cholecystectomy noted. Mild diffuse biliary ductal dilatation is seen to the level of the ampulla. No evidence of choledocholithiasis. Narrowing of the distal common bile duct is seen at the ampulla adjacent to the site of the periampullary duodenal diverticulum described below. Pancreas: No evidence of pancreatic mass or ductal dilatation. No evidence of pancreas divisum. Moderate edema is seen adjacent to the pancreatic head and surrounding a periampullary duodenum diverticulum described below. This may be due to pancreatitis or duodenum diverticulitis. Spleen:  Within normal limits in size and appearance. Adrenals/Urinary Tract: No suspicious masses identified. No evidence of hydronephrosis. Stomach/Bowel: A diverticulum is seen arising from the periampullary descending duodenum which measures approximately 2.6 cm in diameter. Moderate surrounding edema is seen, which also surrounds the pancreatic head. Other visualized bowel loops are unremarkable. Vascular/Lymphatic: No pathologically enlarged lymph nodes identified. No acute vascular findings. Other:  None. Musculoskeletal:  No suspicious bone lesions identified. IMPRESSION: 2.6 cm periampullary duodenum  diverticulum with surrounding inflammation which also surrounds the pancreatic head. Differential diagnosis includes duodenum diverticulitis and pancreatitis. Recommend correlation with pancreatic enzymes. Mild diffuse biliary ductal dilatation to the level of the ampulla, where there is narrowing at the site of the inflammation surrounding the periampullary duodenum diverticulum and pancreatic head. No evidence of choledocholithiasis. Electronically Signed   By: Danae Orleans M.D.   On: 08/27/2023 17:32        Scheduled Meds:  acetaminophen  1,000 mg Oral QID   enoxaparin (LOVENOX) injection  40 mg Subcutaneous Daily   pantoprazole  40 mg Oral BID   sertraline  50 mg Oral Daily   sodium chloride flush  3 mL Intravenous Q12H   traMADol  50 mg Oral Once   Continuous Infusions:  sodium chloride 125 mL/hr at 08/29/23 0410   cefTRIAXone (ROCEPHIN)  IV 2 g (08/28/23 1310)   metronidazole 500 mg (08/29/23 0834)     LOS: 3 days    Time spent: 45 minutes spent on chart review, discussion with nursing staff, consultants, updating family and interview/physical exam; more than 50% of that time was spent  in counseling and/or coordination of care.    Joseph Art, DO [facility] Available via Epic secure chat 7am-7pm After these hours, please refer to coverage provider listed on [email or web address] 08/29/2023, 9:12 AM

## 2023-08-29 NOTE — Progress Notes (Signed)
Subjective: Pain persists. Fever to 100.7 persists. Tolerating liquid diet.  Objective: Vital signs in last 24 hours: Temp:  [99 F (37.2 C)-100.7 F (38.2 C)] 99.4 F (37.4 C) (11/17 0730) Pulse Rate:  [88-97] 97 (11/17 0730) Resp:  [16-18] 16 (11/17 0730) BP: (120-129)/(53-68) 120/68 (11/17 0730) SpO2:  [83 %-98 %] 90 % (11/17 0730) Weight change:  Last BM Date : 08/28/23  PE: GEN:  NAD ABD:  Soft, mild epigastric tenderness  Lab Results: CBC    Component Value Date/Time   WBC 10.1 08/29/2023 0526   RBC 3.23 (L) 08/29/2023 0526   HGB 10.3 (L) 08/29/2023 0526   HGB 13.3 07/19/2023 1336   HGB 12.9 07/01/2017 1519   HGB 13.1 02/26/2017 1447   HCT 30.8 (L) 08/29/2023 0526   HCT 38.0 07/01/2017 1519   HCT 38.8 02/26/2017 1447   PLT 219 08/29/2023 0526   PLT 271 07/19/2023 1336   PLT 320 07/01/2017 1519   MCV 95.4 08/29/2023 0526   MCV 93 07/01/2017 1519   MCV 93.6 02/26/2017 1447   MCH 31.9 08/29/2023 0526   MCHC 33.4 08/29/2023 0526   RDW 12.9 08/29/2023 0526   RDW 13.2 07/01/2017 1519   RDW 12.8 02/26/2017 1447   LYMPHSABS 1.7 08/26/2023 0010   LYMPHSABS 1.6 07/01/2017 1519   LYMPHSABS 1.9 02/26/2017 1447   MONOABS 0.8 08/26/2023 0010   MONOABS 0.6 02/26/2017 1447   EOSABS 0.4 08/26/2023 0010   EOSABS 0.2 07/01/2017 1519   BASOSABS 0.1 08/26/2023 0010   BASOSABS 0.0 07/01/2017 1519   BASOSABS 0.0 02/26/2017 1447  CMP     Component Value Date/Time   NA 134 (L) 08/29/2023 0526   NA 144 07/01/2017 1519   NA 139 02/26/2017 1447   K 3.7 08/29/2023 0526   K 4.0 02/26/2017 1447   CL 103 08/29/2023 0526   CO2 24 08/29/2023 0526   CO2 27 02/26/2017 1447   GLUCOSE 156 (H) 08/29/2023 0526   GLUCOSE 110 02/26/2017 1447   BUN <5 (L) 08/29/2023 0526   BUN 14 07/01/2017 1519   BUN 19.0 02/26/2017 1447   CREATININE 0.68 08/29/2023 0526   CREATININE 0.74 07/19/2023 1336   CREATININE 0.8 02/26/2017 1447   CALCIUM 8.1 (L) 08/29/2023 0526   CALCIUM 10.1  02/26/2017 1447   PROT 5.5 (L) 08/29/2023 0526   PROT 7.4 07/01/2017 1519   PROT 7.4 02/26/2017 1447   ALBUMIN 2.7 (L) 08/29/2023 0526   ALBUMIN 4.9 07/01/2017 1519   ALBUMIN 4.5 02/26/2017 1447   AST 59 (H) 08/29/2023 0526   AST 15 07/19/2023 1336   AST 21 02/26/2017 1447   ALT 169 (H) 08/29/2023 0526   ALT 14 07/19/2023 1336   ALT 21 02/26/2017 1447   ALKPHOS 123 08/29/2023 0526   ALKPHOS 73 02/26/2017 1447   BILITOT 0.8 08/29/2023 0526   BILITOT 0.4 07/19/2023 1336   BILITOT 0.32 02/26/2017 1447   EGFR 82 (L) 02/26/2017 1447   GFRNONAA >60 08/29/2023 0526   GFRNONAA >60 07/19/2023 1336    Assessment:  Possible periampullary diverticulitis. Prominent CBD likely pressure effect from food/debris in diverticulum (in part removed yesterday during endoscopy). Low grade fevers.  Plan:   Antibiotics, analgesics, fluids. Liquid diet. If ongoing fevers and/or lack of improvement of abdominal pain, would consider repeat CT abd/pelvis in the next couple days. [facility] will follow.   Freddy Jaksch 08/29/2023, 12:06 PM   Cell [phone] If no answer or after 5 PM call [phone]

## 2023-08-30 ENCOUNTER — Inpatient Hospital Stay (HOSPITAL_COMMUNITY): Payer: BC Managed Care – PPO

## 2023-08-30 DIAGNOSIS — K831 Obstruction of bile duct: Secondary | ICD-10-CM

## 2023-08-30 DIAGNOSIS — K851 Biliary acute pancreatitis without necrosis or infection: Secondary | ICD-10-CM | POA: Diagnosis not present

## 2023-08-30 LAB — CBC
HCT: 30 % — ABNORMAL LOW (ref 36.0–46.0)
Hemoglobin: 9.9 g/dL — ABNORMAL LOW (ref 12.0–15.0)
MCH: 31.4 pg (ref 26.0–34.0)
MCHC: 33 g/dL (ref 30.0–36.0)
MCV: 95.2 fL (ref 80.0–100.0)
Platelets: 218 10*3/uL (ref 150–400)
RBC: 3.15 MIL/uL — ABNORMAL LOW (ref 3.87–5.11)
RDW: 13 % (ref 11.5–15.5)
WBC: 10.4 10*3/uL (ref 4.0–10.5)
nRBC: 0 % (ref 0.0–0.2)

## 2023-08-30 LAB — BASIC METABOLIC PANEL
Anion gap: 8 (ref 5–15)
BUN: <5 mg/dL — ABNORMAL LOW (ref 8–23)
CO2: 22 mmol/L (ref 22–32)
Calcium: 7.8 mg/dL — ABNORMAL LOW (ref 8.9–10.3)
Chloride: 104 mmol/L (ref 98–111)
Creatinine, Ser: 0.57 mg/dL (ref 0.44–1.00)
GFR, Estimated: >60 mL/min (ref >60)
Glucose, Bld: 145 mg/dL — ABNORMAL HIGH (ref 70–99)
Potassium: 3.3 mmol/L — ABNORMAL LOW (ref 3.5–5.1)
Sodium: 134 mmol/L — ABNORMAL LOW (ref 135–145)

## 2023-08-30 LAB — SURGICAL PATHOLOGY

## 2023-08-30 MED ORDER — POTASSIUM CHLORIDE CRYS ER 20 MEQ PO TBCR
40.0000 meq | EXTENDED_RELEASE_TABLET | Freq: Once | ORAL | Status: AC
  Administered 2023-08-30: 40 meq via ORAL
  Filled 2023-08-30: qty 2

## 2023-08-30 MED ORDER — FUROSEMIDE 10 MG/ML IJ SOLN
20.0000 mg | Freq: Once | INTRAMUSCULAR | Status: AC
  Administered 2023-08-30: 20 mg via INTRAVENOUS
  Filled 2023-08-30: qty 4

## 2023-08-30 NOTE — Progress Notes (Signed)
PROGRESS NOTE    Tiffany Nash  JWJ:[id number] DOB: [birth year] DOA: 08/25/2023 PCP: Darrow Bussing, MD    Brief Narrative:  Tiffany Nash is a 62 y.o. female with medical history significant of migraine headaches, breast cancer, arthritis, Schatzki's ring, hiatal hernia, and GERD who presents with complaints of abdominal pain which started yesterday evening.  She had had a late lunch eating [place] yesterday afternoon with 1 beer then for dinner she had 2 baby Bell cheeses with water and took a Pepcid as she normally does prior to going to bed.  Within 15 to 20 minutes patient reported having acute onset of epigastric abdominal pain. S/p EGD. S/p MRCP.  Continues to have low grade fever and now with crackles at bases   Assessment and Plan: Pancreatitis/Duodenal diverticulitis/Transaminitis -s/p EGD: large diverticulum with ulcer -MRCP done--- pancreatitis?  Diverticulitis? -GI consult appreciated -IV abx -Start Protonix 40 mg twice a day   Dyspnea with hypoxia -x ray read pending but appears to be some fluid  -trial of IV lasix x 1 -continue abx -incentive spirometry and ambulation  Hypokalemia -replete  Leukocytosis -resolved   Anxiety -Continue Zoloft   Hiatal hernia GERD with esophagitis History of Schatzki's ring Patient has a prior history of Schatzki's ring back in 2006 .  Last barium swallow from 2021 noted small hiatal hernia, mild reflux esophagitis, and mild to moderate esophageal dysmotility thought most likely related to chronic esophagitis.  Followed by [facility] in the past. -s/p EGD: EGD showed large diverticulum in the second portion of the duodenum which was treated with food debris.  Most of the content was removed using a Roth net and rat-tooth.  She had clean-based ulcer in the diverticulum. -I was not able to visualize ampulla properly. -EGD also showed esophagitis and gastritis. -Start Protonix 40 mg twice a day   Constipation -bowel  regimen  DVT prophylaxis: enoxaparin (LOVENOX) injection 40 mg Start: 08/26/23 1000    Code Status: Full Code Family Communication: husband at bedside 11/17  Disposition Plan:  Level of care: Med-Surg Status is: Inpatient Remains inpatient appropriate     Consultants:  GI   Subjective: Vomited x 1 this AM per RN C/o SOB  Objective: Vitals:   08/29/23 1548 08/29/23 2051 08/30/23 0623 08/30/23 0753  BP: 132/69 136/78 (!) 141/72 (!) 146/70  Pulse: 80 80 88 87  Resp: 16 18 18 16   Temp: 98.6 F (37 C) 98.7 F (37.1 C) 99.2 F (37.3 C) 98.8 F (37.1 C)  TempSrc: Oral Oral Oral Oral  SpO2: 94% 90% (!) 86% (!) 86%  Weight:      Height:        Intake/Output Summary (Last 24 hours) at 08/30/2023 1136 Last data filed at 08/30/2023 0856 Gross per 24 hour  Intake 960 ml  Output 400 ml  Net 560 ml    Filed Weights   08/25/23 2326 08/27/23 0745  Weight: 69.9 kg 69.9 kg    Examination:   General: Appearance:    Well developed, well nourished female who appears uncomfortable     Lungs:    Crackles at right lung base  Heart:    Normal heart rate.    MS:   All extremities are intact.   Neurologic:   Awake, alert       Data Reviewed: I have personally reviewed following labs and imaging studies  CBC: Recent Labs  Lab 08/26/23 0010 08/27/23 0616 08/28/23 0356 08/29/23 0526 08/30/23 0633  WBC 12.7* 4.9  9.8 10.1 10.4  NEUTROABS 9.8*  --   --   --   --   HGB 12.0 11.7* 11.5* 10.3* 9.9*  HCT 35.9* 35.5* 33.7* 30.8* 30.0*  MCV 94.7 95.7 94.4 95.4 95.2  PLT 285 254 257 219 218   Basic Metabolic Panel: Recent Labs  Lab 08/26/23 0138 08/27/23 0616 08/28/23 0356 08/29/23 0526 08/30/23 0633  NA 135 137 134* 134* 134*  K 3.7 3.9 3.5 3.7 3.3*  CL 102 107 103 103 104  CO2 24 22 23 24 22   GLUCOSE 154* 120* 142* 156* 145*  BUN 13 5* 7* <5* <5*  CREATININE 0.83 0.89 0.77 0.68 0.57  CALCIUM 9.4 9.0 8.5* 8.1* 7.8*   GFR: Estimated Creatinine Clearance: 67.4  mL/min (by C-G formula based on SCr of 0.57 mg/dL). Liver Function Tests: Recent Labs  Lab 08/26/23 0138 08/26/23 1259 08/27/23 0616 08/28/23 0356 08/29/23 0526  AST 516* 358* 131* 109* 59*  ALT 288* 418* 266* 206* 169*  ALKPHOS 127* 124 111 95 123  BILITOT 0.9 0.4 0.8 0.9 0.8  PROT 6.7 6.4* 6.1* 5.7* 5.5*  ALBUMIN 3.9 3.5 3.4* 3.1* 2.7*   Recent Labs  Lab 08/26/23 0138  LIPASE 1,725*   No results for input(s): "AMMONIA" in the last 168 hours. Coagulation Profile: No results for input(s): "INR", "PROTIME" in the last 168 hours. Cardiac Enzymes: No results for input(s): "CKTOTAL", "CKMB", "CKMBINDEX", "TROPONINI" in the last 168 hours. BNP (last 3 results) No results for input(s): "PROBNP" in the last 8760 hours. HbA1C: No results for input(s): "HGBA1C" in the last 72 hours. CBG: No results for input(s): "GLUCAP" in the last 168 hours. Lipid Profile: No results for input(s): "CHOL", "HDL", "LDLCALC", "TRIG", "CHOLHDL", "LDLDIRECT" in the last 72 hours.  Thyroid Function Tests: No results for input(s): "TSH", "T4TOTAL", "FREET4", "T3FREE", "THYROIDAB" in the last 72 hours. Anemia Panel: No results for input(s): "VITAMINB12", "FOLATE", "FERRITIN", "TIBC", "IRON", "RETICCTPCT" in the last 72 hours. Sepsis Labs: Recent Labs  Lab 08/26/23 1259  LATICACIDVEN 2.1*    Recent Results (from the past 240 hour(s))  Culture, blood (single) w Reflex to ID Panel     Status: None (Preliminary result)   Collection Time: 08/26/23 12:59 PM   Specimen: BLOOD RIGHT ARM  Result Value Ref Range Status   Specimen Description BLOOD RIGHT ARM  Final   Special Requests   Final    BOTTLES DRAWN AEROBIC AND ANAEROBIC Blood Culture adequate volume   Culture   Final    NO GROWTH 4 DAYS Performed at [facility], [address] [place], [place] [address]    Report Status PENDING  Incomplete         Radiology Studies: DG Abd Portable 1V  Result Date: 08/29/2023 CLINICAL  DATA:  Ileus EXAM: PORTABLE ABDOMEN - 1 VIEW COMPARISON:  None Available. FINDINGS: The bowel gas pattern is normal. No radio-opaque calculi or other significant radiographic abnormality are seen. IMPRESSION: Negative. Electronically Signed   By: Layla Maw M.D.   On: 08/29/2023 10:38        Scheduled Meds:  acetaminophen  1,000 mg Oral QID   enoxaparin (LOVENOX) injection  40 mg Subcutaneous Daily   pantoprazole  40 mg Oral BID   polyethylene glycol  17 g Oral BID   sertraline  50 mg Oral Daily   sodium chloride flush  3 mL Intravenous Q12H   traMADol  50 mg Oral Once   Continuous Infusions:  cefTRIAXone (ROCEPHIN)  IV  2 g (08/29/23 1215)   metronidazole 500 mg (08/30/23 0844)     LOS: 4 days    Time spent: 45 minutes spent on chart review, discussion with nursing staff, consultants, updating family and interview/physical exam; more than 50% of that time was spent in counseling and/or coordination of care.    Joseph Art, DO [facility] Available via Epic secure chat 7am-7pm After these hours, please refer to coverage provider listed on [email or web address] 08/30/2023, 11:36 AM

## 2023-08-30 NOTE — Anesthesia Postprocedure Evaluation (Signed)
Anesthesia Post Note  Patient: Tiffany Nash  Procedure(s) Performed: ESOPHAGOGASTRODUODENOSCOPY (EGD) WITH PROPOFOL BIOPSY     Patient location during evaluation: Endoscopy Anesthesia Type: MAC Level of consciousness: awake and alert Pain management: pain level controlled Vital Signs Assessment: post-procedure vital signs reviewed and stable Respiratory status: spontaneous breathing, nonlabored ventilation and respiratory function stable Cardiovascular status: stable and blood pressure returned to baseline Postop Assessment: no apparent nausea or vomiting Anesthetic complications: no   No notable events documented.            Landon Bassford

## 2023-08-30 NOTE — Anesthesia Preprocedure Evaluation (Signed)
Anesthesia Evaluation  Patient identified by MRN, date of birth, ID band Patient awake    Reviewed: Allergy & Precautions, NPO status , Patient's Chart, lab work & pertinent test results  History of Anesthesia Complications Negative for: history of anesthetic complications  Airway Mallampati: III  TM Distance: >3 FB Neck ROM: Full    Dental  (+) Dental Advisory Given   Pulmonary neg pulmonary ROS   breath sounds clear to auscultation       Cardiovascular negative cardio ROS  Rhythm:Regular     Neuro/Psych  Headaches  Anxiety      Neuromuscular disease    GI/Hepatic Neg liver ROS, hiatal hernia,GERD  ,,  Endo/Other  negative endocrine ROS    Renal/GU negative Renal ROS     Musculoskeletal negative musculoskeletal ROS (+)    Abdominal   Peds  Hematology   Anesthesia Other Findings   Reproductive/Obstetrics                             Anesthesia Physical Anesthesia Plan  ASA: 2  Anesthesia Plan: MAC   Post-op Pain Management: Minimal or no pain anticipated   Induction: Intravenous  PONV Risk Score and Plan: 2 and Propofol infusion and Treatment may vary due to age or medical condition  Airway Management Planned: Nasal Cannula, Simple Face Mask and Natural Airway  Additional Equipment: None  Intra-op Plan:   Post-operative Plan:   Informed Consent: I have reviewed the patients History and Physical, chart, labs and discussed the procedure including the risks, benefits and alternatives for the proposed anesthesia with the patient or authorized representative who has indicated his/her understanding and acceptance.     Dental advisory given  Plan Discussed with: CRNA  Anesthesia Plan Comments:        Anesthesia Quick Evaluation

## 2023-08-30 NOTE — Plan of Care (Signed)
  Problem: Education: Goal: Knowledge of General Education information will improve Description: Including pain rating scale, medication(s)/side effects and non-pharmacologic comfort measures Outcome: Progressing   Problem: Health Behavior/Discharge Planning: Goal: Ability to manage health-related needs will improve Outcome: Progressing   Problem: Skin Integrity: Goal: Risk for impaired skin integrity will decrease Outcome: Progressing   

## 2023-08-30 NOTE — Progress Notes (Signed)
Subjective: Patient reports that abdominal pain is more in the right upper quadrant post EGD, was epigastric when she first presented, has improved to 5 out of 10 from 10 out of 10 in intensity. She had bilious vomiting today morning. She states she continues to have low-grade fever, Tmax 100.7 F. She has not had a bowel movement in 3 days.  Objective: Vital signs in last 24 hours: Temp:  [98.6 F (37 C)-99.2 F (37.3 C)] 98.8 F (37.1 C) (11/18 0753) Pulse Rate:  [80-88] 87 (11/18 0753) Resp:  [16-18] 16 (11/18 0753) BP: (132-146)/(69-78) 146/70 (11/18 0753) SpO2:  [86 %-94 %] 86 % (11/18 0753) Weight change:  Last BM Date : 08/27/23  PE: Lying comfortably on bed GENERAL: Nonicteric, mild pallor  ABDOMEN: Right upper quadrant and epigastric tenderness, no rebound tenderness, rigidity or guarding, bowel sounds audible EXTREMITIES: No edema  Lab Results: Results for orders placed or performed during the hospital encounter of 08/25/23 (from the past 48 hour(s))  Comprehensive metabolic panel     Status: Abnormal   Collection Time: 08/29/23  5:26 AM  Result Value Ref Range   Sodium 134 (L) 135 - 145 mmol/L   Potassium 3.7 3.5 - 5.1 mmol/L   Chloride 103 98 - 111 mmol/L   CO2 24 22 - 32 mmol/L   Glucose, Bld 156 (H) 70 - 99 mg/dL    Comment: Glucose reference range applies only to samples taken after fasting for at least 8 hours.   BUN <5 (L) 8 - 23 mg/dL   Creatinine, Ser 4.09 0.44 - 1.00 mg/dL    Comment: DELTA CHECK NOTED   Calcium 8.1 (L) 8.9 - 10.3 mg/dL   Total Protein 5.5 (L) 6.5 - 8.1 g/dL   Albumin 2.7 (L) 3.5 - 5.0 g/dL   AST 59 (H) 15 - 41 U/L   ALT 169 (H) 0 - 44 U/L   Alkaline Phosphatase 123 38 - 126 U/L   Total Bilirubin 0.8 <1.2 mg/dL   GFR, Estimated >81 >19 mL/min    Comment: (NOTE) Calculated using the CKD-EPI Creatinine Equation (2021)    Anion gap 7 5 - 15    Comment: Performed at [facility], [address], [place], [place] [address]  CBC      Status: Abnormal   Collection Time: 08/29/23  5:26 AM  Result Value Ref Range   WBC 10.1 4.0 - 10.5 K/uL   RBC 3.23 (L) 3.87 - 5.11 MIL/uL   Hemoglobin 10.3 (L) 12.0 - 15.0 g/dL   HCT 95.6 (L) 21.3 - 08.6 %   MCV 95.4 80.0 - 100.0 fL   MCH 31.9 26.0 - 34.0 pg   MCHC 33.4 30.0 - 36.0 g/dL   RDW 57.8 46.9 - 62.9 %   Platelets 219 150 - 400 K/uL   nRBC 0.0 0.0 - 0.2 %    Comment: Performed at [facility], [address], [place], [place] [address]  CBC     Status: Abnormal   Collection Time: 08/30/23  6:33 AM  Result Value Ref Range   WBC 10.4 4.0 - 10.5 K/uL   RBC 3.15 (L) 3.87 - 5.11 MIL/uL   Hemoglobin 9.9 (L) 12.0 - 15.0 g/dL   HCT 32.4 (L) 40.1 - 02.7 %   MCV 95.2 80.0 - 100.0 fL   MCH 31.4 26.0 - 34.0 pg   MCHC 33.0 30.0 - 36.0 g/dL   RDW 25.3 66.4 - 40.3 %   Platelets  218 150 - 400 K/uL   nRBC 0.0 0.0 - 0.2 %    Comment: Performed at [facility], [address], [place], [place] [address]  Basic metabolic panel     Status: Abnormal   Collection Time: 08/30/23  6:33 AM  Result Value Ref Range   Sodium 134 (L) 135 - 145 mmol/L   Potassium 3.3 (L) 3.5 - 5.1 mmol/L   Chloride 104 98 - 111 mmol/L   CO2 22 22 - 32 mmol/L   Glucose, Bld 145 (H) 70 - 99 mg/dL    Comment: Glucose reference range applies only to samples taken after fasting for at least 8 hours.   BUN <5 (L) 8 - 23 mg/dL   Creatinine, Ser 2.95 0.44 - 1.00 mg/dL   Calcium 7.8 (L) 8.9 - 10.3 mg/dL   GFR, Estimated >62 >13 mL/min    Comment: (NOTE) Calculated using the CKD-EPI Creatinine Equation (2021)    Anion gap 8 5 - 15    Comment: Performed at [facility], [address], [place], [place] [address]    Studies/Results: DG Abd Portable 1V  Result Date: 08/29/2023 CLINICAL DATA:  Ileus EXAM: PORTABLE ABDOMEN - 1 VIEW COMPARISON:  None Available. FINDINGS: The bowel gas pattern is normal. No radio-opaque calculi or other significant radiographic abnormality are seen. IMPRESSION:  Negative. Electronically Signed   By: Layla Maw M.D.   On: 08/29/2023 10:38    Medications: I have reviewed the patient's current medications.  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 pylori  LFTs have improved, T. bili 0.8/AST 59/ALT 169/ALP 123 Sodium 134, potassium 3.3 Hemoglobin 9.9   Plan: Continue same management, will not advance beyond full liquid diet as patient had 1 episode of bilious vomiting today morning.  She is on ondansetron and prochlorperazine as needed every 6 hours.  Continue IV Flagyl and IV ceftriaxone for now as patient's symptoms may be related to duodenal diverticulitis.  Patient was on IV fluids which has been discontinued and 1 dose of Lasix 20 mg IV ordered due to concerns for fluid overload.   Kerin Salen, MD 08/30/2023, 9:37 AM

## 2023-08-31 ENCOUNTER — Other Ambulatory Visit (HOSPITAL_COMMUNITY): Payer: Self-pay

## 2023-08-31 ENCOUNTER — Encounter (HOSPITAL_COMMUNITY): Payer: Self-pay | Admitting: Gastroenterology

## 2023-08-31 ENCOUNTER — Encounter: Payer: Self-pay | Admitting: Hematology

## 2023-08-31 DIAGNOSIS — K831 Obstruction of bile duct: Secondary | ICD-10-CM | POA: Diagnosis not present

## 2023-08-31 DIAGNOSIS — K851 Biliary acute pancreatitis without necrosis or infection: Secondary | ICD-10-CM | POA: Diagnosis not present

## 2023-08-31 LAB — CBC
HCT: 29.8 % — ABNORMAL LOW (ref 36.0–46.0)
Hemoglobin: 9.9 g/dL — ABNORMAL LOW (ref 12.0–15.0)
MCH: 31.3 pg (ref 26.0–34.0)
MCHC: 33.2 g/dL (ref 30.0–36.0)
MCV: 94.3 fL (ref 80.0–100.0)
Platelets: 264 10*3/uL (ref 150–400)
RBC: 3.16 MIL/uL — ABNORMAL LOW (ref 3.87–5.11)
RDW: 13 % (ref 11.5–15.5)
WBC: 8.9 10*3/uL (ref 4.0–10.5)
nRBC: 0 % (ref 0.0–0.2)

## 2023-08-31 LAB — COMPREHENSIVE METABOLIC PANEL
ALT: 76 U/L — ABNORMAL HIGH (ref 0–44)
AST: 17 U/L (ref 15–41)
Albumin: 2.5 g/dL — ABNORMAL LOW (ref 3.5–5.0)
Alkaline Phosphatase: 133 U/L — ABNORMAL HIGH (ref 38–126)
Anion gap: 7 (ref 5–15)
BUN: <5 mg/dL — ABNORMAL LOW (ref 8–23)
CO2: 26 mmol/L (ref 22–32)
Calcium: 8.4 mg/dL — ABNORMAL LOW (ref 8.9–10.3)
Chloride: 103 mmol/L (ref 98–111)
Creatinine, Ser: 0.55 mg/dL (ref 0.44–1.00)
GFR, Estimated: >60 mL/min (ref >60)
Glucose, Bld: 128 mg/dL — ABNORMAL HIGH (ref 70–99)
Potassium: 3.5 mmol/L (ref 3.5–5.1)
Sodium: 136 mmol/L (ref 135–145)
Total Bilirubin: 0.6 mg/dL (ref <1.2)
Total Protein: 5.4 g/dL — ABNORMAL LOW (ref 6.5–8.1)

## 2023-08-31 LAB — CULTURE, BLOOD (SINGLE)
Culture: NO GROWTH
Special Requests: ADEQUATE

## 2023-08-31 MED ORDER — CIPROFLOXACIN HCL 500 MG PO TABS
500.0000 mg | ORAL_TABLET | Freq: Two times a day (BID) | ORAL | 0 refills | Status: AC
Start: 2023-08-31 — End: 2023-09-03
  Filled 2023-08-31: qty 6, 3d supply, fill #0

## 2023-08-31 MED ORDER — ONDANSETRON 4 MG PO TBDP
4.0000 mg | ORAL_TABLET | Freq: Three times a day (TID) | ORAL | 0 refills | Status: AC | PRN
  Filled 2023-08-31: qty 20, 15d supply, fill #0

## 2023-08-31 MED ORDER — PANTOPRAZOLE SODIUM 40 MG PO TBEC
40.0000 mg | DELAYED_RELEASE_TABLET | Freq: Two times a day (BID) | ORAL | 0 refills | Status: DC
  Filled 2023-08-31: qty 60, 30d supply, fill #0

## 2023-08-31 MED ORDER — TRAMADOL HCL 50 MG PO TABS
50.0000 mg | ORAL_TABLET | Freq: Four times a day (QID) | ORAL | 0 refills | Status: DC | PRN
  Filled 2023-08-31: qty 16, 4d supply, fill #0

## 2023-08-31 MED ORDER — METRONIDAZOLE 500 MG PO TABS
500.0000 mg | ORAL_TABLET | Freq: Two times a day (BID) | ORAL | 0 refills | Status: AC
  Filled 2023-08-31: qty 6, 3d supply, fill #0

## 2023-08-31 NOTE — Plan of Care (Signed)
Problem: Education: Goal: Knowledge of General Education information will improve Description: Including pain rating scale, medication(s)/side effects and non-pharmacologic comfort measures Outcome: Progressing Pt understands she was admitted into the hospital for abdominal pain.  She has an admitting diagnosis of pancreatitis.    Problem: Clinical Measurements: Goal: Will remain free from infection Outcome: Progressing S/Sx of infection monitored and assessed q-shift.  Pt has remained afebrile thus far.  She is on IV abx per MD's orders.       Problem: Clinical Measurements: Goal: Respiratory complications will improve Outcome: Progressing Respiratory status monitored and assessed q-shift.  Pt is on room air with PO2 at 91-97% and respiration rate of 18 breaths per minute.  Pt has not endorsed c/o SOB or DOE.     Problem: Clinical Measurements: Goal: Cardiovascular complication will be avoided Outcome: Progressing Pt's VS WNL thus far.   Problem: Activity: Goal: Risk for activity intolerance will decrease Outcome: Progressing Pt is independent of all her ADLs.  She can get OOB and ambulate in her room/ hallways independently.     Problem: Nutrition: Goal: Adequate nutrition will be maintained Outcome: Progressing Pt is on a regular diet per MD's orders and can tolerate it w/o s/sx of abdominal pain/ distention or n/v.     Problem: Safety: Goal: Ability to remain free from injury will improve Outcome: Progressing Pt has remained free from falls thus far.  Instructed pt to utilize RN call light for assistance.  Hourly rounds performed. Bed in lowest position, locked with two upper side rails engaged.  Belongings and call light within reach.    Problem: Skin Integrity: Goal: Risk for impaired skin integrity will decrease Outcome: Progressing Skin integrity monitored and assessed q-shift.  Instructed pt to q2 hours to prevent further skin impairment.  Tubes and drains assessed for  device related pressures sores.  Pt is continent of both bowel and bladder.       Problem: Pain Management: Goal: General experience of comfort will improve Outcome: Progressing Pt has endorsed 2/10 right abdominal pain describing it as a constant pressure.  Reiterated pain scale so she could adequately rate her pain.  Pt stated her pain goal this admission would be 0/10.  Discussed nonpharmacological methods to help reduce s/sx of pain.  Interventions given per pt's request and MD's orders.

## 2023-08-31 NOTE — Progress Notes (Signed)
Pt to be discharge home to self care.  Reviewed AVS with pt and made her aware of changes to her medications.  Informed pt that she had pending prescriptions to be picked up from the [facility]patient [facility]) and that she has f/u appointments with her PCP.  Answered any pending questions.  Pt has no further questions.  Assisted pt in gathering her belongings.  Removed PIV which was CDI and free from s/sx of infection upon removal.  Pt was escorted off the unit via wheelchair accompanied by SWOT RN to the front of the hospital where her ride awaits to take her home.  Pt was discharged in stable condition.

## 2023-08-31 NOTE — Discharge Summary (Signed)
Physician Discharge Summary  TANAIRY HOLDSWORTH BMW:[id number] DOB: 1961-06-05 DOA: 08/25/2023  PCP: Darrow Bussing, MD  Admit date: 08/25/2023 Discharge date: 08/31/2023  Admitted From: home Discharge disposition: home   Recommendations for Outpatient Follow-Up:   Close GI follow up- may need referral to [facility] at follow up Protonix x 1 month   Discharge Diagnosis:   Principal Problem:   Pancreatitis Active Problems:   Transaminitis   Duodenal diverticulum   Leukocytosis   Anxiety   Hiatal hernia   GERD with esophagitis    Discharge Condition: Improved.  Diet recommendation:  Regular.  Wound care: None.  Code status: Full.   History of Present Illness:   Tiffany Nash is a 62 y.o. female with medical history significant of migraine headaches, breast cancer, arthritis, Schatzki's ring, hiatal hernia, and GERD who presents with complaints of abdominal pain which started yesterday evening.  She had had a late lunch eating [place] yesterday afternoon with 1 beer then for dinner she had 2 baby Bell cheeses with water and took a Pepcid as she normally does prior to going to bed.  Within 15 to 20 minutes patient reported having acute onset of epigastric abdominal pain.  Noted associated symptoms of diaphoresis.  Pain was sharp and radiated into her chest.  Patient not having any significant fever, chills, cough, or shortness of breath symptoms.  Patient reports that she normally drinks a couple beers per week, but not on a daily basis.  Family history is significant for pancreatic cancer in her brother and grandfather.   Records note patient had barium swallow back in 2021 ordered by Dr. Matthias Hughs which noted small hiatal hernia, evidence of mild reflux esophagitis, no significant esophageal stricture, and mild-to-moderate esophageal dysmotility.   In the emergency department patient was noted to be afebrile with pulse 56-65, respirations 13-22, and all other vital signs  maintained.  Labs significant for WBC 12.7, glucose 154, alkaline phosphatase 127, lipase 1725, AST 516, ALT 288, total bilirubin 0.9, and high-sensitivity troponins negative x 2.  Chest x-ray noted no acute abnormality.  CT scan of the abdomen pelvis with contrast significant for  large duodenal diverticulum extending off the medial aspect of the second portion of the duodenal exerting local mass effect that may narrow the distal common bile duct,  and is associated with some mild intra/extrahepatic biliary ductal dilatation. Patient was given 1 L bolus of IV fluids, fentanyl 50 mg, and GI cocktail.   Hospital Course by Problem:   Pancreatitis/Duodenal diverticulitis/Transaminitis -s/p EGD: large diverticulum with ulcer -MRCP done--- pancreatitis?  Diverticulitis? -GI consult appreciated -IV abx- change to PO for total of 5 days -Start Protonix 40 mg twice a day   Dyspnea with hypoxia -resolved -incentive spirometry and ambulation   Hypokalemia -replete   Leukocytosis -resolved   Anxiety -Continue Zoloft   Hiatal hernia GERD with esophagitis History of Schatzki's ring Patient has a prior history of Schatzki's ring back in 2006 .  Last barium swallow from 2021 noted small hiatal hernia, mild reflux esophagitis, and mild to moderate esophageal dysmotility thought most likely related to chronic esophagitis.  Followed by [facility] in the past. -s/p EGD: EGD showed large diverticulum in the second portion of the duodenum which was treated with food debris.  Most of the content was removed using a Roth net and rat-tooth.  She had clean-based ulcer in the diverticulum. -I was not able to visualize ampulla properly. -EGD also showed esophagitis and gastritis. -Start  Protonix 40 mg twice a day    Constipation -bowel regimen      Medical Consultants:   GI   Discharge Exam:   Vitals:   08/31/23 0500 08/31/23 0739  BP:  (!) 140/76  Pulse:  80  Resp:    Temp:  98.2 F (36.8 C)   SpO2: 93% 96%   Vitals:   08/30/23 1900 08/31/23 0442 08/31/23 0500 08/31/23 0739  BP: (!) 110/58 (!) 118/58  (!) 140/76  Pulse: 74 85  80  Resp: 18 18    Temp: 98.4 F (36.9 C) 98.6 F (37 C)  98.2 F (36.8 C)  TempSrc: Oral Oral  Oral  SpO2: 96% 91% 93% 96%  Weight:      Height:        General exam: Appears calm and comfortable.    The results of significant diagnostics from this hospitalization (including imaging, microbiology, ancillary and laboratory) are listed below for reference.     Procedures and Diagnostic Studies:   CT ABDOMEN PELVIS W CONTRAST  Result Date: 08/26/2023 CLINICAL DATA:  62 year old female with history of upper abdominal pain radiating into the central chest. Suspected pancreatitis. EXAM: CT ABDOMEN AND PELVIS WITH CONTRAST TECHNIQUE: Multidetector CT imaging of the abdomen and pelvis was performed using the standard protocol following bolus administration of intravenous contrast. RADIATION DOSE REDUCTION: This exam was performed according to the departmental dose-optimization program which includes automated exposure control, adjustment of the mA and/or kV according to patient size and/or use of iterative reconstruction technique. CONTRAST:  75mL OMNIPAQUE IOHEXOL 300 MG/ML  SOLN COMPARISON:  CT of the abdomen and pelvis 03/12/2021. FINDINGS: Lower chest: Unremarkable. Hepatobiliary: No suspicious cystic or solid hepatic lesions. Minimal intrahepatic biliary ductal dilatation. Common bile duct is also dilated measuring 11 mm in the porta hepatis, which may simply reflect benign post cholecystectomy physiology. No calcified stone identified in the common bile duct to suggest choledocholithiasis. Pancreas: No pancreatic mass. No pancreatic ductal dilatation. No pancreatic or peripancreatic fluid collections or inflammatory changes. Spleen: Unremarkable. Adrenals/Urinary Tract: Bilateral kidneys and adrenal glands are normal in appearance. No  hydroureteronephrosis. Urinary bladder is normal in appearance. Stomach/Bowel: The appearance of the stomach is normal. No pathologic dilatation of small bowel or colon. However, there is a dilated structure in the right upper quadrant of the abdomen between the duodenal and the common bile duct (axial image 25 of series 3 and coronal image 49 of series 6) which contains a combination of fluid, gas and what appears to be feculent material, likely to represent a large duodenal diverticulum extending off the medial aspect of the second portion of the duodenal. This measures approximately 4.2 x 3.7 x 4.0 cm and exerts local mass effect upon adjacent structures, likely causing some narrowing of the distal common bile duct. Numerous colonic diverticuli are noted, without surrounding inflammatory changes to indicate an acute diverticulitis at this time. Normal appendix. Vascular/Lymphatic: Atherosclerosis in the abdominal aorta and pelvic vasculature. No lymphadenopathy noted in the abdomen or pelvis. Reproductive: Uterus and ovaries are unremarkable in appearance. Other: No significant volume of ascites.  No pneumoperitoneum. Musculoskeletal: There are no aggressive appearing lytic or blastic lesions noted in the visualized portions of the skeleton. IMPRESSION: 1. While there are no definite acute findings confidently identified in the abdomen or pelvis (specifically, no imaging findings of acute pancreatitis), there is what appears to be a very large duodenal diverticulum extending off the medial aspect of the second portion of the duodenal exerting local  mass effect. This may narrow the distal common bile duct and is associated with some mild intra and extrahepatic biliary ductal dilatation. This diverticulum is similar in retrospect compared to the prior examination from 2022, but currently contains what appears to be feculent material. No overt surrounding inflammatory changes to indicate a duodenal diverticulitis at  this time. 2. Extensive colonic diverticulosis without evidence of acute colonic diverticulitis at this time. 3. Aortic atherosclerosis. Electronically Signed   By: Trudie Reed M.D.   On: 08/26/2023 05:50   DG Chest Port 1 View  Result Date: 08/26/2023 CLINICAL DATA:  [id number] Epigastric abdominal pain [id number] EXAM: PORTABLE CHEST 1 VIEW COMPARISON:  None Available. FINDINGS: The heart and mediastinal contours are within normal limits. No focal consolidation. No pulmonary edema. No pleural effusion. No pneumothorax. No acute osseous abnormality.  Left axillary vascular clips. IMPRESSION: No active disease. Electronically Signed   By: Tish Frederickson M.D.   On: 08/26/2023 00:27     Labs:   Basic Metabolic Panel: Recent Labs  Lab 08/27/23 0616 08/28/23 0356 08/29/23 0526 08/30/23 0633 08/31/23 0705  NA 137 134* 134* 134* 136  K 3.9 3.5 3.7 3.3* 3.5  CL 107 103 103 104 103  CO2 22 23 24 22 26   GLUCOSE 120* 142* 156* 145* 128*  BUN 5* 7* <5* <5* <5*  CREATININE 0.89 0.77 0.68 0.57 0.55  CALCIUM 9.0 8.5* 8.1* 7.8* 8.4*   GFR Estimated Creatinine Clearance: 67.4 mL/min (by C-G formula based on SCr of 0.55 mg/dL). Liver Function Tests: Recent Labs  Lab 08/26/23 1259 08/27/23 0616 08/28/23 0356 08/29/23 0526 08/31/23 0705  AST 358* 131* 109* 59* 17  ALT 418* 266* 206* 169* 76*  ALKPHOS 124 111 95 123 133*  BILITOT 0.4 0.8 0.9 0.8 0.6  PROT 6.4* 6.1* 5.7* 5.5* 5.4*  ALBUMIN 3.5 3.4* 3.1* 2.7* 2.5*   Recent Labs  Lab 08/26/23 0138  LIPASE 1,725*   No results for input(s): "AMMONIA" in the last 168 hours. Coagulation profile No results for input(s): "INR", "PROTIME" in the last 168 hours.  CBC: Recent Labs  Lab 08/26/23 0010 08/27/23 0616 08/28/23 0356 08/29/23 0526 08/30/23 0633 08/31/23 0705  WBC 12.7* 4.9 9.8 10.1 10.4 8.9  NEUTROABS 9.8*  --   --   --   --   --   HGB 12.0 11.7* 11.5* 10.3* 9.9* 9.9*  HCT 35.9* 35.5* 33.7* 30.8* 30.0* 29.8*  MCV 94.7 95.7  94.4 95.4 95.2 94.3  PLT 285 254 257 219 218 264   Cardiac Enzymes: No results for input(s): "CKTOTAL", "CKMB", "CKMBINDEX", "TROPONINI" in the last 168 hours. BNP: Invalid input(s): "POCBNP" CBG: No results for input(s): "GLUCAP" in the last 168 hours. D-Dimer No results for input(s): "DDIMER" in the last 72 hours. Hgb A1c No results for input(s): "HGBA1C" in the last 72 hours. Lipid Profile No results for input(s): "CHOL", "HDL", "LDLCALC", "TRIG", "CHOLHDL", "LDLDIRECT" in the last 72 hours. Thyroid function studies No results for input(s): "TSH", "T4TOTAL", "T3FREE", "THYROIDAB" in the last 72 hours.  Invalid input(s): "FREET3" Anemia work up No results for input(s): "VITAMINB12", "FOLATE", "FERRITIN", "TIBC", "IRON", "RETICCTPCT" in the last 72 hours. Microbiology Recent Results (from the past 240 hour(s))  Culture, blood (single) w Reflex to ID Panel     Status: None   Collection Time: 08/26/23 12:59 PM   Specimen: BLOOD RIGHT ARM  Result Value Ref Range Status   Specimen Description BLOOD RIGHT ARM  Final   Special Requests  Final    BOTTLES DRAWN AEROBIC AND ANAEROBIC Blood Culture adequate volume   Culture   Final    NO GROWTH 5 DAYS Performed at [facility], [address] [place], [place] [address]    Report Status 08/31/2023 FINAL  Final     Discharge Instructions:   Discharge Instructions     Diet general   Complete by: As directed    Discharge instructions   Complete by: As directed    Use Protonix (pantoprazole) 40 mg PO BID for 4  weeks. Outpatient GI follow up   Increase activity slowly   Complete by: As directed       Allergies as of 08/31/2023       Reactions   Ibuprofen Other (See Comments)   Oxycodone-acetaminophen Other (See Comments)   Tachycardia   Tyloxapol Other (See Comments)   Zoledronic Acid Other (See Comments)   Penicillins Rash   Yeast infection        Medication List     STOP taking these medications     letrozole 2.5 MG tablet Commonly known as: FEMARA       TAKE these medications    acetaminophen 500 MG tablet Commonly known as: TYLENOL Take 500 mg by mouth as needed for mild pain (pain score 1-3).   acyclovir ointment 5 % Commonly known as: Zovirax Apply 1 Application topically every 3 (three) hours.   Calcium-Magnesium-Vitamin D 600-40-500 MG-MG-UNIT Tb24 Take 2 capsules by mouth once a week. Take on Wednesday   ciprofloxacin 500 MG tablet Commonly known as: Cipro Take 1 tablet (500 mg total) by mouth 2 (two) times daily for 3 days.   MAGNESIUM GLYCINATE PO Take 2 capsules by mouth at bedtime.   metroNIDAZOLE 500 MG tablet Commonly known as: Flagyl Take 1 tablet (500 mg total) by mouth 2 (two) times daily for 3 days.   ondansetron 4 MG disintegrating tablet Commonly known as: ZOFRAN-ODT Take 1 tablet (4 mg total) by mouth every 8 (eight) hours as needed for nausea or vomiting.   pantoprazole 40 MG tablet Commonly known as: PROTONIX Take 1 tablet (40 mg total) by mouth 2 (two) times daily.   QC TUMERIC COMPLEX PO Take 1 Dose by mouth daily.   RESVERATROL PO Take 2 capsules by mouth daily.   sertraline 50 MG tablet Commonly known as: ZOLOFT TAKE 1 TABLET BY MOUTH EVERY DAY   SUMAtriptan 100 MG tablet Commonly known as: IMITREX May repeat in 2 hours if headache persists or recurs. What changed:  how much to take how to take this when to take this reasons to take this   traMADol 50 MG tablet Commonly known as: ULTRAM Take 1 tablet (50 mg total) by mouth every 6 (six) hours as needed for moderate pain (pain score 4-6).   triamcinolone cream 0.1 % Commonly known as: KENALOG Apply 1 Application topically 2 (two) times daily. What changed:  when to take this reasons to take this   valACYclovir 1000 MG tablet Commonly known as: Valtrex Take 0.5 tablets (500 mg total) by mouth 2 (two) times daily. What changed:  when to take this reasons to take  this          Time coordinating discharge: 45 min  Signed:  Joseph Art DO  [facility] 08/31/2023, 10:55 AM

## 2023-08-31 NOTE — Progress Notes (Signed)
Subjective: Patient reports that the abdominal pain has improved and is now only 2 out of 10 in intensity. She was given MiraLAX and reports having at least 5 loose explosive bowel movements. She is slightly nauseous.  Objective: Vital signs in last 24 hours: Temp:  [98.2 F (36.8 C)-98.6 F (37 C)] 98.2 F (36.8 C) (11/19 0739) Pulse Rate:  [74-85] 80 (11/19 0739) Resp:  [16-18] 18 (11/19 0442) BP: (110-140)/(58-76) 140/76 (11/19 0739) SpO2:  [91 %-96 %] 96 % (11/19 0739) Weight change:  Last BM Date : 08/31/23  PE: Appears comfortable GENERAL: Nonicteric, mild pallor ABDOMEN: Soft, nondistended, nontender, normoactive bowel sounds EXTREMITIES: No deformity  Lab Results: Results for orders placed or performed during the hospital encounter of 08/25/23 (from the past 48 hour(s))  CBC     Status: Abnormal   Collection Time: 08/30/23  6:33 AM  Result Value Ref Range   WBC 10.4 4.0 - 10.5 K/uL   RBC 3.15 (L) 3.87 - 5.11 MIL/uL   Hemoglobin 9.9 (L) 12.0 - 15.0 g/dL   HCT 40.9 (L) 81.1 - 91.4 %   MCV 95.2 80.0 - 100.0 fL   MCH 31.4 26.0 - 34.0 pg   MCHC 33.0 30.0 - 36.0 g/dL   RDW 78.2 95.6 - 21.3 %   Platelets 218 150 - 400 K/uL   nRBC 0.0 0.0 - 0.2 %    Comment: Performed at [facility], [address] [place], [place] [address]  Basic metabolic panel     Status: Abnormal   Collection Time: 08/30/23  6:33 AM  Result Value Ref Range   Sodium 134 (L) 135 - 145 mmol/L   Potassium 3.3 (L) 3.5 - 5.1 mmol/L   Chloride 104 98 - 111 mmol/L   CO2 22 22 - 32 mmol/L   Glucose, Bld 145 (H) 70 - 99 mg/dL    Comment: Glucose reference range applies only to samples taken after fasting for at least 8 hours.   BUN <5 (L) 8 - 23 mg/dL   Creatinine, Ser 8.46 0.44 - 1.00 mg/dL   Calcium 7.8 (L) 8.9 - 10.3 mg/dL   GFR, Estimated >96 >29 mL/min    Comment: (NOTE) Calculated using the CKD-EPI Creatinine Equation (2021)    Anion gap 8 5 - 15    Comment: Performed at [facility], [address], [place], [place] [address]  Comprehensive metabolic panel     Status: Abnormal   Collection Time: 08/31/23  7:05 AM  Result Value Ref Range   Sodium 136 135 - 145 mmol/L   Potassium 3.5 3.5 - 5.1 mmol/L   Chloride 103 98 - 111 mmol/L   CO2 26 22 - 32 mmol/L   Glucose, Bld 128 (H) 70 - 99 mg/dL    Comment: Glucose reference range applies only to samples taken after fasting for at least 8 hours.   BUN <5 (L) 8 - 23 mg/dL   Creatinine, Ser 3.24 0.44 - 1.00 mg/dL   Calcium 8.4 (L) 8.9 - 10.3 mg/dL   Total Protein 5.4 (L) 6.5 - 8.1 g/dL   Albumin 2.5 (L) 3.5 - 5.0 g/dL   AST 17 15 - 41 U/L   ALT 76 (H) 0 - 44 U/L   Alkaline Phosphatase 133 (H) 38 - 126 U/L   Total Bilirubin 0.6 <1.2 mg/dL   GFR, Estimated >40 >10 mL/min    Comment: (NOTE) Calculated using the CKD-EPI Creatinine Equation (2021)    Anion  gap 7 5 - 15    Comment: Performed at [facility], [address], [place], [place] [address]  CBC     Status: Abnormal   Collection Time: 08/31/23  7:05 AM  Result Value Ref Range   WBC 8.9 4.0 - 10.5 K/uL   RBC 3.16 (L) 3.87 - 5.11 MIL/uL   Hemoglobin 9.9 (L) 12.0 - 15.0 g/dL   HCT 78.2 (L) 95.6 - 21.3 %   MCV 94.3 80.0 - 100.0 fL   MCH 31.3 26.0 - 34.0 pg   MCHC 33.2 30.0 - 36.0 g/dL   RDW 08.6 57.8 - 46.9 %   Platelets 264 150 - 400 K/uL   nRBC 0.0 0.0 - 0.2 %    Comment: Performed at [facility], [address], [place], [place] [address]    Studies/Results: DG CHEST PORT 1 VIEW  Result Date: 08/30/2023 CLINICAL DATA:  Shortness of breath EXAM: PORTABLE CHEST 1 VIEW COMPARISON:  08/26/2023 FINDINGS: The heart size and mediastinal contours are within normal limits. Bilateral airspace opacities, most most pronounced within the right lung base and left perihilar region. Small-moderate right and small left pleural effusions. No pneumothorax. The visualized skeletal structures are unremarkable. IMPRESSION: 1. Bilateral airspace opacities,  most pronounced within the right lung base and left perihilar region. Findings may reflect edema or multifocal pneumonia. 2. Small-moderate right and small left pleural effusions. Electronically Signed   By: Duanne Guess D.O.   On: 08/30/2023 12:25   DG Abd Portable 1V  Result Date: 08/29/2023 CLINICAL DATA:  Ileus EXAM: PORTABLE ABDOMEN - 1 VIEW COMPARISON:  None Available. FINDINGS: The bowel gas pattern is normal. No radio-opaque calculi or other significant radiographic abnormality are seen. IMPRESSION: Negative. Electronically Signed   By: Layla Maw M.D.   On: 08/29/2023 10:38    Medications: I have reviewed the patient's current medications.  Assessment: Pancreatitis: Abdominal pain and elevated lipase on admission?  related to duodenal diverticulum versus significant alcohol use LFTs have nearly normalized  Large duodenal diverticulum with inflammation?  Duodenal diverticulitis  Normocytic anemia  Plan: I have changed her diet to regular today. Currently on IV Flagyl and IV ceftriaxone, okay to DC home on oral Cipro and oral Flagyl for 5 more days. Advised patient to avoid alcohol. Okay to DC home from GI standpoint.  Kerin Salen, MD 08/31/2023, 9:36 AM

## 2023-09-23 ENCOUNTER — Ambulatory Visit (HOSPITAL_BASED_OUTPATIENT_CLINIC_OR_DEPARTMENT_OTHER): Payer: BC Managed Care – PPO

## 2023-09-23 VITALS — BP 123/82 | HR 88 | Ht 66.0 in | Wt 145.8 lb

## 2023-09-23 DIAGNOSIS — N898 Other specified noninflammatory disorders of vagina: Secondary | ICD-10-CM | POA: Diagnosis not present

## 2023-09-23 LAB — POCT URINALYSIS DIPSTICK
Bilirubin, UA: NEGATIVE
Blood, UA: NEGATIVE
Glucose, UA: NEGATIVE
Ketones, UA: NEGATIVE
Leukocytes, UA: NEGATIVE
Nitrite, UA: NEGATIVE
Protein, UA: NEGATIVE
Spec Grav, UA: 1.025 (ref 1.010–1.025)
Urobilinogen, UA: 0.2 U/dL
pH, UA: 6 (ref 5.0–8.0)

## 2023-09-23 NOTE — Progress Notes (Signed)
Pt presented to office for vaginal odor with concern of possible uti. Pt educated on urine sample. Urine sample obtained. Poc dipstick was obtained and came back negative. Urine culture was sent out. Pt was notified of urine culture being sent out.

## 2023-09-24 LAB — URINE CULTURE

## 2023-10-14 DIAGNOSIS — Z853 Personal history of malignant neoplasm of breast: Secondary | ICD-10-CM | POA: Diagnosis not present

## 2023-10-14 DIAGNOSIS — Z1231 Encounter for screening mammogram for malignant neoplasm of breast: Secondary | ICD-10-CM | POA: Diagnosis not present

## 2023-10-14 DIAGNOSIS — M8588 Other specified disorders of bone density and structure, other site: Secondary | ICD-10-CM | POA: Diagnosis not present

## 2023-10-16 ENCOUNTER — Other Ambulatory Visit: Payer: Self-pay | Admitting: Hematology

## 2023-10-18 ENCOUNTER — Encounter: Payer: Self-pay | Admitting: Hematology

## 2023-10-19 ENCOUNTER — Encounter: Payer: Self-pay | Admitting: Hematology

## 2023-10-29 DIAGNOSIS — R109 Unspecified abdominal pain: Secondary | ICD-10-CM | POA: Diagnosis not present

## 2023-10-29 DIAGNOSIS — K639 Disease of intestine, unspecified: Secondary | ICD-10-CM | POA: Diagnosis not present

## 2023-10-29 DIAGNOSIS — Z1211 Encounter for screening for malignant neoplasm of colon: Secondary | ICD-10-CM | POA: Diagnosis not present

## 2023-11-02 DIAGNOSIS — L821 Other seborrheic keratosis: Secondary | ICD-10-CM | POA: Diagnosis not present

## 2023-11-02 DIAGNOSIS — R21 Rash and other nonspecific skin eruption: Secondary | ICD-10-CM | POA: Diagnosis not present

## 2023-12-15 ENCOUNTER — Encounter: Payer: Self-pay | Admitting: Hematology

## 2023-12-16 ENCOUNTER — Other Ambulatory Visit: Payer: Self-pay | Admitting: Nurse Practitioner

## 2023-12-16 MED ORDER — SERTRALINE HCL 50 MG PO TABS: 50.0000 mg | ORAL_TABLET | Freq: Every day | ORAL | 0 refills | Status: DC

## 2024-05-04 ENCOUNTER — Ambulatory Visit (HOSPITAL_BASED_OUTPATIENT_CLINIC_OR_DEPARTMENT_OTHER): Admitting: Obstetrics & Gynecology

## 2024-05-04 VITALS — BP 110/74 | HR 81 | Wt 150.0 lb

## 2024-05-04 DIAGNOSIS — G43109 Migraine with aura, not intractable, without status migrainosus: Secondary | ICD-10-CM | POA: Diagnosis not present

## 2024-05-04 DIAGNOSIS — Z01419 Encounter for gynecological examination (general) (routine) without abnormal findings: Secondary | ICD-10-CM

## 2024-05-04 DIAGNOSIS — N766 Ulceration of vulva: Secondary | ICD-10-CM | POA: Diagnosis not present

## 2024-05-04 DIAGNOSIS — M8589 Other specified disorders of bone density and structure, multiple sites: Secondary | ICD-10-CM | POA: Diagnosis not present

## 2024-05-04 MED ORDER — VALACYCLOVIR HCL 500 MG PO TABS: 500.0000 mg | ORAL_TABLET | Freq: Two times a day (BID) | ORAL | 1 refills | Status: AC

## 2024-05-04 MED ORDER — ACYCLOVIR 5 % EX OINT
1.0000 | TOPICAL_OINTMENT | CUTANEOUS | 0 refills | Status: AC
Start: 2024-05-04 — End: ?

## 2024-05-04 NOTE — Progress Notes (Signed)
 ANNUAL EXAM Patient name: RAMSIE OSTRANDER MRN [id number]  Date of birth: 14-Jan-1961 Chief Complaint:   Gynecologic Exam, Fatigue, and Constipation (Has not had a bowel movement since Saturday )  History of Present Illness:   Tasheena' E Barua is a 63 y.o. [phone] Caucasian female being seen today for a routine annual exam.  Doing well.  Husband's throat cancer returned.  Had to have larynx removed.  Working on regaining speech.  Was quite a hard time but he is back at work and doing well.  Denies vaginal bleeding.  No recent UTI.     Patient's last menstrual period was 05/12/2012.   Last pap 04/06/2022. Results were: NILM w/ HRHPV negative. Last mammogram: 10/14/2023. Results were: normal.  Last colonoscopy: 05/20/2021. Results were: normal. Family h/o colorectal cancer: no     07/15/2023    2:40 PM 04/27/2023    1:15 PM 04/08/2023    2:39 PM 04/10/2022   10:23 AM 04/06/2022    3:42 PM  Depression screen PHQ 2/9  Decreased Interest 0 0 0 0 0  Down, Depressed, Hopeless 0 0 0 0 0  PHQ - 2 Score 0 0 0 0 0     Review of Systems:   Pertinent items are noted in HPI Denies any headaches, blurred vision, fatigue, shortness of breath, chest pain, abdominal pain, abnormal vaginal discharge/itching/odor/irritation, problems with periods, bowel movements, urination, or intercourse unless otherwise stated above. Pertinent History Reviewed:  Reviewed past medical,surgical, social and family history.  Reviewed problem list, medications and allergies. Physical Assessment:   Vitals:   05/04/24 1518  BP: 110/74  Pulse: 81  SpO2: 99%  Weight: 150 lb (68 kg)  Body mass index is 24.21 kg/m.        Physical Examination:   General appearance - well appearing, and in no distress  Mental status - alert, oriented to person, place, and time  Psych:  She has a normal mood and affect  Skin - warm and dry, normal color, no suspicious lesions noted  Chest - effort normal, all lung fields clear to  auscultation bilaterally  Heart - normal rate and regular rhythm  Neck:  midline trachea, no thyromegaly or nodules  Breasts - stable right breast scars with radiation changes, no suspicious masses, no skin or nipple changes or axillary nodes  Abdomen - soft, nontender, nondistended, no masses or organomegaly  Pelvic - VULVA: normal appearing vulva with no masses, tenderness or lesions   VAGINA: normal appearing vagina with normal color and discharge, no lesions   CERVIX: normal appearing cervix without discharge or lesions, no CMT  Thin prep pap is not done today  UTERUS: uterus is felt to be normal size, shape, consistency and nontender   ADNEXA: No adnexal masses or tenderness noted.  Rectal - normal rectal, good sphincter tone, no masses felt.   Extremities:  No swelling or varicosities noted  Chaperone present for exam  No results found for this or any previous visit (from the past 24 hours).  Assessment & Plan:  1. Well woman exam with routine gynecological exam (Primary) - Pap smear neg with neg HR HPV 04/06/2022.  Not indicated today. - Mammogram 10/14/2023 - Colonoscopy 05/20/2021 - Bone mineral density ordered - lab work done with PCP, Dr. Regino - vaccines reviewed/updated  H/O vulvar ulceration - valACYclovir  (VALTREX ) 500 MG tablet; Take 1 tablet (500 mg total) by mouth 2 (two) times daily. Take for 3 days with symptoms.  Dispense: 12 tablet; Refill:  1 - acyclovir  ointment (ZOVIRAX ) 5 %; Apply 1 Application topically every 3 (three) hours.  Dispense: 15 g; Refill: 0  3. Migraine with aura and without status migrainosus, not intractable - much improved  4. Osteopenia of multiple sites - dexa ordered to update this year   No orders of the defined types were placed in this encounter.   Meds:  Meds ordered this encounter  Medications   valACYclovir  (VALTREX ) 500 MG tablet    Sig: Take 1 tablet (500 mg total) by mouth 2 (two) times daily. Take for 3 days with symptoms.     Dispense:  12 tablet    Refill:  1    Please file rx for pt.  She will call if/when needs.   acyclovir  ointment (ZOVIRAX ) 5 %    Sig: Apply 1 Application topically every 3 (three) hours.    Dispense:  15 g    Refill:  0    Please file rx.  Pt will call when she needs RF.    Follow-up: No follow-ups on file.  Ronal GORMAN Pinal, MD 05/07/2024 11:27 PM

## 2024-05-07 ENCOUNTER — Encounter (HOSPITAL_BASED_OUTPATIENT_CLINIC_OR_DEPARTMENT_OTHER): Payer: Self-pay | Admitting: Obstetrics & Gynecology

## 2024-05-12 DIAGNOSIS — Z0000 Encounter for general adult medical examination without abnormal findings: Secondary | ICD-10-CM | POA: Diagnosis not present

## 2024-05-12 DIAGNOSIS — Z853 Personal history of malignant neoplasm of breast: Secondary | ICD-10-CM | POA: Diagnosis not present

## 2024-05-15 DIAGNOSIS — S99912A Unspecified injury of left ankle, initial encounter: Secondary | ICD-10-CM | POA: Diagnosis not present

## 2024-05-15 DIAGNOSIS — S99922A Unspecified injury of left foot, initial encounter: Secondary | ICD-10-CM | POA: Diagnosis not present

## 2024-05-23 DIAGNOSIS — F411 Generalized anxiety disorder: Secondary | ICD-10-CM | POA: Diagnosis not present

## 2024-06-07 DIAGNOSIS — S92132A Displaced fracture of posterior process of left talus, initial encounter for closed fracture: Secondary | ICD-10-CM | POA: Diagnosis not present

## 2024-06-20 DIAGNOSIS — F411 Generalized anxiety disorder: Secondary | ICD-10-CM | POA: Diagnosis not present

## 2024-06-26 DIAGNOSIS — S93492A Sprain of other ligament of left ankle, initial encounter: Secondary | ICD-10-CM | POA: Diagnosis not present

## 2024-07-04 DIAGNOSIS — S93432D Sprain of tibiofibular ligament of left ankle, subsequent encounter: Secondary | ICD-10-CM | POA: Diagnosis not present

## 2024-07-11 DIAGNOSIS — F411 Generalized anxiety disorder: Secondary | ICD-10-CM | POA: Diagnosis not present

## 2024-07-16 ENCOUNTER — Other Ambulatory Visit: Payer: Self-pay | Admitting: Nurse Practitioner

## 2024-07-16 DIAGNOSIS — C50412 Malignant neoplasm of upper-outer quadrant of left female breast: Secondary | ICD-10-CM

## 2024-07-16 NOTE — Progress Notes (Unsigned)
 [facility]   Telephone:([phone]) [phone] Fax:(336) [phone]    Patient Care Team: Regino Slater, MD as PCP - General (Family Medicine) Lanny Callander, MD as Consulting Physician (Hematology) Dewey Rush, MD as Consulting Physician (Radiation Oncology) Mammography, [facility] as Radiologist (Diagnostic Radiology)   CHIEF COMPLAINT: Follow up left breast cancer   Oncology History Overview Note  Breast cancer of upper-outer quadrant of left female breast [facility])   Staging form: Breast, AJCC 7th Edition   - Clinical stage from 04/15/2016: Stage IA (T1b, N0, M0) - Unsigned         Staging comments: Staged at breast conference on 7.5.17   - Pathologic stage from 04/22/2016: Stage IA (T1c, N0, cM0) - Unsigned      Breast cancer of upper-outer quadrant of left female breast (HCC)  04/02/2016 Mammogram   1 cm oval massin the upper outer quadrant of left breast, suspicious for malignancy.    04/08/2016 Initial Diagnosis   Breast cancer of upper-outer quadrant of left female breast (HCC)   04/08/2016 Initial Biopsy   Left breast core needle biopsy showed invasive ductal carcinoma and DCIS, grade 1-2.   04/08/2016 Receptors her2   Your 100% positive, strong staining, PR 70% positive, strong staining, HER-2 negative, Ki-67 15%   04/22/2016 Surgery   Left breast lumpectomy and SLN biopsy ([facility])   04/22/2016 Pathology Results   Left breast lumpectomy showed invasive ductal carcinoma, grade 1, 1.2 cm, low-grade DCIS, surgical margins were negative, 3 sentinel lymph nodes negative.   04/22/2016 Oncotype testing   RS 17, low risk, predicts 10 year distant recurrence risk of 11% with tamoxifen    05/26/2016 - 07/13/2016 Radiation Therapy   Adjuvant breast radiation [facility]): Left Breast/ 50.4 Gy in 28 fx.  Boost/ 10 Gy in 5 fx   07/2016 -  Anti-estrogen oral therapy   Letrozole  2.5 mg daily. Planned duration of therapy: 5-10 years.  Switched to Tamoxifen  from 09/2018-12/2018 due to  osteporosis, but due to poor tolerance we switched her to Letrozole  again in 12/2018.    08/27/2016 Imaging   DEXA scan: Osteopenia. (T-score -2.4).    11/22/2017 Pathology Results   Diagnosis 11/22/17 Breast, left, needle core biopsy - DENSE STROMAL FIBROSIS, CONSISTENT WITH PRIOR SURGICAL PROCEDURE. - THERE IS NO EVIDENCE OF MALIGNANCY. - SEE COMMENT.   05/24/2020 Genetic Testing   Negative genetic testing:  No pathogenic variants detected on the [facility] Common Hereditary Cancers Panel. The report date is 05/24/2020.   The Common Hereditary Cancers Panel offered by [facility] includes sequencing and/or deletion duplication testing of the following 48 genes: APC, ATM, AXIN2, BARD1, BMPR1A, BRCA1, BRCA2, BRIP1, CDH1, CDK4, CDKN2A (p14ARF), CDKN2A (p16INK4a), CHEK2, CTNNA1, DICER1, EPCAM (Deletion/duplication testing only), GREM1 (promoter region deletion/duplication testing only), KIT, MEN1, MLH1, MSH2, MSH3, MSH6, MUTYH, NBN, NF1, NTHL1, PALB2, PDGFRA, PMS2, POLD1, POLE, PTEN, RAD50, RAD51C, RAD51D, RNF43, SDHB, SDHC, SDHD, SMAD4, SMARCA4. STK11, TP53, TSC1, TSC2, and VHL.  The following genes were evaluated for sequence changes only: SDHA and HOXB13 c.251G>A variant only.      CURRENT THERAPY:  Letrozole  2.5 mg daily started in 07/2016. Switched to Tamoxifen  from 09/2018-12/2018 due to osteoporosis, but due to poor tolerance we switched her to Letrozole  again in 12/2018. Total 7 years to complete 07/2023   INTERVAL HISTORY Tiffany Nash returns for follow up as scheduled. Doing well overall. Denies breast concerns. Feels better off anti-estrogen, sleeping better, more clear headed, but still runs hot. Gets breast exams with ob/gyn.  ROS  All other systems reviewed and negative  Past Medical History:  Diagnosis Date   Abnormal Pap smear    Breast cancer (HCC)    Elevated hemoglobin A1c June 2017   5.9   Family history of pancreatic cancer    GERD (gastroesophageal reflux disease)     Migraine    uses imitrex  as needed   Osteopenia    Osteoporosis    Schatzki's ring    and hiatal hernia on EGD   Shingles rash 07/2015    shingles on right rib cage    TMJ (temporomandibular joint syndrome)    Vertigo      Past Surgical History:  Procedure Laterality Date   BIOPSY  08/27/2023   Procedure: BIOPSY;  Surgeon: Elicia Claw, MD;  Location: [facility];  Service: Gastroenterology;;   BREAST BIOPSY  1/12   fibrocystic changes    BREAST LUMPECTOMY WITH RADIOACTIVE SEED AND SENTINEL LYMPH NODE BIOPSY Left 04/22/2016   Procedure: BREAST LUMPECTOMY WITH RADIOACTIVE SEED AND SENTINEL LYMPH NODE BIOPSY;  Surgeon: Morene Olives, MD;  Location: [facility];  Service: General;  Laterality: Left;   DILATION AND CURETTAGE OF UTERUS  1991   ESOPHAGEAL DILATION     ESOPHAGOGASTRODUODENOSCOPY (EGD) WITH PROPOFOL  N/A 08/27/2023   Procedure: ESOPHAGOGASTRODUODENOSCOPY (EGD) WITH PROPOFOL ;  Surgeon: Elicia Claw, MD;  Location: [facility];  Service: Gastroenterology;  Laterality: N/A;   GYNECOLOGIC CRYOSURGERY  1980s   LAPAROSCOPIC CHOLECYSTECTOMY  1992   WISDOM TOOTH EXTRACTION       Outpatient Encounter Medications as of 07/18/2024  Medication Sig   acetaminophen  (TYLENOL ) 500 MG tablet Take 500 mg by mouth as needed for mild pain (pain score 1-3).   acyclovir  ointment (ZOVIRAX ) 5 % Apply 1 Application topically every 3 (three) hours.   Calcium-Magnesium-Vitamin D  600-40-500 MG-MG-UNIT TB24 Take 2 capsules by mouth once a week. Take on Wednesday   ondansetron  (ZOFRAN -ODT) 4 MG disintegrating tablet Take 1 tablet (4 mg total) by mouth every 8 (eight) hours as needed for nausea or vomiting.   SUMAtriptan  (IMITREX ) 100 MG tablet May repeat in 2 hours if headache persists or recurs.   triamcinolone  cream (KENALOG ) 0.1 % Apply 1 Application topically 2 (two) times daily.   valACYclovir  (VALTREX ) 500 MG tablet Take 1 tablet (500 mg total) by mouth 2 (two) times  daily. Take for 3 days with symptoms.   No facility-administered encounter medications on file as of 07/18/2024.     Today's Vitals   07/18/24 1055 07/18/24 1100  BP: 120/70   Pulse: 68   Resp: 17   Temp: 97.8 F (36.6 C)   SpO2: 98%   Weight: 154 lb 3.2 oz (69.9 kg)   PainSc:  0-No pain   Body mass index is 24.89 kg/m.   ECOG PERFORMANCE STATUS: 0 - Asymptomatic  PHYSICAL EXAM GENERAL:alert, no distress and comfortable SKIN: no rash  EYES: sclera clear NECK: without mass LYMPH:  no palpable cervical or supraclavicular lymphadenopathy  LUNGS: normal breathing effort HEART: no lower extremity edema ABDOMEN: abdomen soft, non-tender and normal bowel sounds NEURO: alert & oriented x 3 with fluent speech, no focal motor/sensory deficits Breast exam: no nipple discharge or inversion, s/p left lumpectomy, incisions completely healed. No palpable mass or nodularity in either breast or axilla that I could appreciate   CBC    Latest Ref Rng & Units 07/18/2024   10:43 AM 08/31/2023    7:05 AM 08/30/2023    6:33 AM  CBC  WBC 4.0 - 10.5 K/uL 5.6  8.9  10.4   Hemoglobin 12.0 - 15.0 g/dL 87.5  9.9  9.9   Hematocrit 36.0 - 46.0 % 36.5  29.8  30.0   Platelets 150 - 400 K/uL 257  264  218       CMP     Latest Ref Rng & Units 07/18/2024   10:43 AM 08/31/2023    7:05 AM 08/30/2023    6:33 AM  CMP  Glucose 70 - 99 mg/dL 95  871  854   BUN 8 - 23 mg/dL 16  <5  <5   Creatinine 0.44 - 1.00 mg/dL 9.32  9.44  9.42   Sodium 135 - 145 mmol/L 139  136  134   Potassium 3.5 - 5.1 mmol/L 4.0  3.5  3.3   Chloride 98 - 111 mmol/L 107  103  104   CO2 22 - 32 mmol/L 27  26  22    Calcium 8.9 - 10.3 mg/dL 9.7  8.4  7.8   Total Protein 6.5 - 8.1 g/dL 7.4  5.4    Total Bilirubin 0.0 - 1.2 mg/dL 0.4  0.6    Alkaline Phos 38 - 126 U/L 64  133    AST 15 - 41 U/L 14  17    ALT 0 - 44 U/L 16  76        ASSESSMENT & PLAN: Tiffany Nash is a 63 y.o. female with    1. Breast cancer of  lower-outer quadrant of left female breast, invasive and in situ ductal carcinoma, grade 1, pT1cN0M0, stage IA, 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 S/p left breast lumpectomy and adjuvant radiation. Oncotype RS showed low risk, 17, which predicts 10 year distant recurrence after 5 years of tamoxifen  11%. Chemotherapy was not recommended  -She started anti-estrogen therapy with letrozole  in 07/2016. Due to Osteoporosis she was switched to Tamoxifen  in 09/2018 but did not tolerate well due to insomnia and vaginal discharge. Switched back to Letrozole  in 12/2018.   Completed 7 years in 07/2023 -She has breast density category C, her initial breast cancer in 03/2016 was diagnosed 3 months after a negative mammogram, she is interested in additional breast screening MRI which was approved and scheduled in 01/2020 but patient did not show. -Ms. Wideman is clinically doing well, exam is benign, labs are normal, overall no clinical concern for recurrence. Continue breast cancer surveillance -I offered her to continue annual f/up vs discharge back to PCP/gyn for annual breast exams, she agreed to discharge but knows she can call any time if needed   2. Osteoporosis -Her 08/2018 DEXA from [place] shows osteoporosis with Lowest T-score of -2.8 at [facility].  -She tried Zometa  on 01/02/19 but had severe flu like reaction with fever lasting 2 weeks. This was discontinued   -continue weight bearing exercises and daily calcium and vitamin D     3. Family history  -she updated me today that one of her brothers and MGF had pancreatic cancer, and one of her sisters had a pancreatic mass that was benign.  -Pt has 4 children, and she is 1 of 5 siblings. reviewed current screening guidelines -She underwent genetic testing by [facility] Common hereditary cancers panel, report on 05/24/2020 was negative for pathogenic variants   4.  Insomnia -Pre-existing, worsened after cancer diagnosis and  tamoxifen .  He tried Lunesta  and melatonin in the past which were not helpful -Currently alternating trazodone , Benadryl,  and NyQuil; she knows not to take them together   PLAN: -Labs reviewed -Continue breast cancer surveillance with PCP/gyn -Annual mammograms in January -F/up open, if needed in the future    Orders Placed This Encounter  Procedures   MM 3D SCREENING MAMMOGRAM BILATERAL BREAST    Standing Status:   Future    Expected Date:   10/14/2024    Expiration Date:   07/18/2025    Scheduling Instructions:     [facility]    Reason for Exam (SYMPTOM  OR DIAGNOSIS REQUIRED):   h/o left breast cancer 2017    Preferred imaging location?:   External      All questions were answered. The patient knows to call the clinic with any problems, questions or concerns. No barriers to learning were detected.  Jadrian Bulman K Eavan Gonterman, NP 07/18/2024

## 2024-07-18 ENCOUNTER — Encounter: Payer: Self-pay | Admitting: Nurse Practitioner

## 2024-07-18 ENCOUNTER — Inpatient Hospital Stay: Payer: BC Managed Care – PPO | Admitting: Nurse Practitioner

## 2024-07-18 ENCOUNTER — Inpatient Hospital Stay: Payer: BC Managed Care – PPO | Attending: Hematology

## 2024-07-18 VITALS — BP 120/70 | HR 68 | Temp 97.8°F | Resp 17 | Wt 154.2 lb

## 2024-07-18 DIAGNOSIS — Z9049 Acquired absence of other specified parts of digestive tract: Secondary | ICD-10-CM | POA: Diagnosis not present

## 2024-07-18 DIAGNOSIS — N898 Other specified noninflammatory disorders of vagina: Secondary | ICD-10-CM | POA: Insufficient documentation

## 2024-07-18 DIAGNOSIS — Z79899 Other long term (current) drug therapy: Secondary | ICD-10-CM | POA: Diagnosis not present

## 2024-07-18 DIAGNOSIS — M810 Age-related osteoporosis without current pathological fracture: Secondary | ICD-10-CM | POA: Diagnosis not present

## 2024-07-18 DIAGNOSIS — B029 Zoster without complications: Secondary | ICD-10-CM | POA: Diagnosis not present

## 2024-07-18 DIAGNOSIS — Z1231 Encounter for screening mammogram for malignant neoplasm of breast: Secondary | ICD-10-CM | POA: Diagnosis not present

## 2024-07-18 DIAGNOSIS — Z170 Estrogen receptor positive status [ER+]: Secondary | ICD-10-CM

## 2024-07-18 DIAGNOSIS — G4700 Insomnia, unspecified: Secondary | ICD-10-CM | POA: Diagnosis not present

## 2024-07-18 DIAGNOSIS — C50412 Malignant neoplasm of upper-outer quadrant of left female breast: Secondary | ICD-10-CM | POA: Diagnosis not present

## 2024-07-18 DIAGNOSIS — Z800 Family history of malignant neoplasm of digestive organs: Secondary | ICD-10-CM | POA: Insufficient documentation

## 2024-07-18 DIAGNOSIS — Z1721 Progesterone receptor positive status: Secondary | ICD-10-CM | POA: Diagnosis not present

## 2024-07-18 LAB — CMP (CANCER CENTER ONLY)
ALT: 16 U/L (ref 0–44)
AST: 14 U/L — ABNORMAL LOW (ref 15–41)
Albumin: 4.4 g/dL (ref 3.5–5.0)
Alkaline Phosphatase: 64 U/L (ref 38–126)
Anion gap: 5 (ref 5–15)
BUN: 16 mg/dL (ref 8–23)
CO2: 27 mmol/L (ref 22–32)
Calcium: 9.7 mg/dL (ref 8.9–10.3)
Chloride: 107 mmol/L (ref 98–111)
Creatinine: 0.67 mg/dL (ref 0.44–1.00)
GFR, Estimated: >60 mL/min (ref >60)
Glucose, Bld: 95 mg/dL (ref 70–99)
Potassium: 4 mmol/L (ref 3.5–5.1)
Sodium: 139 mmol/L (ref 135–145)
Total Bilirubin: 0.4 mg/dL (ref 0.0–1.2)
Total Protein: 7.4 g/dL (ref 6.5–8.1)

## 2024-07-18 LAB — CBC WITH DIFFERENTIAL (CANCER CENTER ONLY)
Abs Immature Granulocytes: 0.01 K/uL (ref 0.00–0.07)
Basophils Absolute: 0.1 K/uL (ref 0.0–0.1)
Basophils Relative: 1 %
Eosinophils Absolute: 0.3 K/uL (ref 0.0–0.5)
Eosinophils Relative: 6 %
HCT: 36.5 % (ref 36.0–46.0)
Hemoglobin: 12.4 g/dL (ref 12.0–15.0)
Immature Granulocytes: 0 %
Lymphocytes Relative: 41 %
Lymphs Abs: 2.3 K/uL (ref 0.7–4.0)
MCH: 31.6 pg (ref 26.0–34.0)
MCHC: 34 g/dL (ref 30.0–36.0)
MCV: 92.9 fL (ref 80.0–100.0)
Monocytes Absolute: 0.4 K/uL (ref 0.1–1.0)
Monocytes Relative: 7 %
Neutro Abs: 2.5 K/uL (ref 1.7–7.7)
Neutrophils Relative %: 45 %
Platelet Count: 257 K/uL (ref 150–400)
RBC: 3.93 MIL/uL (ref 3.87–5.11)
RDW: 12.7 % (ref 11.5–15.5)
WBC Count: 5.6 K/uL (ref 4.0–10.5)
nRBC: 0 % (ref 0.0–0.2)

## 2024-07-19 DIAGNOSIS — M6281 Muscle weakness (generalized): Secondary | ICD-10-CM | POA: Diagnosis not present

## 2024-07-19 DIAGNOSIS — S93432D Sprain of tibiofibular ligament of left ankle, subsequent encounter: Secondary | ICD-10-CM | POA: Diagnosis not present

## 2024-07-19 DIAGNOSIS — S92155D Nondisplaced avulsion fracture (chip fracture) of left talus, subsequent encounter for fracture with routine healing: Secondary | ICD-10-CM | POA: Diagnosis not present

## 2024-07-19 DIAGNOSIS — R262 Difficulty in walking, not elsewhere classified: Secondary | ICD-10-CM | POA: Diagnosis not present

## 2024-08-21 DIAGNOSIS — L237 Allergic contact dermatitis due to plants, except food: Secondary | ICD-10-CM | POA: Diagnosis not present

## 2024-08-29 DIAGNOSIS — L237 Allergic contact dermatitis due to plants, except food: Secondary | ICD-10-CM | POA: Diagnosis not present
